# Patient Record
Sex: Female | Born: 1940 | Race: White | Hispanic: No | Smoking: Former smoker
Health system: Southern US, Community
[De-identification: ages and names within clinical notes are randomized; demographics above are authoritative.]

## PROBLEM LIST (undated history)

## (undated) DIAGNOSIS — E041 Nontoxic single thyroid nodule: Secondary | ICD-10-CM

## (undated) DIAGNOSIS — E785 Hyperlipidemia, unspecified: Secondary | ICD-10-CM

## (undated) DIAGNOSIS — K2210 Ulcer of esophagus without bleeding: Secondary | ICD-10-CM

## (undated) DIAGNOSIS — M1990 Unspecified osteoarthritis, unspecified site: Secondary | ICD-10-CM

## (undated) DIAGNOSIS — Z8679 Personal history of other diseases of the circulatory system: Secondary | ICD-10-CM

## (undated) DIAGNOSIS — I2510 Atherosclerotic heart disease of native coronary artery without angina pectoris: Secondary | ICD-10-CM

## (undated) DIAGNOSIS — M549 Dorsalgia, unspecified: Secondary | ICD-10-CM

## (undated) DIAGNOSIS — Z860100 Personal history of colon polyps, unspecified: Secondary | ICD-10-CM

## (undated) DIAGNOSIS — C4490 Unspecified malignant neoplasm of skin, unspecified: Secondary | ICD-10-CM

## (undated) DIAGNOSIS — I340 Nonrheumatic mitral (valve) insufficiency: Secondary | ICD-10-CM

## (undated) DIAGNOSIS — I509 Heart failure, unspecified: Secondary | ICD-10-CM

## (undated) DIAGNOSIS — K219 Gastro-esophageal reflux disease without esophagitis: Secondary | ICD-10-CM

## (undated) DIAGNOSIS — N393 Stress incontinence (female) (male): Secondary | ICD-10-CM

## (undated) DIAGNOSIS — N189 Chronic kidney disease, unspecified: Secondary | ICD-10-CM

## (undated) DIAGNOSIS — Z9889 Other specified postprocedural states: Secondary | ICD-10-CM

## (undated) DIAGNOSIS — I219 Acute myocardial infarction, unspecified: Secondary | ICD-10-CM

## (undated) DIAGNOSIS — R0600 Dyspnea, unspecified: Secondary | ICD-10-CM

## (undated) DIAGNOSIS — K52839 Microscopic colitis, unspecified: Secondary | ICD-10-CM

## (undated) DIAGNOSIS — C801 Malignant (primary) neoplasm, unspecified: Secondary | ICD-10-CM

## (undated) DIAGNOSIS — K760 Fatty (change of) liver, not elsewhere classified: Secondary | ICD-10-CM

## (undated) DIAGNOSIS — R002 Palpitations: Secondary | ICD-10-CM

## (undated) DIAGNOSIS — C189 Malignant neoplasm of colon, unspecified: Secondary | ICD-10-CM

## (undated) DIAGNOSIS — R112 Nausea with vomiting, unspecified: Secondary | ICD-10-CM

## (undated) DIAGNOSIS — G8929 Other chronic pain: Secondary | ICD-10-CM

## (undated) HISTORY — PX: APPENDECTOMY: SHX54

## (undated) HISTORY — PX: BIOPSY THYROID: PRO38

## (undated) HISTORY — PX: COLONOSCOPY: SHX174

## (undated) HISTORY — PX: CARDIAC CATHETERIZATION: SHX172

## (undated) HISTORY — PX: ESOPHAGOGASTRODUODENOSCOPY: SHX1529

## (undated) HISTORY — PX: CORONARY ANGIOPLASTY: SHX604

---

## 1987-04-20 DIAGNOSIS — I219 Acute myocardial infarction, unspecified: Secondary | ICD-10-CM

## 1987-04-20 HISTORY — PX: ROTATOR CUFF REPAIR: SHX139

## 1987-04-20 HISTORY — DX: Acute myocardial infarction, unspecified: I21.9

## 1988-04-19 HISTORY — PX: ANKLE SURGERY: SHX546

## 1999-04-20 HISTORY — PX: ROTATOR CUFF REPAIR: SHX139

## 2002-04-19 HISTORY — PX: CARPAL TUNNEL RELEASE: SHX101

## 2006-04-19 HISTORY — PX: OTHER SURGICAL HISTORY: SHX169

## 2009-04-19 HISTORY — PX: BACK SURGERY: SHX140

## 2010-02-26 ENCOUNTER — Encounter: Admission: RE | Admit: 2010-02-26 | Discharge: 2010-02-26 | Payer: Self-pay | Admitting: Neurosurgery

## 2010-03-04 ENCOUNTER — Inpatient Hospital Stay (HOSPITAL_COMMUNITY): Admission: RE | Admit: 2010-03-04 | Discharge: 2010-03-05 | Payer: Self-pay | Admitting: Neurosurgery

## 2010-04-19 HISTORY — PX: TOTAL ANKLE REPLACEMENT: SUR1218

## 2010-04-19 HISTORY — PX: SPINAL FUSION: SHX223

## 2010-04-23 ENCOUNTER — Encounter
Admission: RE | Admit: 2010-04-23 | Discharge: 2010-04-23 | Payer: Self-pay | Source: Home / Self Care | Attending: Neurosurgery | Admitting: Neurosurgery

## 2010-06-24 ENCOUNTER — Encounter (HOSPITAL_COMMUNITY)
Admission: RE | Admit: 2010-06-24 | Discharge: 2010-06-24 | Disposition: A | Discharge status: Home / Self Care | Payer: Medicare Other | Source: Ambulatory Visit | Attending: Neurosurgery | Admitting: Neurosurgery

## 2010-06-24 LAB — CBC
HCT: 43 % (ref 36.0–46.0)
MCHC: 33.3 g/dL (ref 30.0–36.0)
MCV: 90.7 fL (ref 78.0–100.0)
RDW: 13.9 % (ref 11.5–15.5)

## 2010-06-24 LAB — COMPREHENSIVE METABOLIC PANEL
Alkaline Phosphatase: 89 U/L (ref 39–117)
BUN: 21 mg/dL (ref 6–23)
Calcium: 9.6 mg/dL (ref 8.4–10.5)
Glucose, Bld: 100 mg/dL — ABNORMAL HIGH (ref 70–99)
Total Protein: 6.8 g/dL (ref 6.0–8.3)

## 2010-06-24 LAB — SURGICAL PCR SCREEN
MRSA, PCR: NEGATIVE
Staphylococcus aureus: NEGATIVE

## 2010-06-26 ENCOUNTER — Inpatient Hospital Stay (HOSPITAL_COMMUNITY): Payer: Medicare Other

## 2010-06-26 ENCOUNTER — Inpatient Hospital Stay (HOSPITAL_COMMUNITY)
Admission: RE | Admit: 2010-06-26 | Discharge: 2010-07-06 | DRG: 460 | Disposition: A | Discharge status: Home Health | Payer: Medicare Other | Source: Ambulatory Visit | Attending: Neurosurgery | Admitting: Neurosurgery

## 2010-06-26 DIAGNOSIS — T84498A Other mechanical complication of other internal orthopedic devices, implants and grafts, initial encounter: Principal | ICD-10-CM | POA: Diagnosis present

## 2010-06-26 DIAGNOSIS — Z01818 Encounter for other preprocedural examination: Secondary | ICD-10-CM

## 2010-06-26 DIAGNOSIS — Y831 Surgical operation with implant of artificial internal device as the cause of abnormal reaction of the patient, or of later complication, without mention of misadventure at the time of the procedure: Secondary | ICD-10-CM | POA: Diagnosis present

## 2010-06-26 DIAGNOSIS — M51379 Other intervertebral disc degeneration, lumbosacral region without mention of lumbar back pain or lower extremity pain: Secondary | ICD-10-CM | POA: Diagnosis present

## 2010-06-26 DIAGNOSIS — Z01812 Encounter for preprocedural laboratory examination: Secondary | ICD-10-CM

## 2010-06-26 DIAGNOSIS — M5137 Other intervertebral disc degeneration, lumbosacral region: Secondary | ICD-10-CM | POA: Diagnosis present

## 2010-06-28 ENCOUNTER — Inpatient Hospital Stay (HOSPITAL_COMMUNITY): Payer: Medicare Other

## 2010-06-30 LAB — CBC
HCT: 41.5 % (ref 36.0–46.0)
RDW: 13.9 % (ref 11.5–15.5)
WBC: 7 10*3/uL (ref 4.0–10.5)

## 2010-06-30 LAB — URINALYSIS, ROUTINE W REFLEX MICROSCOPIC
Ketones, ur: NEGATIVE mg/dL
Nitrite: NEGATIVE
Protein, ur: NEGATIVE mg/dL
Urobilinogen, UA: 0.2 mg/dL (ref 0.0–1.0)
pH: 6 (ref 5.0–8.0)

## 2010-06-30 LAB — BASIC METABOLIC PANEL
BUN: 12 mg/dL (ref 6–23)
Calcium: 9.2 mg/dL (ref 8.4–10.5)
GFR calc non Af Amer: 54 mL/min — ABNORMAL LOW (ref >60)
Potassium: 4.6 mEq/L (ref 3.5–5.1)
Sodium: 137 mEq/L (ref 135–145)

## 2010-06-30 LAB — SURGICAL PCR SCREEN
MRSA, PCR: NEGATIVE
Staphylococcus aureus: NEGATIVE

## 2010-07-09 NOTE — Op Note (Signed)
NAMEYURIKO, Cooke NO.:  [id number]  MEDICAL RECORD NO.:  [id number]           PATIENT TYPE:  I  LOCATION:  3020                         FACILITY:  [facility]  PHYSICIAN:  Coletta Memos, M.D.     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  PROCEDURE:  Right redo laminectomy and diskectomy L4-5, unintended durotomy, repaired primarily.  FINDINGS:  Very degenerated disk more than likely unstable disk space. Discussed with family member about intraoperative fusion which was my recommendation.  Her son felt that she will one to make that decision on her own which was also perfectly fine.  OPERATIVE NOTE:  Erin Cooke was brought to the operating room, intubated, and placed under general anesthetic.  She was rolled prone onto a Wilson frame and all pressure points were properly padded.  Her back was prepped.  She was draped in a sterile fashion.  I opened the old incision and took this down to the thoracolumbar fascia.  I then exposed the lamina of what I believed to be my incision.  I then placed a double-ended ganglion knife inferior to the lamina for localization purposes, and after the x-ray was performed I removed the double-ended ganglion knife and spinal fluid was emanating from that opening in the scar tissue.  As I had not yet obtained exposure, I then just went superiorly on the lamina, removed more material.  I was then able to identify the ligamentum flavum.  I then also did remove the spinous process and performed was very small amount of laminectomy to gain better exposure for the repair.  With Dr. Fredrich Birks assistance, removed that and then the ligamentum flavum, and saw the hole in the dura.  I was able to repair that with two  Prolene sutures without difficulty.  We then retracted the thecal sac, identified the disk space, and removed a great deal of disk material, but it was obvious at that point just from my manipulation of the disk space that she was quite unstable.  The disk was so degenerated as it was a first time.  We removed whenever disk was remaining.  Was at great risk for recurrent herniation.  At that point, Dr. Venetia Maxon remained in the room and I broke scrub.  I spoke to her son and expressed my feeling that she should be fused in order to prevent another recurrence.  She had an early recurrence at this time.  He felt that she really would want to make that decision for herself and that is why we spoke to him, so I then came back to the room, and after full decompression then closed the wound in layered fashion.  I placed DuraSeal.  I then reapproximated the thoracolumbar subcutaneous and subcuticular layers with Vicryls and the skin edges with a 3-0 nylon suture.  Sterile dressing was applied.         ______________________________ Coletta Memos, M.D.  KC/MEDQ  D:  06/26/2010  T:  06/27/2010  Job:  [id number]  Electronically Signed by Coletta Memos M.D. on 07/09/2010 02:36:57 PM

## 2010-07-15 NOTE — Op Note (Signed)
Erin Cooke, Erin Cooke NO.:  [id number]  MEDICAL RECORD NO.:  [id number]           PATIENT TYPE:  I  LOCATION:  3020                         FACILITY:  [facility]  PHYSICIAN:  Coletta Memos, M.D.     DATE OF BIRTH:  09-18-1940  DATE OF PROCEDURE:  06/28/2010 DATE OF DISCHARGE:  07/06/2010                              OPERATIVE REPORT   PREOPERATIVE DIAGNOSES:  Lumbar instability, lumbar spondylosis without myelopathy, lumbar degenerative disk disease, and recurrent disk herniation.  POSTOPERATIVE DIAGNOSES:  Lumbar instability, lumbar spondylosis without myelopathy, lumbar degenerative disk disease, and recurrent disk herniation.  PROCEDURE:  Stage II of a lumbar fusion.  She underwent: 1. Posterior lumbar interbody arthrodesis, L4-5. 2. Posterolateral arthrodesis L4-5 morselized allograft. 3. Nonsegmental pedicle screw instrumentation with Medtronic I believe     CD Horizon.  OPERATIVE NOTE:  [name] is having a planned stage II procedure. While in the operating room on June 26, 2010, I felt that at that time that a fusion would be in her best interest.  The disk was markedly degenerated, significant amount of laxity in the facets and I just thought that she needed to be fused.  We, however, had not spoken about that during that time.  I went to ask her son though obviously on the surgical consent I have authorization to do what I believe is necessary. The sons requested that I have [name] be awakened so that she could make a decision about a fusion on her own.  I honored their request anticipating that she would.  This is a stage II of this operation.  [name] was brought to the operating room.  She was intubated and placed under general anesthetic without difficulty.  She was rolled prone onto a Wilson frame and all pressure points were properly padded. Her back was prepped and I opened the old incision.  I took this down to the thoracolumbar  fascia, then exposed lamina of L4 and L5 bilaterally and along the transverse processes bilaterally with Dr. Fredrich Birks assistance.  I completed the diskectomy and placed a PEEK interbody cage 12 x 24 Synthes cage into the disk space.  After placement of the cage with Dr. Fredrich Birks assistance, we then placed pedicle screws, 2 screws in L4 and 2 screws in L5.  The right-sided L5 screw is slightly lateral but appeared to be within the vertebral body.  I did not believe it was worthwhile to take the screw out so I therefore connected it.  She had good purchase with all 4 screws.  X-ray showed screws in the interbody to be in good position.  I then irrigated.  I then placed more DuraSeal over the previous durotomy opening that had been primarily repaired.  I then closed the wound in layered fashion using Vicryl sutures to reapproximate the thoracolumbar subcutaneous layers.  I used a 3-0 nylon to reapproximate the skin edges with interrupted vertical mattress sutures.          ______________________________ Coletta Memos, M.D.     KC/MEDQ  D:  07/13/2010  T:  07/14/2010  Job:  [id number]  Electronically Signed by  Coletta Memos M.D. on 07/15/2010 03:45:48 PM

## 2010-08-05 NOTE — Discharge Summary (Signed)
  [name], [name] NO.:  [id number]  MEDICAL RECORD NO.:  [id number]           PATIENT TYPE:  I  LOCATION:  3020                         FACILITY:  [facility]  PHYSICIAN:  Coletta Memos, M.D.     DATE OF BIRTH:  1940/06/15  DATE OF ADMISSION:  06/26/2010 DATE OF DISCHARGE:  07/06/2010                              DISCHARGE SUMMARY   ADMITTING DIAGNOSIS:  Recurrent displaced disk L4-5.  PROCEDURE:  Posterior lumbar interbody arthrodesis L4-5 with morselized autograft, pedicle screw instrumentation.  This was a planned stage procedure after the first operation because I felt that spine was not stable.  I will not discuss this with the patient so we needed to wake her up and then she underwent the procedure.  DISCHARGE STATUS:  Alive and well.  DISCHARGE DESTINATION:  Home.  WOUND:  Still had slight amount of drainage.  A sterile dressing was applied in the form of Dermabond after a stitch was placed during her hospitalization.  She did very well after her procedure and I will see her in 3-4 weeks.  She was given instructions of no heavy lifting, bending or twisting.  There were no signs of infection in the wound though I did discharge her with antibiotic.  She was voiding and ambulating and tolerating a regular diet.  I will see her again in 3-4 weeks.          ______________________________ Coletta Memos, M.D.     KC/MEDQ  D:  07/23/2010  T:  07/24/2010  Job:  [id number]  Electronically Signed by Coletta Memos M.D. on 08/05/2010 04:13:25 PM

## 2011-04-20 HISTORY — PX: MASTECTOMY, PARTIAL: SHX709

## 2012-04-19 HISTORY — PX: DILATION AND CURETTAGE OF UTERUS: SHX78

## 2012-04-19 HISTORY — PX: OTHER SURGICAL HISTORY: SHX169

## 2013-09-18 ENCOUNTER — Other Ambulatory Visit: Payer: Self-pay | Admitting: Neurosurgery

## 2013-10-03 ENCOUNTER — Encounter (HOSPITAL_COMMUNITY)
Admission: RE | Admit: 2013-10-03 | Discharge: 2013-10-03 | Disposition: A | Discharge status: Home / Self Care | Payer: Medicare Other | Source: Ambulatory Visit | Attending: Neurosurgery | Admitting: Neurosurgery

## 2013-10-03 ENCOUNTER — Encounter (HOSPITAL_COMMUNITY)
Admission: RE | Admit: 2013-10-03 | Discharge: 2013-10-03 | Disposition: A | Discharge status: Home / Self Care | Payer: Medicare Other | Source: Ambulatory Visit | Attending: Anesthesiology | Admitting: Anesthesiology

## 2013-10-03 ENCOUNTER — Encounter (HOSPITAL_COMMUNITY): Payer: Self-pay

## 2013-10-03 DIAGNOSIS — Z01818 Encounter for other preprocedural examination: Secondary | ICD-10-CM | POA: Insufficient documentation

## 2013-10-03 DIAGNOSIS — Z01812 Encounter for preprocedural laboratory examination: Secondary | ICD-10-CM | POA: Insufficient documentation

## 2013-10-03 HISTORY — DX: Ulcer of esophagus without bleeding: K22.10

## 2013-10-03 HISTORY — DX: Nausea with vomiting, unspecified: R11.2

## 2013-10-03 HISTORY — DX: Other specified postprocedural states: Z98.890

## 2013-10-03 HISTORY — DX: Unspecified osteoarthritis, unspecified site: M19.90

## 2013-10-03 HISTORY — DX: Stress incontinence (female) (male): N39.3

## 2013-10-03 HISTORY — DX: Palpitations: R00.2

## 2013-10-03 HISTORY — DX: Atherosclerotic heart disease of native coronary artery without angina pectoris: I25.10

## 2013-10-03 HISTORY — DX: Personal history of colon polyps, unspecified: Z86.0100

## 2013-10-03 HISTORY — DX: Dorsalgia, unspecified: M54.9

## 2013-10-03 HISTORY — DX: Hyperlipidemia, unspecified: E78.5

## 2013-10-03 HISTORY — DX: Microscopic colitis, unspecified: K52.839

## 2013-10-03 HISTORY — DX: Gastro-esophageal reflux disease without esophagitis: K21.9

## 2013-10-03 HISTORY — DX: Personal history of colonic polyps: Z86.010

## 2013-10-03 HISTORY — DX: Acute myocardial infarction, unspecified: I21.9

## 2013-10-03 HISTORY — DX: Nontoxic single thyroid nodule: E04.1

## 2013-10-03 HISTORY — DX: Malignant (primary) neoplasm, unspecified: C80.1

## 2013-10-03 HISTORY — DX: Other chronic pain: G89.29

## 2013-10-03 LAB — BASIC METABOLIC PANEL
BUN: 19 mg/dL (ref 6–23)
CALCIUM: 9.4 mg/dL (ref 8.4–10.5)
CO2: 26 mEq/L (ref 19–32)
Chloride: 102 mEq/L (ref 96–112)
Creatinine, Ser: 0.95 mg/dL (ref 0.50–1.10)
GFR calc Af Amer: 66 mL/min — ABNORMAL LOW (ref >90)
GFR calc non Af Amer: 57 mL/min — ABNORMAL LOW (ref >90)
GLUCOSE: 89 mg/dL (ref 70–99)
Potassium: 4.4 mEq/L (ref 3.7–5.3)
Sodium: 141 mEq/L (ref 137–147)

## 2013-10-03 LAB — CBC
HCT: 40.2 % (ref 36.0–46.0)
HEMOGLOBIN: 12.8 g/dL (ref 12.0–15.0)
MCH: 28.1 pg (ref 26.0–34.0)
MCHC: 31.8 g/dL (ref 30.0–36.0)
MCV: 88.4 fL (ref 78.0–100.0)
Platelets: 217 10*3/uL (ref 150–400)
RBC: 4.55 MIL/uL (ref 3.87–5.11)
RDW: 14.8 % (ref 11.5–15.5)
WBC: 7.7 10*3/uL (ref 4.0–10.5)

## 2013-10-03 LAB — SURGICAL PCR SCREEN
MRSA, PCR: NEGATIVE
Staphylococcus aureus: POSITIVE — AB

## 2013-10-03 LAB — TYPE AND SCREEN
ABO/RH(D): O POS
Antibody Screen: NEGATIVE

## 2013-10-03 LAB — ABO/RH: ABO/RH(D): O POS

## 2013-10-03 NOTE — Progress Notes (Signed)
Cardiac protector takes Losartan but insists no HTN

## 2013-10-03 NOTE — Pre-Procedure Instructions (Addendum)
Erin Cooke  10/03/2013   Your procedure is scheduled on:  Wed, June 24 @ 9:00 AM  Report to [facility]  at 6:00 AM.  Call this number if you have problems the morning of surgery: [phone]   Remember:   Do not eat food or drink liquids after midnight.   Take these medicines the morning of surgery with A SIP OF WATER: Betapace(Sotalol),Eye Drops,and Omeprazole(Prilosec)   Stop taking your Aspirin.No Goody's,BC's,Aleve,Fish Oil,or any Herbal Medications   Do not wear jewelry, make-up or nail polish.  Do not wear lotions, powders, or perfumes. You may wear deodorant.  Do not shave 48 hours prior to surgery.   Do not bring valuables to the hospital.  [facility] is not responsible                  for any belongings or valuables.               Contacts, dentures or bridgework may not be worn into surgery.  Leave suitcase in the car. After surgery it may be brought to your room.  For patients admitted to the hospital, discharge time is determined by your                treatment team.                   Special Instructions:  [place] - Preparing for Surgery  Before surgery, you can play an important role.  Because skin is not sterile, your skin needs to be as free of germs as possible.  You can reduce the number of germs on you skin by washing with CHG (chlorahexidine gluconate) soap before surgery.  CHG is an antiseptic cleaner which kills germs and bonds with the skin to continue killing germs even after washing.  Please DO NOT use if you have an allergy to CHG or antibacterial soaps.  If your skin becomes reddened/irritated stop using the CHG and inform your nurse when you arrive at Short Stay.  Do not shave (including legs and underarms) for at least 48 hours prior to the first CHG shower.  You may shave your face.  Please follow these instructions carefully:   1.  Shower with CHG Soap the night before surgery and the                                morning  of Surgery.  2.  If you choose to wash your hair, wash your hair first as usual with your       normal shampoo.  3.  After you shampoo, rinse your hair and body thoroughly to remove the                      Shampoo.  4.  Use CHG as you would any other liquid soap.  You can apply chg directly       to the skin and wash gently with scrungie or a clean washcloth.  5.  Apply the CHG Soap to your body ONLY FROM THE NECK DOWN.        Do not use on open wounds or open sores.  Avoid contact with your eyes,       ears, mouth and genitals (private parts).  Wash genitals (private parts)       with your normal soap.  6.  Wash thoroughly, paying  special attention to the area where your surgery        will be performed.  7.  Thoroughly rinse your body with warm water from the neck down.  8.  DO NOT shower/wash with your normal soap after using and rinsing off       the CHG Soap.  9.  Pat yourself dry with a clean towel.            10.  Wear clean pajamas.            11.  Place clean sheets on your bed the night of your first shower and do not        sleep with pets.  Day of Surgery  Do not apply any lotions/deoderants the morning of surgery.  Please wear clean clothes to the hospital/surgery center.     Please read over the following fact sheets that you were given: Pain Booklet, Coughing and Deep Breathing, Blood Transfusion Information, MRSA Information and Surgical Site Infection Prevention

## 2013-10-03 NOTE — Progress Notes (Signed)
Dr.Zachary in [place] is cardiologist-to request visit from about 3wks ago  Heart cath done in 1999-[facility]  Stress test to be requesed from [place] from 2013 as well as echo  Pt states she sees Dr.Zachary for everything  Denies CXR in past yr

## 2013-10-09 MED ORDER — CEFAZOLIN SODIUM-DEXTROSE 2-3 GM-% IV SOLR
2.0000 g | INTRAVENOUS | Status: AC
  Administered 2013-10-10: 2 g via INTRAVENOUS

## 2013-10-10 ENCOUNTER — Inpatient Hospital Stay (HOSPITAL_COMMUNITY): Payer: Medicare Other

## 2013-10-10 ENCOUNTER — Encounter (HOSPITAL_COMMUNITY)
Admission: RE | Disposition: A | Discharge status: Home / Self Care | Payer: Self-pay | Source: Ambulatory Visit | Attending: Neurosurgery

## 2013-10-10 ENCOUNTER — Inpatient Hospital Stay (HOSPITAL_COMMUNITY)
Admission: RE | Admit: 2013-10-10 | Discharge: 2013-10-13 | DRG: 460 | Disposition: A | Discharge status: Home / Self Care | Payer: Medicare Other | Source: Ambulatory Visit | Attending: Neurosurgery | Admitting: Neurosurgery

## 2013-10-10 ENCOUNTER — Inpatient Hospital Stay (HOSPITAL_COMMUNITY): Payer: Medicare Other | Admitting: Certified Registered Nurse Anesthetist

## 2013-10-10 ENCOUNTER — Encounter (HOSPITAL_COMMUNITY): Payer: Medicare Other | Admitting: Certified Registered Nurse Anesthetist

## 2013-10-10 ENCOUNTER — Encounter (HOSPITAL_COMMUNITY): Payer: Self-pay | Admitting: Certified Registered Nurse Anesthetist

## 2013-10-10 DIAGNOSIS — M7138 Other bursal cyst, other site: Secondary | ICD-10-CM | POA: Diagnosis present

## 2013-10-10 DIAGNOSIS — Z6839 Body mass index (BMI) 39.0-39.9, adult: Secondary | ICD-10-CM

## 2013-10-10 DIAGNOSIS — M7130 Other bursal cyst, unspecified site: Principal | ICD-10-CM | POA: Diagnosis present

## 2013-10-10 DIAGNOSIS — I252 Old myocardial infarction: Secondary | ICD-10-CM

## 2013-10-10 DIAGNOSIS — M48061 Spinal stenosis, lumbar region without neurogenic claudication: Secondary | ICD-10-CM | POA: Diagnosis present

## 2013-10-10 DIAGNOSIS — I10 Essential (primary) hypertension: Secondary | ICD-10-CM | POA: Diagnosis present

## 2013-10-10 DIAGNOSIS — Z853 Personal history of malignant neoplasm of breast: Secondary | ICD-10-CM

## 2013-10-10 DIAGNOSIS — I2510 Atherosclerotic heart disease of native coronary artery without angina pectoris: Secondary | ICD-10-CM | POA: Diagnosis present

## 2013-10-10 SURGERY — POSTERIOR LUMBAR FUSION 1 LEVEL
Anesthesia: General | Site: Back

## 2013-10-10 MED ORDER — LIDOCAINE HCL (CARDIAC) 20 MG/ML IV SOLN
INTRAVENOUS | Status: AC
  Filled 2013-10-10: qty 5

## 2013-10-10 MED ORDER — LACTATED RINGERS IV SOLN
INTRAVENOUS | Status: DC
  Administered 2013-10-10: 07:00:00 via INTRAVENOUS

## 2013-10-10 MED ORDER — OXYCODONE HCL 5 MG PO TABS: 5.0000 mg | ORAL_TABLET | Freq: Once | ORAL | Status: DC | PRN

## 2013-10-10 MED ORDER — FENTANYL CITRATE 0.05 MG/ML IJ SOLN
INTRAMUSCULAR | Status: DC | PRN
  Administered 2013-10-10 (×2): 100 ug via INTRAVENOUS
  Administered 2013-10-10: 50 ug via INTRAVENOUS
  Administered 2013-10-10: 250 ug via INTRAVENOUS
  Administered 2013-10-10 (×2): 50 ug via INTRAVENOUS

## 2013-10-10 MED ORDER — ATORVASTATIN CALCIUM 80 MG PO TABS
80.0000 mg | ORAL_TABLET | Freq: Every day | ORAL | Status: DC
  Administered 2013-10-10 – 2013-10-12 (×3): 80 mg via ORAL
  Filled 2013-10-10 (×3): qty 1

## 2013-10-10 MED ORDER — SUCCINYLCHOLINE CHLORIDE 20 MG/ML IJ SOLN
INTRAMUSCULAR | Status: AC
  Filled 2013-10-10: qty 1

## 2013-10-10 MED ORDER — PROPOFOL INFUSION 10 MG/ML OPTIME
INTRAVENOUS | Status: DC | PRN
  Administered 2013-10-10: 100 ug/kg/min via INTRAVENOUS

## 2013-10-10 MED ORDER — FENTANYL CITRATE 0.05 MG/ML IJ SOLN
INTRAMUSCULAR | Status: AC
  Filled 2013-10-10: qty 5

## 2013-10-10 MED ORDER — ONDANSETRON HCL 4 MG/2ML IJ SOLN
4.0000 mg | INTRAMUSCULAR | Status: DC | PRN
  Administered 2013-10-13: 4 mg via INTRAVENOUS
  Filled 2013-10-10: qty 2

## 2013-10-10 MED ORDER — LIDOCAINE-EPINEPHRINE 0.5 %-1:200000 IJ SOLN
INTRAMUSCULAR | Status: DC | PRN
  Administered 2013-10-10: 8 mL

## 2013-10-10 MED ORDER — ACETAMINOPHEN 325 MG PO TABS: 650.0000 mg | ORAL_TABLET | ORAL | Status: DC | PRN

## 2013-10-10 MED ORDER — ARTIFICIAL TEARS OP OINT
TOPICAL_OINTMENT | OPHTHALMIC | Status: DC | PRN
  Administered 2013-10-10: 1 via OPHTHALMIC

## 2013-10-10 MED ORDER — EPHEDRINE SULFATE 50 MG/ML IJ SOLN
INTRAMUSCULAR | Status: DC | PRN
  Administered 2013-10-10 (×2): 10 mg via INTRAVENOUS
  Administered 2013-10-10: 5 mg via INTRAVENOUS

## 2013-10-10 MED ORDER — CALCIUM CARBONATE-VITAMIN D 500-200 MG-UNIT PO TABS
1.0000 | ORAL_TABLET | Freq: Two times a day (BID) | ORAL | Status: DC
  Administered 2013-10-11 – 2013-10-13 (×3): 1 via ORAL
  Filled 2013-10-10 (×4): qty 1

## 2013-10-10 MED ORDER — PROPOFOL 10 MG/ML IV BOLUS
INTRAVENOUS | Status: AC
  Filled 2013-10-10: qty 20

## 2013-10-10 MED ORDER — GLYCOPYRROLATE 0.2 MG/ML IJ SOLN
INTRAMUSCULAR | Status: DC | PRN
  Administered 2013-10-10: 0.6 mg via INTRAVENOUS

## 2013-10-10 MED ORDER — POTASSIUM CHLORIDE IN NACL 20-0.9 MEQ/L-% IV SOLN
INTRAVENOUS | Status: DC
  Administered 2013-10-10: 19:00:00 via INTRAVENOUS
  Filled 2013-10-10 (×7): qty 1000

## 2013-10-10 MED ORDER — LACTATED RINGERS IV SOLN
INTRAVENOUS | Status: DC | PRN
  Administered 2013-10-10 (×2): via INTRAVENOUS

## 2013-10-10 MED ORDER — MENTHOL 3 MG MT LOZG
1.0000 | LOZENGE | OROMUCOSAL | Status: DC | PRN
Start: 2013-10-10 — End: 2013-10-13
  Administered 2013-10-11: 3 mg via ORAL
  Filled 2013-10-10: qty 9

## 2013-10-10 MED ORDER — ROCURONIUM BROMIDE 50 MG/5ML IV SOLN
INTRAVENOUS | Status: AC
  Filled 2013-10-10: qty 1

## 2013-10-10 MED ORDER — ONDANSETRON HCL 4 MG/2ML IJ SOLN
INTRAMUSCULAR | Status: DC | PRN
  Administered 2013-10-10: 4 mg via INTRAVENOUS

## 2013-10-10 MED ORDER — SOTALOL HCL 80 MG PO TABS
80.0000 mg | ORAL_TABLET | Freq: Two times a day (BID) | ORAL | Status: DC
  Administered 2013-10-10 – 2013-10-13 (×6): 80 mg via ORAL
  Filled 2013-10-10 (×7): qty 1

## 2013-10-10 MED ORDER — NEOSTIGMINE METHYLSULFATE 10 MG/10ML IV SOLN
INTRAVENOUS | Status: DC | PRN
  Administered 2013-10-10: 4 mg via INTRAVENOUS

## 2013-10-10 MED ORDER — NEOSTIGMINE METHYLSULFATE 10 MG/10ML IV SOLN
INTRAVENOUS | Status: AC
  Filled 2013-10-10: qty 2

## 2013-10-10 MED ORDER — SODIUM CHLORIDE 0.9 % IJ SOLN
3.0000 mL | Freq: Two times a day (BID) | INTRAMUSCULAR | Status: DC
  Administered 2013-10-11 – 2013-10-12 (×3): 3 mL via INTRAVENOUS

## 2013-10-10 MED ORDER — CALCIUM CITRATE-VITAMIN D 500-400 MG-UNIT PO CHEW
0.5000 | CHEWABLE_TABLET | Freq: Two times a day (BID) | ORAL | Status: DC
  Filled 2013-10-10 (×2): qty 0.5

## 2013-10-10 MED ORDER — SENNA 8.6 MG PO TABS
1.0000 | ORAL_TABLET | Freq: Two times a day (BID) | ORAL | Status: DC
  Administered 2013-10-11 – 2013-10-13 (×5): 8.6 mg via ORAL
  Filled 2013-10-10 (×6): qty 1

## 2013-10-10 MED ORDER — MIDAZOLAM HCL 2 MG/2ML IJ SOLN
INTRAMUSCULAR | Status: AC
  Filled 2013-10-10: qty 2

## 2013-10-10 MED ORDER — ADULT MULTIVITAMIN W/MINERALS CH
1.0000 | ORAL_TABLET | Freq: Every day | ORAL | Status: DC
  Administered 2013-10-11 – 2013-10-13 (×3): 1 via ORAL
  Filled 2013-10-10 (×3): qty 1

## 2013-10-10 MED ORDER — DEXAMETHASONE SODIUM PHOSPHATE 4 MG/ML IJ SOLN
INTRAMUSCULAR | Status: DC | PRN
  Administered 2013-10-10: 8 mg via INTRAVENOUS

## 2013-10-10 MED ORDER — MEPERIDINE HCL 25 MG/ML IJ SOLN: 6.2500 mg | INTRAMUSCULAR | Status: DC | PRN

## 2013-10-10 MED ORDER — ASPIRIN EC 81 MG PO TBEC
81.0000 mg | DELAYED_RELEASE_TABLET | Freq: Every day | ORAL | Status: DC
  Administered 2013-10-10 – 2013-10-12 (×3): 81 mg via ORAL
  Filled 2013-10-10 (×4): qty 1

## 2013-10-10 MED ORDER — ONDANSETRON HCL 4 MG/2ML IJ SOLN
INTRAMUSCULAR | Status: AC
  Filled 2013-10-10: qty 2

## 2013-10-10 MED ORDER — PROPOFOL 10 MG/ML IV BOLUS
INTRAVENOUS | Status: DC | PRN
  Administered 2013-10-10: 130 mg via INTRAVENOUS

## 2013-10-10 MED ORDER — DEXAMETHASONE SODIUM PHOSPHATE 4 MG/ML IJ SOLN
INTRAMUSCULAR | Status: AC
  Filled 2013-10-10: qty 2

## 2013-10-10 MED ORDER — POLYETHYLENE GLYCOL 3350 17 G PO PACK
17.0000 g | PACK | Freq: Every day | ORAL | Status: DC | PRN
  Administered 2013-10-12: 17 g via ORAL
  Filled 2013-10-10: qty 1

## 2013-10-10 MED ORDER — PREDNISOLONE ACETATE 1 % OP SUSP
1.0000 [drp] | Freq: Every day | OPHTHALMIC | Status: DC
  Filled 2013-10-10: qty 1

## 2013-10-10 MED ORDER — ONDANSETRON HCL 4 MG/2ML IJ SOLN: 4.0000 mg | Freq: Once | INTRAMUSCULAR | Status: DC | PRN

## 2013-10-10 MED ORDER — CALCIUM CITRATE-VITAMIN D 315-200 MG-UNIT PO TABS: 1.0000 | ORAL_TABLET | Freq: Two times a day (BID) | ORAL | Status: DC

## 2013-10-10 MED ORDER — MIDAZOLAM HCL 5 MG/5ML IJ SOLN
INTRAMUSCULAR | Status: DC | PRN
  Administered 2013-10-10 (×2): 2 mg via INTRAVENOUS

## 2013-10-10 MED ORDER — LOSARTAN POTASSIUM 50 MG PO TABS
50.0000 mg | ORAL_TABLET | Freq: Every day | ORAL | Status: DC
  Administered 2013-10-11 – 2013-10-13 (×3): 50 mg via ORAL
  Filled 2013-10-10 (×4): qty 1

## 2013-10-10 MED ORDER — EPHEDRINE SULFATE 50 MG/ML IJ SOLN
INTRAMUSCULAR | Status: AC
  Filled 2013-10-10: qty 1

## 2013-10-10 MED ORDER — ARTIFICIAL TEARS OP OINT
TOPICAL_OINTMENT | OPHTHALMIC | Status: AC
  Filled 2013-10-10: qty 3.5

## 2013-10-10 MED ORDER — PHENOL 1.4 % MT LIQD: 1.0000 | OROMUCOSAL | Status: DC | PRN

## 2013-10-10 MED ORDER — CEFAZOLIN SODIUM 1-5 GM-% IV SOLN
1.0000 g | Freq: Three times a day (TID) | INTRAVENOUS | Status: AC
  Administered 2013-10-10 – 2013-10-11 (×2): 1 g via INTRAVENOUS
  Filled 2013-10-10 (×3): qty 50

## 2013-10-10 MED ORDER — ACETAMINOPHEN 650 MG RE SUPP: 650.0000 mg | RECTAL | Status: DC | PRN

## 2013-10-10 MED ORDER — OMEPRAZOLE MAGNESIUM 20 MG PO TBEC: 20.0000 mg | DELAYED_RELEASE_TABLET | Freq: Every morning | ORAL | Status: DC

## 2013-10-10 MED ORDER — PANTOPRAZOLE SODIUM 40 MG PO TBEC
40.0000 mg | DELAYED_RELEASE_TABLET | Freq: Every day | ORAL | Status: DC
  Administered 2013-10-11 – 2013-10-13 (×3): 40 mg via ORAL
  Filled 2013-10-10 (×3): qty 1

## 2013-10-10 MED ORDER — HYDROMORPHONE HCL PF 1 MG/ML IJ SOLN
INTRAMUSCULAR | Status: AC
  Filled 2013-10-10: qty 1

## 2013-10-10 MED ORDER — STERILE WATER FOR INJECTION IJ SOLN
INTRAMUSCULAR | Status: AC
  Filled 2013-10-10: qty 10

## 2013-10-10 MED ORDER — GLYCOPYRROLATE 0.2 MG/ML IJ SOLN
INTRAMUSCULAR | Status: AC
Start: 2013-10-10 — End: 2013-10-10
  Filled 2013-10-10: qty 3

## 2013-10-10 MED ORDER — HYDROMORPHONE HCL PF 1 MG/ML IJ SOLN
0.2500 mg | INTRAMUSCULAR | Status: DC | PRN
  Administered 2013-10-10: 0.5 mg via INTRAVENOUS

## 2013-10-10 MED ORDER — 0.9 % SODIUM CHLORIDE (POUR BTL) OPTIME
TOPICAL | Status: DC | PRN
  Administered 2013-10-10: 1000 mL

## 2013-10-10 MED ORDER — SODIUM CHLORIDE 0.9 % IJ SOLN: 3.0000 mL | INTRAMUSCULAR | Status: DC | PRN

## 2013-10-10 MED ORDER — SODIUM CHLORIDE 0.9 % IV SOLN: 250.0000 mL | INTRAVENOUS | Status: DC

## 2013-10-10 MED ORDER — ROCURONIUM BROMIDE 100 MG/10ML IV SOLN
INTRAVENOUS | Status: DC | PRN
Start: 2013-10-10 — End: 2013-10-10
  Administered 2013-10-10: 50 mg via INTRAVENOUS

## 2013-10-10 MED ORDER — LACTATED RINGERS IV SOLN
INTRAVENOUS | Status: DC | PRN
  Administered 2013-10-10 (×2): via INTRAVENOUS

## 2013-10-10 MED ORDER — BUPIVACAINE HCL (PF) 0.5 % IJ SOLN
INTRAMUSCULAR | Status: DC | PRN
  Administered 2013-10-10: 20 mL

## 2013-10-10 MED ORDER — HYDROMORPHONE HCL PF 1 MG/ML IJ SOLN
0.5000 mg | INTRAMUSCULAR | Status: DC | PRN
  Administered 2013-10-10 – 2013-10-11 (×2): 0.5 mg via INTRAVENOUS
  Filled 2013-10-10 (×2): qty 1

## 2013-10-10 MED ORDER — OXYCODONE HCL 5 MG/5ML PO SOLN: 5.0000 mg | Freq: Once | ORAL | Status: DC | PRN

## 2013-10-10 MED ORDER — LIDOCAINE HCL (CARDIAC) 20 MG/ML IV SOLN
INTRAVENOUS | Status: DC | PRN
  Administered 2013-10-10: 100 mg via INTRAVENOUS

## 2013-10-10 MED ORDER — NITROGLYCERIN 0.4 MG SL SUBL: 0.4000 mg | SUBLINGUAL_TABLET | SUBLINGUAL | Status: DC | PRN

## 2013-10-10 MED ORDER — HYDROCODONE-ACETAMINOPHEN 5-325 MG PO TABS
1.0000 | ORAL_TABLET | ORAL | Status: DC | PRN
  Administered 2013-10-11 – 2013-10-13 (×8): 2 via ORAL
  Filled 2013-10-10 (×8): qty 2

## 2013-10-10 MED ORDER — THROMBIN 20000 UNITS EX SOLR
CUTANEOUS | Status: DC | PRN
  Administered 2013-10-10: 10:00:00 via TOPICAL

## 2013-10-10 SURGICAL SUPPLY — 72 items
BENZOIN TINCTURE PRP APPL 2/3 (GAUZE/BANDAGES/DRESSINGS) IMPLANT
BLADE 10 SAFETY STRL DISP (BLADE) IMPLANT
BLADE SURG ROTATE 9660 (MISCELLANEOUS) IMPLANT
BUR MATCHSTICK NEURO 3.0 LAGG (BURR) ×3 IMPLANT
CANISTER SUCT 3000ML (MISCELLANEOUS) ×3 IMPLANT
CLOSURE WOUND 1/2 X4 (GAUZE/BANDAGES/DRESSINGS)
CONT SPEC 4OZ CLIKSEAL STRL BL (MISCELLANEOUS) ×3 IMPLANT
COVER BACK TABLE 24X17X13 BIG (DRAPES) IMPLANT
DECANTER SPIKE VIAL GLASS SM (MISCELLANEOUS) ×3 IMPLANT
DERMABOND ADHESIVE PROPEN (GAUZE/BANDAGES/DRESSINGS) ×2
DERMABOND ADVANCED (GAUZE/BANDAGES/DRESSINGS) ×2
DERMABOND ADVANCED .7 DNX12 (GAUZE/BANDAGES/DRESSINGS) ×1 IMPLANT
DERMABOND ADVANCED .7 DNX6 (GAUZE/BANDAGES/DRESSINGS) ×1 IMPLANT
DRAPE C-ARM 42X72 X-RAY (DRAPES) ×6 IMPLANT
DRAPE LAPAROTOMY 100X72X124 (DRAPES) ×3 IMPLANT
DRAPE POUCH INSTRU U-SHP 10X18 (DRAPES) ×3 IMPLANT
DRAPE SURG 17X23 STRL (DRAPES) ×3 IMPLANT
DRSG TELFA 3X8 NADH (GAUZE/BANDAGES/DRESSINGS) IMPLANT
DURAPREP 26ML APPLICATOR (WOUND CARE) ×3 IMPLANT
ELECT REM PT RETURN 9FT ADLT (ELECTROSURGICAL) ×3
ELECTRODE REM PT RTRN 9FT ADLT (ELECTROSURGICAL) ×1 IMPLANT
GAUZE SPONGE 4X4 16PLY XRAY LF (GAUZE/BANDAGES/DRESSINGS) IMPLANT
GLOVE BIO SURGEON STRL SZ8 (GLOVE) ×3 IMPLANT
GLOVE BIOGEL PI IND STRL 7.0 (GLOVE) ×1 IMPLANT
GLOVE BIOGEL PI IND STRL 7.5 (GLOVE) ×2 IMPLANT
GLOVE BIOGEL PI IND STRL 8 (GLOVE) ×3 IMPLANT
GLOVE BIOGEL PI INDICATOR 7.0 (GLOVE) ×2
GLOVE BIOGEL PI INDICATOR 7.5 (GLOVE) ×4
GLOVE BIOGEL PI INDICATOR 8 (GLOVE) ×6
GLOVE ECLIPSE 6.5 STRL STRAW (GLOVE) ×6 IMPLANT
GLOVE ECLIPSE 7.5 STRL STRAW (GLOVE) ×12 IMPLANT
GLOVE EXAM NITRILE LRG STRL (GLOVE) IMPLANT
GLOVE EXAM NITRILE MD LF STRL (GLOVE) IMPLANT
GLOVE EXAM NITRILE XL STR (GLOVE) IMPLANT
GLOVE EXAM NITRILE XS STR PU (GLOVE) IMPLANT
GLOVE SS BIOGEL STRL SZ 6.5 (GLOVE) ×2 IMPLANT
GLOVE SS N UNI LF 7.5 STRL (GLOVE) ×12 IMPLANT
GLOVE SUPERSENSE BIOGEL SZ 6.5 (GLOVE) ×4
GOWN STRL REUS W/ TWL LRG LVL3 (GOWN DISPOSABLE) ×3 IMPLANT
GOWN STRL REUS W/ TWL XL LVL3 (GOWN DISPOSABLE) ×3 IMPLANT
GOWN STRL REUS W/TWL 2XL LVL3 (GOWN DISPOSABLE) ×6 IMPLANT
GOWN STRL REUS W/TWL LRG LVL3 (GOWN DISPOSABLE) ×6
GOWN STRL REUS W/TWL XL LVL3 (GOWN DISPOSABLE) ×6
GRAFT BN 5X1XSPNE CVD POST DBM (Bone Implant) ×1 IMPLANT
GRAFT BONE MAGNIFUSE 1X5CM (Bone Implant) ×2 IMPLANT
KIT BASIN OR (CUSTOM PROCEDURE TRAY) ×3 IMPLANT
KIT POSITION SURG JACKSON T1 (MISCELLANEOUS) ×3 IMPLANT
KIT ROOM TURNOVER OR (KITS) ×3 IMPLANT
MILL MEDIUM DISP (BLADE) ×3 IMPLANT
NEEDLE HYPO 25X1 1.5 SAFETY (NEEDLE) ×3 IMPLANT
NEEDLE SPNL 18GX3.5 QUINCKE PK (NEEDLE) IMPLANT
NS IRRIG 1000ML POUR BTL (IV SOLUTION) ×3 IMPLANT
PACK LAMINECTOMY NEURO (CUSTOM PROCEDURE TRAY) ×3 IMPLANT
PAD ARMBOARD 7.5X6 YLW CONV (MISCELLANEOUS) ×6 IMPLANT
ROD DANEK 40MM (Rod) ×6 IMPLANT
SCREW PEDICLE VA L635 5.5X40M (Screw) ×3 IMPLANT
SCREW PEDICLE VA L635 6.5X40M (Screw) ×3 IMPLANT
SCREW SET BREAK OFF (Screw) ×12 IMPLANT
SPONGE GAUZE 4X4 12PLY (GAUZE/BANDAGES/DRESSINGS) IMPLANT
SPONGE LAP 4X18 X RAY DECT (DISPOSABLE) IMPLANT
SPONGE SURGIFOAM ABS GEL 100 (HEMOSTASIS) ×3 IMPLANT
STRIP CLOSURE SKIN 1/2X4 (GAUZE/BANDAGES/DRESSINGS) IMPLANT
SUT PROLENE 6 0 BV (SUTURE) IMPLANT
SUT VIC AB 0 CT1 18XCR BRD8 (SUTURE) ×4 IMPLANT
SUT VIC AB 0 CT1 8-18 (SUTURE) ×8
SUT VIC AB 2-0 CT1 18 (SUTURE) ×3 IMPLANT
SUT VIC AB 3-0 SH 8-18 (SUTURE) ×3 IMPLANT
SYR 20ML ECCENTRIC (SYRINGE) ×3 IMPLANT
TOWEL OR 17X24 6PK STRL BLUE (TOWEL DISPOSABLE) ×3 IMPLANT
TOWEL OR 17X26 10 PK STRL BLUE (TOWEL DISPOSABLE) ×3 IMPLANT
TRAY FOLEY CATH 14FRSI W/METER (CATHETERS) ×3 IMPLANT
WATER STERILE IRR 1000ML POUR (IV SOLUTION) ×3 IMPLANT

## 2013-10-10 NOTE — Progress Notes (Signed)
Utilization review completed.  

## 2013-10-10 NOTE — Anesthesia Procedure Notes (Signed)
Procedure Name: Intubation Date/Time: 10/10/2013 9:24 AM Performed by: Blair Heys E Pre-anesthesia Checklist: Patient identified, Emergency Drugs available, Suction available and Patient being monitored Patient Re-evaluated:Patient Re-evaluated prior to inductionOxygen Delivery Method: Circle system utilized Preoxygenation: Pre-oxygenation with 100% oxygen Intubation Type: IV induction Ventilation: Mask ventilation without difficulty and Oral airway inserted - appropriate to patient size Laryngoscope Size: Sabra Heck and 2 Grade View: Grade III Tube type: Oral Tube size: 7.5 mm Number of attempts: 1 Airway Equipment and Method: Stylet and Oral airway Placement Confirmation: ETT inserted through vocal cords under direct vision,  positive ETCO2 and breath sounds checked- equal and bilateral Secured at: 21 cm Tube secured with: Tape Dental Injury: Teeth and Oropharynx as per pre-operative assessment  Comments: Patient has very small mouth opening. Only base of arytenoids observed. Limited range of motion of neck.

## 2013-10-10 NOTE — Anesthesia Preprocedure Evaluation (Addendum)
Anesthesia Evaluation  Patient identified by MRN, date of birth, ID band Patient awake    Reviewed: Allergy & Precautions, H&P , NPO status , Patient's Chart, lab work & pertinent test results, reviewed documented beta blocker date and time   History of Anesthesia Complications (+) PONV and history of anesthetic complications  Airway Mallampati: III TM Distance: >3 FB   Mouth opening: Limited Mouth Opening  Dental  (+) Dental Advisory Given, Teeth Intact   Pulmonary          Cardiovascular hypertension, Pt. on medications and Pt. on home beta blockers + CAD and + Past MI     Neuro/Psych    GI/Hepatic PUD, GERD-  Medicated,  Endo/Other  Morbid obesity  Renal/GU      Musculoskeletal  (+) Arthritis -,   Abdominal   Peds  Hematology   Anesthesia Other Findings   Reproductive/Obstetrics                          Anesthesia Physical Anesthesia Plan  ASA: III  Anesthesia Plan: General   Post-op Pain Management:    Induction: Intravenous  Airway Management Planned: Oral ETT  Additional Equipment:   Intra-op Plan:   Post-operative Plan: Extubation in OR  Informed Consent: I have reviewed the patients History and Physical, chart, labs and discussed the procedure including the risks, benefits and alternatives for the proposed anesthesia with the patient or authorized representative who has indicated his/her understanding and acceptance.   Dental advisory given  Plan Discussed with: CRNA, Anesthesiologist and Surgeon  Anesthesia Plan Comments:         Anesthesia Quick Evaluation

## 2013-10-10 NOTE — Transfer of Care (Signed)
Immediate Anesthesia Transfer of Care Note  Patient: KRISTYNE WOODRING  Procedure(s) Performed: Procedure(s) with comments: POSTERIOR LUMBAR FUSION 1 LEVEL (N/A) - L5S1 laminectomy for synovial cyst with posterior lumbar fusion posterior lateral arthrodesis and posterior segmental instrumentation  Patient Location: PACU  Anesthesia Type:General  Level of Consciousness: awake, alert  and oriented  Airway & Oxygen Therapy: Patient Spontanous Breathing and Patient connected to nasal cannula oxygen  Post-op Assessment: Report given to PACU RN, Post -op Vital signs reviewed and stable and Patient moving all extremities X 4  Post vital signs: Reviewed and stable  Complications: No apparent anesthesia complications

## 2013-10-10 NOTE — Anesthesia Postprocedure Evaluation (Signed)
Anesthesia Post Note  Patient: Erin Cooke  Procedure(s) Performed: Procedure(s) (LRB): POSTERIOR LUMBAR FUSION 1 LEVEL (N/A)  Anesthesia type: general  Patient location: PACU  Post pain: Pain level controlled  Post assessment: Patient's Cardiovascular Status Stable  Last Vitals:  Filed Vitals:   10/10/13 1600  BP: 138/54  Pulse: 68  Temp: 36.7 C  Resp: 15    Post vital signs: Reviewed and stable  Level of consciousness: sedated  Complications: No apparent anesthesia complications

## 2013-10-10 NOTE — H&P (Signed)
  BP 158/59  Pulse 75  Temp(Src) 97.9 F (36.6 C) (Oral)  Resp 20  Wt 90.719 kg (200 lb)  SpO2 97%   HOPI:                                                   Erin Cooke is a long term patient of mine.  For the last year, she has been undergoing spine injection for pain in the lower back.  She unfortunately took a fall the day after Christmas, I believe, and had some bruising on her right buttock.  She's had pain in and out since that time without relief.  She has got an injection.  She has had physical therapy.  She is taking medications for this all without relief.  She has had some tumor markers elevated, and with history of breast cancer, she is concerned, but various radiographic studies have not revealed anything.  So, she has just been busy and finally finding a way to me.     PHYSICAL EXAMINATION:                    Vital signs today, height is 61 inches, weight 206 pounds, BMI 38.92, blood pressure 145/80, pulse 70, respiratory rate is 16.  On exam, Erin Cooke has 5/5 strength in her lower and upper extremities.  2+ reflexes in biceps, triceps, brachioradialis, knees, and ankles.  Intact proprioception.  Pupils are equal, round, and reactive to light.  Hearing intact to voice.  Symmetric facial movements and sensation.     MEDICATIONS AND ALLERGIES:          Aleve, aspirin, Betapace, Cozaar, Lipitor, Prilosec, and Voltaren gel.  No known drug allergies.              Erin Cooke comes in today for followup of the MRI.  What the MRI shows is that she has a humongous synovial cyst on the right side compressing the thecal sac at L5-S1.  This is only going to get better if she indeed undergoes operative decompression.  Since she has a fusion above, I think she does need to have this level fused also.  She has a wedding and a 73th birthday party all coming up and she is not going to be able to do this until after 10/02/2013.

## 2013-10-10 NOTE — Op Note (Signed)
10/10/2013  1:46 PM  PATIENT:  Erin Cooke  73 y.o. 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  SURGEON:  Surgeon(s): Winfield Cunas, MD Eustace Moore, MD  ASSISTANTS:Jones, Shanon Brow  ANESTHESIA:   general  EBL:  Total I/O In: 2000 [I.V.:2000] Out: 800 [Urine:500; Blood:300]  BLOOD ADMINISTERED:none  CELL SAVER GIVEN:none  COUNT:Per nursing  DRAINS: none   SPECIMEN:  No Specimen  DICTATION: Erin Cooke is a 73 y.o. female whom was taken to the operating room intubated, and placed under a general anesthetic without difficulty. A foley catheter was placed under sterile conditions. She was positioned prone on a Jackson stable with all pressure points properly padded.  Her lumbar region was prepped and draped in a sterile manner. I infiltrated 10cc's 1/2%lidocaine/1:2000,000 strength epinephrine into the planned incision, using the inferior half of the original incision, and extending it caudally. I opened the skin with a 10 blade and took the incision down to the thoracolumbar fascia.I removed the L4 pedicle screws and the rod by exposing the hardware at L4, and L5 bilaterally. I then removed the locking caps, then the rods, and finally the L4 screws without difficulty.   I exposed the lamina of L5 in a subperiosteal fashion bilaterally. I confirmed my location with an intraoperative xray.  I placed self retaining retractors and started the decompression. I did bilateral semihemilaminectomies to free the L5 roots. I removed the synovial cyst on the right side. It  was not adherent to the thecal sac. I was able to fully decompress the L5, and S1 roots bilaterally, and the spinal canal. I used the drill, and Kerrison punches to decompress the spinal canal at L5/S1 I decorticated the lateral bone at L5, and S1. I then placed morselized allograft on the decorticated surfaces to complete the posterolateral arthrodesis.  We  placed pedicle screws at S1, using fluoroscopic guidance. I drilled a pilot hole, then cannulated the pedicle with a probe at each site. I then tapped each pedicle, assessing each site for pedicle violations. No cutouts were appreciated. Screws (medtronic legacy) were then placed at each site without difficulty. Final films were performed and all screws appeared to be in good position.  We closed the wound in a layered fashion. We approximated the thoracolumbar fascia, subcutaneous, and subcuticular planes with vicryl sutures. I used Dermabond for a sterile dressing.     PLAN OF CARE: Admit to inpatient   PATIENT DISPOSITION:  PACU - hemodynamically stable.   Delay start of Pharmacological VTE agent (>24hrs) due to surgical blood loss or risk of bleeding:  yes

## 2013-10-11 NOTE — Progress Notes (Signed)
Lumbar ciOrthopedic Tech Progress Note Patient Details:  [name] 1940/09/22 [id number] Lumbar corsett delivered Patient ID: Erin Cooke, female   DOB: 1941-03-02, 73 y.o.   MRN: [id number]   Fenton Foy 10/11/2013, 9:22 AM

## 2013-10-11 NOTE — Progress Notes (Signed)
Physical Therapy Evaluation Patient Details Name: Erin Cooke MRN: [id number] DOB: 1940/08/27 Today's Date: 10/11/2013   History of Present Illness  73 y.o. admitted 10/10/13 with LBP s/p L5-S1 fusion. PMHx of MI, CAD, hyperlipidemia, bilateral rotator cuff surgery, R ankle replacement, and back surgery x2.   Clinical Impression  Patient mobilizing well with use of walker. Patient has good family support at home and well enough to go home with support of son and daughter supervision at discharge.  PT to follow acutely for deficits listed below.       Follow Up Recommendations No PT follow up          Precautions / Restrictions Precautions Precautions: Back Precaution Booklet Issued: Yes (comment) Precaution Comments: Educated on back precautions. Required Braces or Orthoses: Spinal Brace Spinal Brace: Lumbar corset;Applied in sitting position Restrictions Weight Bearing Restrictions: No      Mobility  Bed Mobility Overal bed mobility: Needs Assistance Bed Mobility: Rolling;Sidelying to Sit Rolling: Min guard Sidelying to sit: Min assist       General bed mobility comments: Patient needed assistance to sitting due to hump in bed  Transfers Overall transfer level: Modified independent Equipment used: Rolling walker (2 wheeled)             General transfer comment: Patient pushed up from the bed with one arm while placing other hand on walker for support  Ambulation/Gait Ambulation/Gait assistance: Min guard Ambulation Distance (Feet): 150 Feet Assistive device: Rolling walker (2 wheeled) Gait Pattern/deviations: Step-through pattern;Step-to pattern (Used step-to for first 10 ft progressed to step-through)             Balance Overall balance assessment: Modified Independent (Needed walker for support)                                           Pertinent Vitals/Pain See vitals flow sheet.     Home Living Family/patient expects  to be discharged to:: Private residence Living Arrangements: Alone (By self now but son and daughter and non-relative caregiver.) Available Help at Discharge: Available 24 hours/day (Son/daughter for wk then non-relative will stay. Sister also) Type of Home: House Home Access: Stairs to enter Entrance Stairs-Rails: Can reach both Entrance Stairs-Number of Steps: 10 at front 1 at back (Uses front entrance when raining)   Home Equipment: Walker - 2 wheels;Walker - standard;Grab bars - tub/shower;Shower seat      Prior Function Level of Independence: Independent               Hand Dominance   Dominant Hand: Right       Communication   Communication: No difficulties  Cognition Arousal/Alertness: Awake/alert Behavior During Therapy: WFL for tasks assessed/performed Overall Cognitive Status: Within Functional Limits for tasks assessed                       PT Goals (Current goals can be found in the Care Plan section) Acute Rehab PT Goals PT Goal Formulation: With patient Time For Goal Achievement: 10/25/13 Potential to Achieve Goals: Good     End of Session Equipment Utilized During Treatment: Gait belt;Back brace Activity Tolerance: Patient tolerated treatment well Patient left: in chair;with call bell/phone within reach;with chair alarm set           Time: 0630-1601 PT Time Calculation (min): 28 min   Charges:   PT  Evaluation $Initial PT Evaluation Tier I: 1 Procedure PT Treatments $Gait Training: 8-22 mins   PT G CodesEber Jones, [place] [phone]

## 2013-10-11 NOTE — Progress Notes (Signed)
Read, reviewed, edited and agree with student's findings and recommendations.  Rebecca B. Medendorp, PT, DPT #[phone]  

## 2013-10-11 NOTE — Progress Notes (Signed)
OT Cancellation Note  Patient Details Name: BAYLIN CABAL MRN: [id number] DOB: Nov 18, 1940   Cancelled Treatment:    Reason Eval/Treat Not Completed: Other (comment). OT made visit this date for evaluation. Pt reports that she had "spinal surgery" three years ago and uses her reacher quite frequently since that surgery. Pt inquired as to where to purchase new hip kit and OT explained price and purchase of AE kit from gift shop. OT offered pt opportunity for functional mobility and pt declined and states she has no ADL concerns and will have 24/7 assistance at home. Per PT, pt ambulating at min guard level with Mod independence with transfers. Acute OT to sign off at this time.   Juluis Rainier [phone] 10/11/2013, 2:17 PM

## 2013-10-11 NOTE — Progress Notes (Signed)
Patient ID: Erin Cooke, female   DOB: Oct 03, 1940, 73 y.o.   MRN: [id number] BP 108/55  Pulse 86  Temp(Src) 98.5 F (36.9 C) (Oral)  Resp 18  Ht 5\' 1"  (1.549 m)  Wt 95.89 kg (211 lb 6.4 oz)  BMI 39.96 kg/m2  SpO2 95% Alert and oriented x 4, speech is clear and fluent Moving all extremities well Wound clean, no signs of infection Doing well

## 2013-10-12 NOTE — Progress Notes (Signed)
Read, reviewed, edited and agree with student's findings and recommendations.  Rebecca B. Medendorp, PT, DPT #[phone]  

## 2013-10-12 NOTE — Progress Notes (Signed)
Patient ID: Erin Cooke, female   DOB: [birth year], 73 y.o.   MRN: [id number] BP 128/53  Pulse 81  Temp(Src) 98.4 F (36.9 C) (Oral)  Resp 18  Ht 5\' 1"  (1.549 m)  Wt 95.89 kg (211 lb 6.4 oz)  BMI 39.96 kg/m2  SpO2 97% Moving well Wound is clean dry, and without signs of infection Alert, oriented, speech is clear and fluent. Probable discharge tomorrow.

## 2013-10-12 NOTE — Progress Notes (Signed)
Physical Therapy Treatment Patient Details Name: Erin Cooke MRN: [id number] DOB: 1940-06-10 Today's Date: 10/12/2013    History of Present Illness 73 y.o. admitted 10/10/13 with LBP s/p L5-S1 fusion. PMHx of MI, CAD, hyperlipidemia, bilateral rotator cuff surgery, R ankle replacement, and back surgery x2.     PT Comments    Pt mobilizing well with use of walker for safety and is able to amb short distances without it. She has good family support at home and is well enough to go home with son and daughter-in-law supervision at discharge.   Follow Up Recommendations  No PT follow up           Precautions / Restrictions Precautions Precautions: Back Precaution Comments: Educated on back precautions. Required Braces or Orthoses: Spinal Brace Spinal Brace: Lumbar corset;Applied in sitting position Restrictions Weight Bearing Restrictions: No    Mobility  Bed Mobility Overal bed mobility: Needs Assistance Bed Mobility: Rolling;Sidelying to Sit Rolling: Min guard Sidelying to sit: Min guard       General bed mobility comments: Patient was able to sit up and get to EOB with min guard for safety purposes  Transfers Overall transfer level: Modified independent Equipment used: Rolling walker (2 wheeled)             General transfer comment: Patient cued to push up from the bed with one arm while placing other hand on walker for support  Ambulation/Gait Ambulation/Gait assistance: Min guard Ambulation Distance (Feet): 175 Feet Assistive device: Rolling walker (2 wheeled) Gait Pattern/deviations: Step-through pattern     General Gait Details: Once back in the room patient was able to amb without the use of walker   Stairs Stairs: Yes Stairs assistance: Min guard Stair Management: Two rails;Step to pattern Number of Stairs: 10 General stair comments: Patient went up with left foot and down with right due to past R ankle replacement      Balance Overall  balance assessment: Modified Independent (Due to use of walker and railings for safety purposes)                                  Cognition Arousal/Alertness: Awake/alert Behavior During Therapy: WFL for tasks assessed/performed Overall Cognitive Status: Within Functional Limits for tasks assessed                             Pertinent Vitals/Pain See vitals flow sheet.            PT Goals (current goals can now be found in the care plan section) Acute Rehab PT Goals PT Goal Formulation: With patient Time For Goal Achievement: 10/26/13 Potential to Achieve Goals: Good Progress towards PT goals: Progressing toward goals    Frequency  Min 5X/week     End of Session Equipment Utilized During Treatment: Gait belt;Back brace Activity Tolerance: Patient tolerated treatment well Patient left: in chair;with call bell/phone within reach;with chair alarm set     Time: 0940-1001 PT Time Calculation (min): 21 min  Charges:  $Gait Training: 8-22 mins                    G CodesEber Jones, [place] [phone]

## 2013-10-12 NOTE — Care Management Note (Signed)
    Page 1 of 1   10/12/2013     2:59:21 PM CARE MANAGEMENT NOTE 10/12/2013  Patient:  Erin Cooke, Erin Cooke   Account Number:  [id number]  Date Initiated:  10/12/2013  Documentation initiated by:  Lorne Skeens  Subjective/Objective Assessment:   Patient admitted with lumbar sunovial cyst, stenosis, radiculopathy, low back pain.  Surgery on 10/10/13.  Lives at home alone.     Action/Plan:   Will follow for discharge needs pending PT/OT evals and physician orders.   Anticipated DC Date:  10/13/2013   Anticipated DC Plan:  HOME/SELF CARE         Choice offered to / List presented to:             Status of service:   Medicare Important Message given?  YES (If response is "NO", the following Medicare IM given date fields will be blank) Date Medicare IM given:  10/12/2013 Date Additional Medicare IM given:    Discharge Disposition:    Per UR Regulation:  Reviewed for med. necessity/level of care/duration of stay  If discussed at [place] of Stay Meetings, dates discussed:    Comments:  10/12/13 [place], MSN, CM- Met with patient to provide Medicare IM letter in anticipation of discharge home tomorrow.

## 2013-10-13 MED ORDER — CYCLOBENZAPRINE HCL 10 MG PO TABS: 10.0000 mg | ORAL_TABLET | Freq: Three times a day (TID) | ORAL | Status: DC | PRN

## 2013-10-13 MED ORDER — OXYCODONE-ACETAMINOPHEN 5-325 MG PO TABS: 1.0000 | ORAL_TABLET | Freq: Four times a day (QID) | ORAL | Status: DC | PRN

## 2013-10-13 NOTE — Progress Notes (Signed)
Pt d/c to home by car with family. Assessment stable. Prescriptions given. 

## 2013-10-13 NOTE — Progress Notes (Signed)
MD removed honeycomb dsg at approximately 1930 and pt ambulated to bathroom. Pt requested for RN, upon assessment pt had blood dripping down her back. Small amount of sanguineous blood. Cleansed with sterile saline and placed guaze dsg on incision. Paged on call, Dr Arnoldo Morale said to keep reinforcing/changing as needed. 0400 2 4x4 gauze saturated in sanguineous blood. Cleansed with sterile saline and placed pressure dsg. Pt not in pain, no symptoms, will continue to monitor and pass along.

## 2013-10-13 NOTE — Discharge Summary (Signed)
  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  Status:Alive and well Discharge dest: Home Physical Exam  Constitutional: She is oriented to person, place, and time. She appears well-developed and well-nourished.  HENT:  Head: Normocephalic and atraumatic.  Musculoskeletal: Normal range of motion.  Neurological: She is alert and oriented to person, place, and time. She displays normal reflexes. No cranial nerve deficit. She exhibits normal muscle tone. Coordination normal.  She is voiding, tolerating a regular diet, and ambulating at discharge. Wound is clean, dry, and without signs of infection.  Medications: percocet, flexeril

## 2013-10-13 NOTE — Discharge Instructions (Signed)

## 2015-04-20 HISTORY — PX: COLON SURGERY: SHX602

## 2015-04-30 ENCOUNTER — Emergency Department (HOSPITAL_COMMUNITY): Payer: Medicare Other

## 2015-04-30 ENCOUNTER — Inpatient Hospital Stay (HOSPITAL_COMMUNITY)
Admission: EM | Admit: 2015-04-30 | Discharge: 2015-05-02 | DRG: 378 | Disposition: A | Discharge status: Home / Self Care | Payer: Medicare Other | Attending: Internal Medicine | Admitting: Internal Medicine

## 2015-04-30 ENCOUNTER — Encounter (HOSPITAL_COMMUNITY): Payer: Self-pay

## 2015-04-30 ENCOUNTER — Encounter: Payer: Self-pay | Admitting: Gastroenterology

## 2015-04-30 DIAGNOSIS — I252 Old myocardial infarction: Secondary | ICD-10-CM | POA: Diagnosis not present

## 2015-04-30 DIAGNOSIS — D125 Benign neoplasm of sigmoid colon: Secondary | ICD-10-CM | POA: Diagnosis present

## 2015-04-30 DIAGNOSIS — Z955 Presence of coronary angioplasty implant and graft: Secondary | ICD-10-CM

## 2015-04-30 DIAGNOSIS — E785 Hyperlipidemia, unspecified: Secondary | ICD-10-CM | POA: Diagnosis present

## 2015-04-30 DIAGNOSIS — D62 Acute posthemorrhagic anemia: Secondary | ICD-10-CM | POA: Diagnosis not present

## 2015-04-30 DIAGNOSIS — I2510 Atherosclerotic heart disease of native coronary artery without angina pectoris: Secondary | ICD-10-CM | POA: Diagnosis not present

## 2015-04-30 DIAGNOSIS — Z9011 Acquired absence of right breast and nipple: Secondary | ICD-10-CM

## 2015-04-30 DIAGNOSIS — Z8711 Personal history of peptic ulcer disease: Secondary | ICD-10-CM | POA: Diagnosis not present

## 2015-04-30 DIAGNOSIS — K921 Melena: Principal | ICD-10-CM | POA: Diagnosis present

## 2015-04-30 DIAGNOSIS — D123 Benign neoplasm of transverse colon: Secondary | ICD-10-CM | POA: Diagnosis not present

## 2015-04-30 DIAGNOSIS — Z800 Family history of malignant neoplasm of digestive organs: Secondary | ICD-10-CM

## 2015-04-30 DIAGNOSIS — M1990 Unspecified osteoarthritis, unspecified site: Secondary | ICD-10-CM | POA: Diagnosis present

## 2015-04-30 DIAGNOSIS — Z923 Personal history of irradiation: Secondary | ICD-10-CM

## 2015-04-30 DIAGNOSIS — K5730 Diverticulosis of large intestine without perforation or abscess without bleeding: Secondary | ICD-10-CM | POA: Diagnosis not present

## 2015-04-30 DIAGNOSIS — Z803 Family history of malignant neoplasm of breast: Secondary | ICD-10-CM | POA: Diagnosis not present

## 2015-04-30 DIAGNOSIS — K760 Fatty (change of) liver, not elsewhere classified: Secondary | ICD-10-CM | POA: Diagnosis present

## 2015-04-30 DIAGNOSIS — Z981 Arthrodesis status: Secondary | ICD-10-CM | POA: Diagnosis not present

## 2015-04-30 DIAGNOSIS — Z8049 Family history of malignant neoplasm of other genital organs: Secondary | ICD-10-CM

## 2015-04-30 DIAGNOSIS — Z86010 Personal history of colonic polyps: Secondary | ICD-10-CM | POA: Diagnosis not present

## 2015-04-30 DIAGNOSIS — D124 Benign neoplasm of descending colon: Secondary | ICD-10-CM | POA: Diagnosis present

## 2015-04-30 DIAGNOSIS — K625 Hemorrhage of anus and rectum: Secondary | ICD-10-CM | POA: Diagnosis present

## 2015-04-30 DIAGNOSIS — I10 Essential (primary) hypertension: Secondary | ICD-10-CM | POA: Diagnosis present

## 2015-04-30 DIAGNOSIS — K644 Residual hemorrhoidal skin tags: Secondary | ICD-10-CM | POA: Diagnosis present

## 2015-04-30 DIAGNOSIS — K219 Gastro-esophageal reflux disease without esophagitis: Secondary | ICD-10-CM | POA: Diagnosis present

## 2015-04-30 DIAGNOSIS — K648 Other hemorrhoids: Secondary | ICD-10-CM | POA: Diagnosis not present

## 2015-04-30 DIAGNOSIS — Z853 Personal history of malignant neoplasm of breast: Secondary | ICD-10-CM | POA: Diagnosis not present

## 2015-04-30 DIAGNOSIS — C182 Malignant neoplasm of ascending colon: Secondary | ICD-10-CM | POA: Diagnosis not present

## 2015-04-30 LAB — CBC WITH DIFFERENTIAL/PLATELET
BASOS ABS: 0 10*3/uL (ref 0.0–0.1)
Basophils Relative: 0 %
EOS PCT: 1 %
Eosinophils Absolute: 0.1 10*3/uL (ref 0.0–0.7)
HCT: 34.3 % — ABNORMAL LOW (ref 36.0–46.0)
Hemoglobin: 10.9 g/dL — ABNORMAL LOW (ref 12.0–15.0)
LYMPHS ABS: 2.3 10*3/uL (ref 0.7–4.0)
Lymphocytes Relative: 27 %
MCH: 26.4 pg (ref 26.0–34.0)
MCHC: 31.8 g/dL (ref 30.0–36.0)
MCV: 83.1 fL (ref 78.0–100.0)
MONOS PCT: 5 %
Monocytes Absolute: 0.4 10*3/uL (ref 0.1–1.0)
NEUTROS PCT: 67 %
Neutro Abs: 5.7 10*3/uL (ref 1.7–7.7)
PLATELETS: 253 10*3/uL (ref 150–400)
RBC: 4.13 MIL/uL (ref 3.87–5.11)
RDW: 15.6 % — AB (ref 11.5–15.5)
WBC: 8.5 10*3/uL (ref 4.0–10.5)

## 2015-04-30 LAB — COMPREHENSIVE METABOLIC PANEL
ALT: 15 U/L (ref 14–54)
ANION GAP: 7 (ref 5–15)
AST: 19 U/L (ref 15–41)
Albumin: 3.6 g/dL (ref 3.5–5.0)
Alkaline Phosphatase: 91 U/L (ref 38–126)
BILIRUBIN TOTAL: 0.4 mg/dL (ref 0.3–1.2)
BUN: 22 mg/dL — AB (ref 6–20)
CHLORIDE: 107 mmol/L (ref 101–111)
CO2: 27 mmol/L (ref 22–32)
Calcium: 8.9 mg/dL (ref 8.9–10.3)
Creatinine, Ser: 1 mg/dL (ref 0.44–1.00)
GFR, EST NON AFRICAN AMERICAN: 53 mL/min — AB (ref >60)
Glucose, Bld: 109 mg/dL — ABNORMAL HIGH (ref 65–99)
POTASSIUM: 4.7 mmol/L (ref 3.5–5.1)
Sodium: 141 mmol/L (ref 135–145)
TOTAL PROTEIN: 7.1 g/dL (ref 6.5–8.1)

## 2015-04-30 LAB — ABO/RH: ABO/RH(D): O POS

## 2015-04-30 LAB — CBC
HEMATOCRIT: 33.7 % — AB (ref 36.0–46.0)
Hemoglobin: 10.5 g/dL — ABNORMAL LOW (ref 12.0–15.0)
MCH: 25.8 pg — ABNORMAL LOW (ref 26.0–34.0)
MCHC: 31.2 g/dL (ref 30.0–36.0)
MCV: 82.8 fL (ref 78.0–100.0)
Platelets: 243 10*3/uL (ref 150–400)
RBC: 4.07 MIL/uL (ref 3.87–5.11)
RDW: 15.7 % — AB (ref 11.5–15.5)
WBC: 9 10*3/uL (ref 4.0–10.5)

## 2015-04-30 LAB — MRSA PCR SCREENING: MRSA by PCR: NEGATIVE

## 2015-04-30 LAB — PREPARE RBC (CROSSMATCH)

## 2015-04-30 MED ORDER — SODIUM CHLORIDE 0.9 % IV SOLN
INTRAVENOUS | Status: DC
  Administered 2015-04-30: 18:00:00 via INTRAVENOUS
  Administered 2015-05-01: 1 mL via INTRAVENOUS
  Administered 2015-05-01: 08:00:00 via INTRAVENOUS

## 2015-04-30 MED ORDER — ACETAMINOPHEN 325 MG PO TABS
650.0000 mg | ORAL_TABLET | Freq: Four times a day (QID) | ORAL | Status: DC | PRN
  Filled 2015-04-30: qty 2

## 2015-04-30 MED ORDER — ONDANSETRON HCL 4 MG PO TABS
4.0000 mg | ORAL_TABLET | Freq: Four times a day (QID) | ORAL | Status: DC | PRN
  Filled 2015-04-30: qty 1

## 2015-04-30 MED ORDER — IOHEXOL 300 MG/ML  SOLN
100.0000 mL | Freq: Once | INTRAMUSCULAR | Status: AC | PRN
  Administered 2015-04-30: 100 mL via INTRAVENOUS

## 2015-04-30 MED ORDER — SODIUM CHLORIDE 0.9 % IV BOLUS (SEPSIS)
500.0000 mL | Freq: Once | INTRAVENOUS | Status: AC
  Administered 2015-04-30: 500 mL via INTRAVENOUS

## 2015-04-30 MED ORDER — SODIUM CHLORIDE 0.9 % IV SOLN: Freq: Once | INTRAVENOUS | Status: DC

## 2015-04-30 MED ORDER — MORPHINE SULFATE (PF) 2 MG/ML IV SOLN
1.0000 mg | INTRAVENOUS | Status: DC | PRN
  Filled 2015-04-30: qty 0.5

## 2015-04-30 MED ORDER — PREDNISOLONE ACETATE 1 % OP SUSP
1.0000 [drp] | Freq: Every day | OPHTHALMIC | Status: DC
  Administered 2015-04-30 – 2015-05-01 (×2): 1 [drp] via OPHTHALMIC
  Filled 2015-04-30: qty 1

## 2015-04-30 MED ORDER — PEG 3350-KCL-NABCB-NACL-NASULF 236 G PO SOLR
4000.0000 mL | Freq: Once | ORAL | Status: AC
  Administered 2015-05-01: 4000 mL via ORAL
  Filled 2015-04-30: qty 4000

## 2015-04-30 MED ORDER — POLYVINYL ALCOHOL 1.4 % OP SOLN
1.0000 [drp] | Freq: Every day | OPHTHALMIC | Status: DC
  Administered 2015-05-02: 1 [drp] via OPHTHALMIC
  Filled 2015-04-30: qty 15

## 2015-04-30 MED ORDER — ONDANSETRON HCL 4 MG/2ML IJ SOLN
4.0000 mg | Freq: Once | INTRAMUSCULAR | Status: AC
  Administered 2015-04-30: 4 mg via INTRAVENOUS

## 2015-04-30 MED ORDER — ACETAMINOPHEN 650 MG RE SUPP
650.0000 mg | Freq: Four times a day (QID) | RECTAL | Status: DC | PRN
  Filled 2015-04-30: qty 1

## 2015-04-30 MED ORDER — ONDANSETRON HCL 4 MG/2ML IJ SOLN
4.0000 mg | Freq: Four times a day (QID) | INTRAMUSCULAR | Status: DC | PRN
  Administered 2015-05-01: 4 mg via INTRAVENOUS
  Filled 2015-04-30: qty 2

## 2015-04-30 MED ORDER — DIATRIZOATE MEGLUMINE & SODIUM 66-10 % PO SOLN
ORAL | Status: AC
  Filled 2015-04-30: qty 30

## 2015-04-30 MED ORDER — PANTOPRAZOLE SODIUM 40 MG IV SOLR
40.0000 mg | Freq: Once | INTRAVENOUS | Status: AC
  Administered 2015-04-30: 40 mg via INTRAVENOUS
  Filled 2015-04-30: qty 40

## 2015-04-30 MED ORDER — ONDANSETRON HCL 4 MG/2ML IJ SOLN
INTRAMUSCULAR | Status: AC
  Filled 2015-04-30: qty 2

## 2015-04-30 NOTE — ED Notes (Signed)
Pt reports yesterday she woke up feeling like had to have a bm and when she did, it was a large amount of bright red blood.  Reports saw a NP at gastroenterologists office yesterday and was prescribed cipro and flagyl and pt started the antibiotics yesterday.  PT says this morning she still was passing bright red blood and felt lightheaded and nauseated.

## 2015-04-30 NOTE — H&P (Signed)
[facility]          History and Physical    PCP:   PROVIDER NOT IN SYSTEM   EDP: Milton Ferguson M.D.  Chief Complaint:  Rectal bleeding  HPI: Patient is a pleasant 75 year old woman with history of hypertension and remote coronary artery disease who presents to the hospital today with rectal bleeding. She has had 3 episodes beginning yesterday of a large amount of bright red blood per rectum without abdominal pain. She comes into the hospital today for evaluation where she was found to have a hemoglobin of 10.9 down from her normal usual of 12. She has already been seen by GI, Dr. Laural Golden, with plans for colonoscopy in a.m. We have been asked to admit her for further evaluation and management.  Allergies:   Allergies  Allergen Reactions  . Forane [Isoflurane] Other (See Comments)    Elevated liver enzymes      Past Medical History  Diagnosis Date  . PONV (postoperative nausea and vomiting)     states Forane elevated her liver enzymes  . Hyperlipidemia     takes Atorvastatin daily  . Coronary artery disease   . Myocardial infarction ([place]) 1989  . Palpitations     takes betapace daily  . Arthritis   . Cancer (HCC)     breast  . Chronic back pain     synovial cyst,radiculopathy,and stenosis  . GERD (gastroesophageal reflux disease)     takes OTC Prilosec daily  . Erosive esophagitis   . History of colon polyps   . Microscopic colitis   . Stress incontinence   . Thyroid nodule     benign    Past Surgical History  Procedure Laterality Date  . Rotator cuff repair Left 1989  . Ankle surgery Right 1990    pins and plate  . Cardiac catheterization  1989/1999  . Coronary angioplasty      2 stents  . Rotator cuff repair Right 2001  . Carpal tunnel release Right 2004  . Back surgery  2011  . Spinal fusion  2012  . Total ankle replacement Right 2012  . Mastectomy, partial Right 2013  . Dilation and curettage of uterus  2014  . Cataract  surgery with corneal transplant  Right 2008  . Cataract surgery with corneal transplant  Left 2014  . Eye lid surgery Bilateral 2014  . Esophagogastroduodenoscopy    . Colonoscopy    . Biopsy thyroid      Prior to Admission medications   Medication Sig Start Date End Date Taking? Authorizing Provider  aspirin EC 81 MG tablet Take 81 mg by mouth daily.   Yes Historical Provider, MD  atorvastatin (LIPITOR) 80 MG tablet Take 80 mg by mouth at bedtime.   Yes Historical Provider, MD  BIOTIN 5000 PO Take 2 tablets by mouth daily.   Yes Historical Provider, MD  Calcium Carbonate-Vitamin D (CALCIUM + D PO) Take 1 tablet by mouth 2 (two) times daily.   Yes Historical Provider, MD  ciprofloxacin (CIPRO) 250 MG tablet Take 250 mg by mouth 2 (two) times daily.   Yes Historical Provider, MD  Loperamide HCl (IMODIUM PO) Take 1 tablet by mouth daily as needed (for diarrhea).   Yes Historical Provider, MD  losartan (COZAAR) 50 MG tablet Take 50 mg by mouth daily.   Yes Historical Provider, MD  Melatonin 5 MG CAPS Take 1 capsule by mouth  at bedtime.   Yes Historical Provider, MD  metroNIDAZOLE (FLAGYL) 250 MG tablet Take 250 mg by mouth 2 (two) times daily.   Yes Historical Provider, MD  Multiple Vitamin (MULTIVITAMIN WITH MINERALS) TABS tablet Take 1 tablet by mouth daily.   Yes Historical Provider, MD  naproxen (NAPROSYN) 500 MG tablet Take 500 mg by mouth daily.   Yes Historical Provider, MD  nitroGLYCERIN (NITROSTAT) 0.4 MG SL tablet Place 0.4 mg under the tongue every 5 (five) minutes as needed for chest pain.   Yes Historical Provider, MD  omeprazole (PRILOSEC OTC) 20 MG tablet Take 20 mg by mouth every morning.   Yes Historical Provider, MD  prednisoLONE acetate (PRED FORTE) 1 % ophthalmic suspension Place 1 drop into both eyes daily.   Yes Historical Provider, MD  Probiotic Product ([place]) Take 1 capsule by mouth daily.   Yes Historical Provider, MD  Propylene Glycol (SYSTANE  BALANCE OP) Apply 1 drop to eye daily.   Yes Historical Provider, MD  sotalol (BETAPACE) 80 MG tablet Take 80 mg by mouth 2 (two) times daily.   Yes Historical Provider, MD  oxyCODONE-acetaminophen (ROXICET) 5-325 MG per tablet Take 1 tablet by mouth every 6 (six) hours as needed for severe pain. Patient not taking: Reported on 04/30/2015 10/13/13   Ashok Pall, MD    Social History:  reports that she has never smoked. She does not have any smokeless tobacco history on file. She reports that she does not drink alcohol or use illicit drugs.  Family history: No history of hypertension, diabetes, GI bleeding and family members.  Review of Systems:  Constitutional: Denies fever, chills, diaphoresis, appetite change and fatigue.  HEENT: Denies photophobia, eye pain, redness, hearing loss, ear pain, congestion, sore throat, rhinorrhea, sneezing, mouth sores, trouble swallowing, neck pain, neck stiffness and tinnitus.   Respiratory: Denies SOB, DOE, cough, chest tightness,  and wheezing.   Cardiovascular: Denies chest pain, palpitations and leg swelling.  Gastrointestinal: Denies nausea, vomiting, abdominal pain, diarrhea, constipation, and abdominal distention.  Genitourinary: Denies dysuria, urgency, frequency, hematuria, flank pain and difficulty urinating.  Endocrine: Denies: hot or cold intolerance, sweats, changes in hair or nails, polyuria, polydipsia. Musculoskeletal: Denies myalgias, back pain, joint swelling, arthralgias and gait problem.  Skin: Denies pallor, rash and wound.  Neurological: Denies dizziness, seizures, syncope, weakness, light-headedness, numbness and headaches.  Hematological: Denies adenopathy. Easy bruising, personal or family bleeding history  Psychiatric/Behavioral: Denies suicidal ideation, mood changes, confusion, nervousness, sleep disturbance and agitation   Physical Exam: Blood pressure 125/58, pulse 63, temperature 97.6 F (36.4 C), temperature source Oral,  resp. rate 16, height _0  (1.549 m), weight 89.6 kg (197 lb 8.5 oz), SpO2 99 %. General: Alert, awake, oriented 3, no current distress HEENT: Normocephalic, atraumatic, pupils equal round and reactive to light, extraocular movements intact Neck: Supple, no JVD, no lymphadenopathy, no bruits, no goiter Cardiovascular: Regular rate and rhythm, no murmurs, rubs or gallops Lungs: Clear to auscultation bilaterally Abdomen: Soft, nontender, nondistended, positive bowel sounds Extremities: Trace bilateral pitting edema Neurologic: Grossly intact and nonfocal  Labs on Admission:  Results for orders placed or performed during the hospital encounter of 04/30/15 (from the past 48 hour(s))  CBC with Differential/Platelet     Status: Abnormal   Collection Time: 04/30/15 11:24 AM  Result Value Ref Range   WBC 8.5 4.0 - 10.5 K/uL   RBC 4.13 3.87 - 5.11 MIL/uL   Hemoglobin 10.9 (L) 12.0 - 15.0 g/dL  HCT 34.3 (L) 36.0 - 46.0 %   MCV 83.1 78.0 - 100.0 fL   MCH 26.4 26.0 - 34.0 pg   MCHC 31.8 30.0 - 36.0 g/dL   RDW 15.6 (H) 11.5 - 15.5 %   Platelets 253 150 - 400 K/uL   Neutrophils Relative % 67 %   Neutro Abs 5.7 1.7 - 7.7 K/uL   Lymphocytes Relative 27 %   Lymphs Abs 2.3 0.7 - 4.0 K/uL   Monocytes Relative 5 %   Monocytes Absolute 0.4 0.1 - 1.0 K/uL   Eosinophils Relative 1 %   Eosinophils Absolute 0.1 0.0 - 0.7 K/uL   Basophils Relative 0 %   Basophils Absolute 0.0 0.0 - 0.1 K/uL  Comprehensive metabolic panel     Status: Abnormal   Collection Time: 04/30/15 11:24 AM  Result Value Ref Range   Sodium 141 135 - 145 mmol/L   Potassium 4.7 3.5 - 5.1 mmol/L   Chloride 107 101 - 111 mmol/L   CO2 27 22 - 32 mmol/L   Glucose, Bld 109 (H) 65 - 99 mg/dL   BUN 22 (H) 6 - 20 mg/dL   Creatinine, Ser 1.00 0.44 - 1.00 mg/dL   Calcium 8.9 8.9 - 10.3 mg/dL   Total Protein 7.1 6.5 - 8.1 g/dL   Albumin 3.6 3.5 - 5.0 g/dL   AST 19 15 - 41 U/L   ALT 15 14 - 54 U/L   Alkaline Phosphatase 91 38 - 126  U/L   Total Bilirubin 0.4 0.3 - 1.2 mg/dL   GFR calc non Af Amer 53 (L) >60 mL/min   GFR calc Af Amer >60 >60 mL/min    Comment: (NOTE) The eGFR has been calculated using the CKD EPI equation. This calculation has not been validated in all clinical situations. eGFR's persistently <60 mL/min signify possible Chronic Kidney Disease.    Anion gap 7 5 - 15    Radiological Exams on Admission: Ct Abdomen Pelvis W Contrast  04/30/2015  CLINICAL DATA:  Hematuria 1 day last week with continued left flank pain. Bright red blood per rectum yesterday. EXAM: CT ABDOMEN AND PELVIS WITH CONTRAST TECHNIQUE: Multidetector CT imaging of the abdomen and pelvis was performed using the standard protocol following bolus administration of intravenous contrast. CONTRAST:  100 mL OMNIPAQUE IOHEXOL 300 MG/ML  SOLN COMPARISON:  None. FINDINGS: The lung bases are clear. No pleural or pericardial effusion. Calcific aortic and coronary atherosclerosis is identified. There is cardiomegaly. No pleural or pericardial effusion. The liver is low attenuating consistent with fatty infiltration. No focal liver lesion is seen. The gallbladder, spleen, adrenal glands, pancreas and kidneys are unremarkable. Dense aortoiliac atherosclerosis without aneurysm is identified. Uterus, adnexa and urinary bladder appear normal. A few scattered diverticula are seen along the sigmoid and descending colon but there is no evidence of diverticulitis. The colon is otherwise normal appearance. The stomach and small bowel appear normal. There is no lymphadenopathy or fluid collection. No lytic or sclerotic bony lesion is identified. The patient is status post L4-S1 fusion. There is vacuum disc phenomenon and loss of disc space height at L3-4. IMPRESSION: No acute abnormality or finding to explain the patient's symptoms. Mild diverticulosis without diverticulitis. Fatty infiltration of the liver. Calcific aortic and coronary atherosclerosis. Electronically  Signed   By: Inge Rise M.D.   On: 04/30/2015 13:07    Assessment/Plan Principal Problem:   Rectal bleeding Active Problems:   CAD (coronary artery disease)   Acute blood loss anemia  Rectal bleed    Rectal bleeding -Likely represents a diverticular bleed. -Admit to stepdown unit, place 2 units of PRBCs on hold, cycle CBCs every 8 hours and transfuse for hemoglobin less than 9 given history of coronary artery disease. -Discussed with Dr. Melony Overly who plans on colonoscopy in a.m.  Acute blood loss anemia -Secondary to rectal bleeding, see above for details.  History of coronary artery disease -Remote status post stent placement to the RCA in 1989 per patient report. -Currently stable, no chest pain.  DVT prophylaxis -SCDs given active GI bleed  CODE STATUS -Full code    Time Spent on Admission: 80 minutes  HERNANDEZ ACOSTA,ESTELA [facility] Pager: [phone] 04/30/2015, 5:18 PM

## 2015-04-30 NOTE — ED Provider Notes (Signed)
History  By signing my name below, I, Marlowe Kays, attest that this documentation has been prepared under the direction and in the presence of Milton Ferguson, MD. Electronically Signed: Marlowe Kays, ED Scribe. 04/30/2015. 11:11 AM.  Chief Complaint  Patient presents with  . Rectal Bleeding   Patient is a 75 y.o. female presenting with hematochezia. The history is provided by the patient and medical records. No language interpreter was used.  Rectal Bleeding Associated symptoms: abdominal pain and light-headedness   Associated symptoms: no fever     HPI Comments:  Erin Cooke is a 75 y.o. female who presents to the Emergency Department complaining of improving hematochezia that began yesterday. She states the blood was dark and bright red and malodorous. She reports associated mild abdominal pain, nausea and light-headedness today. Pt states she was seen by an NP at the internal medicine office yesterday and tried to get a referral to a gastroenterologist. She was prescribed Cipro and Flagyl in which she reports having two doses. She denies modifying factors. She denies fever. Pt states she normally takes a daily ASA but did not take it last night secondary to the bleeding. Pt states her last colonoscopy was 4 years ago and no polyps were found, only microscopic colitis.  Past Medical History  Diagnosis Date  . PONV (postoperative nausea and vomiting)     states Forane elevated her liver enzymes  . Hyperlipidemia     takes Atorvastatin daily  . Coronary artery disease   . Myocardial infarction ([place]) 1989  . Palpitations     takes betapace daily  . Arthritis   . Cancer (HCC)     breast  . Chronic back pain     synovial cyst,radiculopathy,and stenosis  . GERD (gastroesophageal reflux disease)     takes OTC Prilosec daily  . Erosive esophagitis   . History of colon polyps   . Microscopic colitis   . Stress incontinence   . Thyroid nodule     benign   Past Surgical  History  Procedure Laterality Date  . Rotator cuff repair Left 1989  . Ankle surgery Right 1990    pins and plate  . Cardiac catheterization  1989/1999  . Coronary angioplasty      2 stents  . Rotator cuff repair Right 2001  . Carpal tunnel release Right 2004  . Back surgery  2011  . Spinal fusion  2012  . Total ankle replacement Right 2012  . Mastectomy, partial Right 2013  . Dilation and curettage of uterus  2014  . Cataract surgery with corneal transplant  Right 2008  . Cataract surgery with corneal transplant  Left 2014  . Eye lid surgery Bilateral 2014  . Esophagogastroduodenoscopy    . Colonoscopy    . Biopsy thyroid     No family history on file. Social History  Substance Use Topics  . Smoking status: Never Smoker   . Smokeless tobacco: None  . Alcohol Use: No   OB History    No data available     Review of Systems  Constitutional: Negative for fever, appetite change and fatigue.  HENT: Negative for congestion, ear discharge and sinus pressure.   Eyes: Negative for discharge.  Respiratory: Negative for cough.   Cardiovascular: Negative for chest pain.  Gastrointestinal: Positive for nausea, abdominal pain, blood in stool and hematochezia. Negative for diarrhea.  Genitourinary: Negative for frequency and hematuria.  Musculoskeletal: Negative for back pain.  Skin: Negative for rash.  Neurological: Positive  for light-headedness. Negative for seizures and headaches.  Psychiatric/Behavioral: Negative for hallucinations.    Allergies  Forane  Home Medications   Prior to Admission medications   Medication Sig Start Date End Date Taking? Authorizing Provider  aspirin EC 81 MG tablet Take 81 mg by mouth daily.   Yes Historical Provider, MD  atorvastatin (LIPITOR) 80 MG tablet Take 80 mg by mouth at bedtime.   Yes Historical Provider, MD  BIOTIN 5000 PO Take 2 tablets by mouth daily.   Yes Historical Provider, MD  Calcium Carbonate-Vitamin D (CALCIUM + D PO) Take  1 tablet by mouth 2 (two) times daily.   Yes Historical Provider, MD  ciprofloxacin (CIPRO) 250 MG tablet Take 250 mg by mouth 2 (two) times daily.   Yes Historical Provider, MD  Loperamide HCl (IMODIUM PO) Take 1 tablet by mouth daily as needed (for diarrhea).   Yes Historical Provider, MD  losartan (COZAAR) 50 MG tablet Take 50 mg by mouth daily.   Yes Historical Provider, MD  Melatonin 5 MG CAPS Take 1 capsule by mouth at bedtime.   Yes Historical Provider, MD  metroNIDAZOLE (FLAGYL) 250 MG tablet Take 250 mg by mouth 2 (two) times daily.   Yes Historical Provider, MD  Multiple Vitamin (MULTIVITAMIN WITH MINERALS) TABS tablet Take 1 tablet by mouth daily.   Yes Historical Provider, MD  naproxen (NAPROSYN) 500 MG tablet Take 500 mg by mouth daily.   Yes Historical Provider, MD  nitroGLYCERIN (NITROSTAT) 0.4 MG SL tablet Place 0.4 mg under the tongue every 5 (five) minutes as needed for chest pain.   Yes Historical Provider, MD  omeprazole (PRILOSEC OTC) 20 MG tablet Take 20 mg by mouth every morning.   Yes Historical Provider, MD  prednisoLONE acetate (PRED FORTE) 1 % ophthalmic suspension Place 1 drop into both eyes daily.   Yes Historical Provider, MD  Probiotic Product ([place]) Take 1 capsule by mouth daily.   Yes Historical Provider, MD  Propylene Glycol (SYSTANE BALANCE OP) Apply 1 drop to eye daily.   Yes Historical Provider, MD  sotalol (BETAPACE) 80 MG tablet Take 80 mg by mouth 2 (two) times daily.   Yes Historical Provider, MD  oxyCODONE-acetaminophen (ROXICET) 5-325 MG per tablet Take 1 tablet by mouth every 6 (six) hours as needed for severe pain. Patient not taking: Reported on 04/30/2015 10/13/13   Ashok Pall, MD   Triage Vitals: BP 146/82 mmHg  Pulse 84  Temp(Src) 97.6 F (36.4 C) (Oral)  Resp 20  Ht 5\' 1"  (1.549 m)  Wt 200 lb (90.719 kg)  BMI 37.81 kg/m2  SpO2 100% Physical Exam  Constitutional: She is oriented to person, place, and time. She appears  well-developed.  HENT:  Head: Normocephalic.  Eyes: Conjunctivae and EOM are normal. No scleral icterus.  Neck: Neck supple. No thyromegaly present.  Cardiovascular: Normal rate and regular rhythm.  Exam reveals no gallop and no friction rub.   No murmur heard. Pulmonary/Chest: Effort normal. No stridor. She has no wheezes. She has no rales. She exhibits no tenderness.  Abdominal: Soft. She exhibits no distension. There is no tenderness. There is no rebound.  Musculoskeletal: Normal range of motion. She exhibits no edema.  Lymphadenopathy:    She has no cervical adenopathy.  Neurological: She is alert and oriented to person, place, and time. She exhibits normal muscle tone. Coordination normal.  Skin: No rash noted. No erythema.  Psychiatric: She has a normal mood and affect. Her behavior  is normal.  Nursing note and vitals reviewed.   ED Course  Procedures (including critical care time) DIAGNOSTIC STUDIES: Oxygen Saturation is 100% on RA, normal by my interpretation.   COORDINATION OF CARE: 11:17 AM- Will order labs. Pt verbalizes understanding and agrees to plan.  Medications - No data to display  Labs Review Labs Reviewed - No data to display  Imaging Review No results found. I have personally reviewed and evaluated these images and lab results as part of my medical decision-making.   EKG Interpretation None      MDM   Final diagnoses:  None    Patient will be admitted for GI bleed. She will be put on clear liquids tonight and Dr. Wonda Amis will do a colonoscopy tomorrow  The chart was scribed for me under my direct supervision.  I personally performed the history, physical, and medical decision making and all procedures in the evaluation of this patient.Milton Ferguson, MD 04/30/15 [phone]

## 2015-04-30 NOTE — Consult Note (Signed)
Referring Provider: Thersa Salt, MD  Primary Care Physician:  PROVIDER NOT IN SYSTEM Primary Gastroenterologist:  Dr. Laural Golden  Reason for Consultation:   GI bleed.  HPI:   Patient is 25 old Caucasian female who was in usual state of health until yesterday morning when she had an urge to have a bowel movement and stent pass large amount of dark and fresh blood. She did not experience abdominal pain fever or chills. She was seen by her PCP. She was hemodynamically stable. She was given prescription for Cipro and metronidazole and advised to call our first for appointment. This morning she had another bloody bowel movement and she also noted fresh blood. She felt weak and dizzy. She did not experience nausea vomiting chest pain or shortness of breath. Patient called our office and given her symptoms she was advised to go to emergency room. Patient has been evaluated by Dr. Roderic Palau and is in the process of moving admitted to Dr. Cresenciano Lick service. She has not passed any blood since this morning. She denies abdominal pain nausea or vomiting. Her bowels usually move regularly. Every now and then she has a couple of loose stools for which she uses Imodium on an as-needed basis. She has history of microscopic colitis determined on her last colonoscopy of September 2012. She has had polyps on prior colonoscopies. There is no history of peptic ulcer disease. She has history of erosive reflux esophagitis which she believes was secondary to NSAID therapy years ago that she took while she was participating in trial. He is on low-dose aspirin but does not take other NSAIDs. I specifically asked her if she was taking Naprosyn and she is not. She has very good appetite and denies recent weight loss. Heartburn is well controlled with omeprazole. 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 She had radiation therapy for breast  carcinoma following surgery. She had abdominopelvic CT with contrast revealing left-sided diverticula but no evidence of diverticulitis. She also had fatty liver, calcific aortic and coronary atherosclerosis.    Past Medical History  Diagnosis Date  . PONV (postoperative nausea and vomiting)     states Forane elevated her liver enzymes  . Hyperlipidemia     takes Atorvastatin daily  . Coronary artery disease   . Myocardial infarction ([place]) 1989  . Palpitations     takes betapace daily  . Arthritis   . Cancer ([place])     Breast; treated with surgery and RT  . Chronic back pain     synovial cyst,radiculopathy,and stenosis  . GERD (gastroesophageal reflux disease)     takes OTC Prilosec daily  . Erosive esophagitis   . History of colon polyps   . Microscopic colitis   . Stress incontinence   . Thyroid nodule     benign    Past Surgical History  Procedure Laterality Date  . Rotator cuff repair Left 1989  . Ankle surgery Right 1990    pins and plate  . Cardiac catheterization  1989/1999  . Coronary angioplasty      2 stents  . Rotator cuff repair Right 2001  . Carpal tunnel release Right 2004  . Back surgery  2011  . Spinal fusion  2012  . Total ankle replacement Right 2012  . Mastectomy, partial Right 2013  . Dilation and curettage of uterus  2014  . 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 Eye lid surgery Bilateral 2014  . Esophagogastroduodenoscopy    . Colonoscopy    . Biopsy thyroid      Prior to Admission medications   Medication Sig Start Date End Date Taking? Authorizing Provider  aspirin EC 81 MG tablet Take 81 mg by mouth daily.   Yes Historical Provider, MD  atorvastatin (LIPITOR) 80 MG tablet Take 80 mg by mouth at bedtime.   Yes Historical Provider, MD  BIOTIN 5000 PO Take 2 tablets by mouth daily.   Yes Historical Provider, MD  Calcium  Carbonate-Vitamin D (CALCIUM + D PO) Take 1 tablet by mouth 2 (two) times daily.   Yes Historical Provider, MD  ciprofloxacin (CIPRO) 250 MG tablet Take 250 mg by mouth 2 (two) times daily.   Yes Historical Provider, MD  Loperamide HCl (IMODIUM PO) Take 1 tablet by mouth daily as needed (for diarrhea).   Yes Historical Provider, MD  losartan (COZAAR) 50 MG tablet Take 50 mg by mouth daily.   Yes Historical Provider, MD  Melatonin 5 MG CAPS Take 1 capsule by mouth at bedtime.   Yes Historical Provider, MD  metroNIDAZOLE (FLAGYL) 250 MG tablet Take 250 mg by mouth 2 (two) times daily.   Yes Historical Provider, MD  Multiple Vitamin (MULTIVITAMIN WITH MINERALS) TABS tablet Take 1 tablet by mouth daily.   Yes Historical Provider, MD         nitroGLYCERIN (NITROSTAT) 0.4 MG SL tablet Place 0.4 mg under the tongue every 5 (five) minutes as needed for chest pain.   Yes Historical Provider, MD  omeprazole (PRILOSEC OTC) 20 MG tablet Take 20 mg by mouth every morning.   Yes Historical Provider, MD  prednisoLONE acetate (PRED FORTE) 1 % ophthalmic suspension Place 1 drop into both eyes daily.   Yes Historical Provider, MD  Probiotic Product ([place]) Take 1 capsule by mouth daily.   Yes Historical Provider, MD  Propylene Glycol (SYSTANE BALANCE OP) Apply 1 drop to eye daily.   Yes Historical Provider, MD  sotalol (BETAPACE) 80 MG tablet Take 80 mg by mouth 2 (two) times daily.   Yes Historical Provider, MD  oxyCODONE-acetaminophen (ROXICET) 5-325 MG per tablet Take 1 tablet by mouth every 6 (six) hours as needed for severe pain. Patient not taking: Reported on 04/30/2015 10/13/13   Ashok Pall, MD    Current Facility-Administered Medications  Medication Dose Route Frequency Provider Last Rate Last Dose  . diatrizoate meglumine-sodium (GASTROGRAFIN) 66-10 % solution            Current Outpatient Prescriptions  Medication Sig Dispense Refill  . aspirin EC 81 MG tablet Take 81 mg by mouth  daily.    Marland Kitchen atorvastatin (LIPITOR) 80 MG tablet Take 80 mg by mouth at bedtime.    Marland Kitchen BIOTIN 5000 PO Take 2 tablets by mouth daily.    . Calcium Carbonate-Vitamin D (CALCIUM + D PO) Take 1 tablet by mouth 2 (two) times daily.    . ciprofloxacin (CIPRO) 250 MG tablet Take 250 mg by mouth 2 (two) times daily.    . Loperamide HCl (IMODIUM PO) Take 1 tablet by mouth daily as needed (for diarrhea).    . losartan (COZAAR) 50 MG tablet Take 50 mg by mouth daily.    . Melatonin 5 MG CAPS Take 1 capsule by mouth at bedtime.    . metroNIDAZOLE (FLAGYL) 250 MG tablet Take 250 mg by  mouth 2 (two) times daily.    . Multiple Vitamin (MULTIVITAMIN WITH MINERALS) TABS tablet Take 1 tablet by mouth daily.    .      . nitroGLYCERIN (NITROSTAT) 0.4 MG SL tablet Place 0.4 mg under the tongue every 5 (five) minutes as needed for chest pain.    Marland Kitchen omeprazole (PRILOSEC OTC) 20 MG tablet Take 20 mg by mouth every morning.    . prednisoLONE acetate (PRED FORTE) 1 % ophthalmic suspension Place 1 drop into both eyes daily.    . Probiotic Product ([place]IPS COLON HEALTH PO) Take 1 capsule by mouth daily.    Marland Kitchen Propylene Glycol (SYSTANE BALANCE OP) Apply 1 drop to eye daily.    . sotalol (BETAPACE) 80 MG tablet Take 80 mg by mouth 2 (two) times daily.    Marland Kitchen oxyCODONE-acetaminophen (ROXICET) 5-325 MG per tablet Take 1 tablet by mouth every 6 (six) hours as needed for severe pain. (Patient not taking: Reported on 04/30/2015) 80 tablet 0    Allergies as of 04/30/2015 - Review Complete 04/30/2015  Allergen Reaction Noted  . Forane [isoflurane] Other (See Comments) 10/03/2013   Family history:  Family history significant for colon carcinoma in 3 of her maternal uncles and they were in their 48s at the time of diagnosis. Family history is also significant for breast carcinoma in 3 of her sisters and there were in her 76s. 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 He is widowed. Her husband died of pancreatic carcinoma within a year of diagnosis. He was in his early 55s. She has one son. She does not smoke cigarettes or drink alcohol.   Review of Systems: See HPI, otherwise normal ROS  Physical Exam: Temp:  [97.6 F (36.4 C)] 97.6 F (36.4 C) (01/11 1049) Pulse Rate:  [63-88] 63 (01/11 1352) Resp:  [16-20] 16 (01/11 1352) BP: (125-146)/(50-82) 125/58 mmHg (01/11 1352) SpO2:  [99 %-100 %] 99 % (01/11 1352) Weight:  [200 lb (90.719 kg)] 200 lb (90.719 kg) (01/11 1049)   Patient is alert and in no acute distress. Conjunctiva is pink. Sclerae nonicteric. Oropharyngeal mucosa is normal indentation in satisfactory condition. No neck masses or thyromegaly noted. No carotid bruits noted. Cardiac exam with regular rhythm normal S1 and S2. No murmur or gallop noted. Lungs are clear to auscultation. Abdomen is full with home in bowel sounds. On palpation abdomen is soft and nontender without organomegaly or masses. No prior for edema or clubbing noted.   Lab Results:  Recent Labs  04/30/15 1124  WBC 8.5  HGB 10.9*  HCT 34.3*  PLT 253   BMET  Recent Labs  04/30/15 1124  NA 141  K 4.7  CL 107  CO2 27  GLUCOSE 109*  BUN 22*  CREATININE 1.00  CALCIUM 8.9   LFT  Recent Labs  04/30/15 1124  PROT 7.1  ALBUMIN 3.6  AST 19  ALT 15  ALKPHOS 91  BILITOT 0.4    Studies/Results: Ct Abdomen Pelvis W Contrast  04/30/2015  CLINICAL DATA:  Hematuria 1 day last week with continued left flank pain. Bright red blood per rectum yesterday. EXAM: CT ABDOMEN AND PELVIS WITH CONTRAST TECHNIQUE: Multidetector CT imaging of the abdomen and pelvis was performed using the standard protocol following bolus administration of intravenous contrast. CONTRAST:  100 mL OMNIPAQUE IOHEXOL 300 MG/ML  SOLN COMPARISON:  None. FINDINGS: The lung bases are  clear. No pleural or pericardial effusion. Calcific aortic and coronary atherosclerosis is identified. There is cardiomegaly. No pleural or pericardial effusion. The liver is low attenuating consistent with fatty infiltration. No focal liver lesion is seen. The gallbladder, spleen, adrenal glands, pancreas and kidneys are unremarkable. Dense aortoiliac atherosclerosis without aneurysm is identified. Uterus, adnexa and urinary bladder appear normal. A few scattered diverticula are seen along the sigmoid and descending colon but there is no evidence of diverticulitis. The colon is otherwise normal appearance. The stomach and small bowel appear normal. There is no lymphadenopathy or fluid collection. No lytic or sclerotic bony lesion is identified. The patient is status post L4-S1 fusion. There is vacuum disc phenomenon and loss of disc space height at L3-4. IMPRESSION: No acute abnormality or finding to explain the patient's symptoms. Mild diverticulosis without diverticulitis. Fatty infiltration of the liver. Calcific aortic and coronary atherosclerosis. Electronically Signed   By: Inge Rise M.D.   On: 04/30/2015 13:07    Assessment;  Acute GI bleed felt to be colonic diverticular bleed until proven otherwise. She is hemodynamically stable at the present time but did experience postural symptoms prior to arriving at this facility. She has mild anemia and hemoglobin is expected to drop further. She has been typed and screened.  Recommendations;  Serial H&H's over the next 24 hours. INR with next blood draw. Diagnostic colonoscopy on 05/01/2015. She will be prepped early tomorrow morning.   LOS: 0 days   Maddix Heinz U  04/30/2015, 4:04 PM

## 2015-05-01 ENCOUNTER — Encounter (HOSPITAL_COMMUNITY)
Admission: EM | Disposition: A | Discharge status: Home / Self Care | Payer: Self-pay | Source: Home / Self Care | Attending: Internal Medicine

## 2015-05-01 ENCOUNTER — Encounter (HOSPITAL_COMMUNITY): Payer: Self-pay | Admitting: *Deleted

## 2015-05-01 DIAGNOSIS — K5730 Diverticulosis of large intestine without perforation or abscess without bleeding: Secondary | ICD-10-CM

## 2015-05-01 DIAGNOSIS — D125 Benign neoplasm of sigmoid colon: Secondary | ICD-10-CM

## 2015-05-01 DIAGNOSIS — D123 Benign neoplasm of transverse colon: Secondary | ICD-10-CM

## 2015-05-01 DIAGNOSIS — D124 Benign neoplasm of descending colon: Secondary | ICD-10-CM

## 2015-05-01 DIAGNOSIS — K648 Other hemorrhoids: Secondary | ICD-10-CM

## 2015-05-01 DIAGNOSIS — C182 Malignant neoplasm of ascending colon: Secondary | ICD-10-CM

## 2015-05-01 HISTORY — PX: COLONOSCOPY: SHX5424

## 2015-05-01 LAB — BASIC METABOLIC PANEL
Anion gap: 8 (ref 5–15)
BUN: 18 mg/dL (ref 6–20)
CHLORIDE: 107 mmol/L (ref 101–111)
CO2: 25 mmol/L (ref 22–32)
Calcium: 8.7 mg/dL — ABNORMAL LOW (ref 8.9–10.3)
Creatinine, Ser: 1.02 mg/dL — ABNORMAL HIGH (ref 0.44–1.00)
GFR calc Af Amer: >60 mL/min (ref >60)
GFR calc non Af Amer: 52 mL/min — ABNORMAL LOW (ref >60)
GLUCOSE: 93 mg/dL (ref 65–99)
POTASSIUM: 4.3 mmol/L (ref 3.5–5.1)
SODIUM: 140 mmol/L (ref 135–145)

## 2015-05-01 LAB — CBC
HCT: 32.6 % — ABNORMAL LOW (ref 36.0–46.0)
HEMATOCRIT: 35.7 % — AB (ref 36.0–46.0)
HEMOGLOBIN: 10.2 g/dL — AB (ref 12.0–15.0)
Hemoglobin: 11.2 g/dL — ABNORMAL LOW (ref 12.0–15.0)
MCH: 25.8 pg — ABNORMAL LOW (ref 26.0–34.0)
MCH: 26.2 pg (ref 26.0–34.0)
MCHC: 31.3 g/dL (ref 30.0–36.0)
MCHC: 31.4 g/dL (ref 30.0–36.0)
MCV: 82.3 fL (ref 78.0–100.0)
MCV: 83.4 fL (ref 78.0–100.0)
Platelets: 221 10*3/uL (ref 150–400)
Platelets: 212 10*3/uL (ref 150–400)
RBC: 3.96 MIL/uL (ref 3.87–5.11)
RBC: 4.28 MIL/uL (ref 3.87–5.11)
RDW: 15.7 % — ABNORMAL HIGH (ref 11.5–15.5)
RDW: 15.7 % — AB (ref 11.5–15.5)
WBC: 8 10*3/uL (ref 4.0–10.5)
WBC: 9 10*3/uL (ref 4.0–10.5)

## 2015-05-01 SURGERY — COLONOSCOPY
Anesthesia: Moderate Sedation

## 2015-05-01 MED ORDER — LOSARTAN POTASSIUM 50 MG PO TABS
50.0000 mg | ORAL_TABLET | Freq: Every day | ORAL | Status: DC
  Administered 2015-05-01 – 2015-05-02 (×2): 50 mg via ORAL
  Filled 2015-05-01 (×2): qty 1

## 2015-05-01 MED ORDER — SOTALOL HCL 80 MG PO TABS
80.0000 mg | ORAL_TABLET | Freq: Two times a day (BID) | ORAL | Status: DC
  Administered 2015-05-02: 80 mg via ORAL
  Filled 2015-05-01 (×8): qty 1

## 2015-05-01 MED ORDER — MIDAZOLAM HCL 5 MG/5ML IJ SOLN
INTRAMUSCULAR | Status: AC
  Filled 2015-05-01: qty 10

## 2015-05-01 MED ORDER — MEPERIDINE HCL 50 MG/ML IJ SOLN
INTRAMUSCULAR | Status: AC
  Filled 2015-05-01: qty 1

## 2015-05-01 MED ORDER — MEPERIDINE HCL 50 MG/ML IJ SOLN
INTRAMUSCULAR | Status: DC | PRN
  Administered 2015-05-01 (×2): 25 mg via INTRAVENOUS

## 2015-05-01 MED ORDER — MIDAZOLAM HCL 5 MG/5ML IJ SOLN
INTRAMUSCULAR | Status: DC | PRN
  Administered 2015-05-01: 1 mg via INTRAVENOUS
  Administered 2015-05-01: 2 mg via INTRAVENOUS
  Administered 2015-05-01 (×2): 1 mg via INTRAVENOUS

## 2015-05-01 MED ORDER — SOTALOL HCL 80 MG PO TABS
80.0000 mg | ORAL_TABLET | ORAL | Status: AC
  Administered 2015-05-01: 80 mg via ORAL
  Filled 2015-05-01: qty 1

## 2015-05-01 MED ORDER — SIMETHICONE 40 MG/0.6ML PO SUSP
ORAL | Status: DC | PRN
  Administered 2015-05-01: 2.5 mL

## 2015-05-01 MED ORDER — ATORVASTATIN CALCIUM 40 MG PO TABS
80.0000 mg | ORAL_TABLET | Freq: Every day | ORAL | Status: DC
  Administered 2015-05-01: 80 mg via ORAL
  Filled 2015-05-01: qty 2

## 2015-05-01 MED ORDER — SOTALOL HCL 80 MG PO TABS
ORAL_TABLET | ORAL | Status: AC
  Filled 2015-05-01: qty 1

## 2015-05-01 MED ORDER — OMEPRAZOLE MAGNESIUM 20 MG PO TBEC: 20.0000 mg | DELAYED_RELEASE_TABLET | Freq: Every morning | ORAL | Status: DC

## 2015-05-01 MED ORDER — SODIUM CHLORIDE 0.9 % IV SOLN
INTRAVENOUS | Status: DC
  Administered 2015-05-01: 08:00:00 via INTRAVENOUS

## 2015-05-01 MED ORDER — PANTOPRAZOLE SODIUM 40 MG PO TBEC
40.0000 mg | DELAYED_RELEASE_TABLET | Freq: Every day | ORAL | Status: DC
  Administered 2015-05-01 – 2015-05-02 (×2): 40 mg via ORAL
  Filled 2015-05-01 (×2): qty 1

## 2015-05-01 NOTE — Progress Notes (Signed)
Up unassisted to bathroom, voided, no complaints.

## 2015-05-01 NOTE — Op Note (Addendum)
COLONOSCOPY PROCEDURE REPORT  PATIENT:  Erin Cooke  MR#:  [id number] Birthdate:  1940-12-13, 75 y.o., female Endoscopist:  Dr. Rogene Houston, MD Referred By:  Dr. Thersa Salt, M.D.  Procedure Date: 05/01/2015  Procedure:   Colonoscopy  Indications:  Patient is 75 year old Caucasian female who presents with large volume hematochezia and anemia. She is undergoing diagnostic colonoscopy. Last colonoscopy was about 4 years ago in [place].  Informed Consent:  The procedure and risks were reviewed with the patient and informed consent was obtained.  Medications:  Demerol 50 mg IV Versed 5 mg IV  First dose administered at 2:51 PM Last dose administered at  3:19 PM  Scope out 3:35 PM  Description of procedure:  After a digital rectal exam was performed, that colonoscope was advanced from the anus through the rectum and colon to the area of the cecum, ileocecal valve and appendiceal orifice. The cecum was deeply intubated. These structures were well-seen and photographed for the record. From the level of the cecum and ileocecal valve, the scope was slowly and cautiously withdrawn. The mucosal surfaces were carefully surveyed utilizing scope tip to flexion to facilitate fold flattening as needed. The scope was pulled down into the rectum where a thorough exam including retroflexion was performed.  Findings:  Prep excellent. Large ulcerated mass noted just above ileocecal valve involving one third of the circumference. Multiple biopsies taken. 10 mm polyp proximal to this mass was left alone. Small polyp ablated by cold biopsy from hepatic flexure on the way in. 7 mm polyp hot snared from splenic flexure. 4 mm polyp cold snared from descending colon. 10 x 15 mm sessile polyp snared piecemeal from proximal sigmoid colon and to 360 clips applied to polypectomy site. Few diverticula at sigmoid colon. Normal rectal mucosa. Small hemorrhoids below the dentate  line.    Therapeutic/Diagnostic Maneuvers Performed:  See above  Complications:  None  EBL: Minimal  Cecal Withdrawal Time:  20 minutes  Impression:  Examination performed to cecum. Large ulcerated mass at ascending colon just above ileocecal valve. Endoscopic appearance consistent with adenocarcinoma. Multiple biopsies taken. 10 mm polyp at ascending colon was not removed. Small polyp at hepatic flexure ablated via cold biopsy. 7 mm polyp was hot snared from splenic flexure and submitted along with 4 mm polyp that was cold snared from descending colon. 10 x 15 mm sessile polyp snared piecemeal from proximal sigmoid colon into 360 clips applied and polypectomy site. Mild sigmoid colon diverticulosis. Small external hemorrhoids   Recommendations:  CEA. Advance diet. Further recommendations to be made after biopsy results available.  Elton Heid U  05/01/2015 3:50 PM  CC: Dr. PROVIDER NOT IN SYSTEM & Dr. No ref. provider found

## 2015-05-01 NOTE — Progress Notes (Signed)
Pt a/o.vss. IV patent. Pt up sitting in the recliner. No complaints of any distress. Report given to L.Bullins,RN. Pt to be transferred to dept 300.

## 2015-05-01 NOTE — Progress Notes (Signed)
Arrived to room via wheelchair accompanied by endo staff, assisted to bed, new bag of normal saline hanging, patient alert and oriented, requesting coffee.  Orders released.

## 2015-05-01 NOTE — Progress Notes (Signed)
Patient ID: Erin Cooke, female   DOB: October 18, 1940, 75 y.o.   MRN: [id number] Feel better. Taking Golytely at this time. She has drank 1/2 of the Golytely. She just had a colonoscopy four years ago by Dr. West Carbo.  Very pleasant this am. Blood pressure 138/65, pulse 84, temperature 97.8 F (36.6 C), temperature source Oral, resp. rate 21, height 5\' 1"  (1.549 m), weight 200 lb 9.9 oz (91 kg), SpO2 97 %. She is scheduled for a colonoscopy this am.  Has been NPO this am.

## 2015-05-01 NOTE — Care Management Note (Signed)
Case Management Note  Patient Details  Name: Erin Cooke MRN: [id number] Date of Birth: 16-Apr-1941  Subjective/Objective:                  Pt is from home, lives alone and is ind with ADL's. Pt plans to return home with self care at DC.   Action/Plan: No CM needs anticipated. Will cont to follow.   Expected Discharge Date:      05/02/2015            Expected Discharge Plan:  Home/Self Care  In-House Referral:  NA  Discharge planning Services  NA  Post Acute Care Choice:  NA Choice offered to:  NA  DME Arranged:    DME Agency:     HH Arranged:    HH Agency:     Status of Service:  Completed, signed off  Medicare Important Message Given:    Date Medicare IM Given:    Medicare IM give by:    Date Additional Medicare IM Given:    Additional Medicare Important Message give by:     If discussed at [place] of Stay Meetings, dates discussed:    Additional Comments:  Sherald Barge, RN 05/01/2015, 11:29 AM

## 2015-05-01 NOTE — Progress Notes (Signed)
[facility] PROGRESS NOTE  LEAL MARX [id number] DOB: July 20, 1940 DOA: 04/30/2015 PCP: PROVIDER NOT IN SYSTEM  Assessment/Plan: Rectal Bleeding -Likely a diverticular bleed. -For colonoscopy today. -Appreciate Dr. Olevia Perches input and recommendations. -Bleeding has cleared as per her last 2 BMs.  ABLA -2/2 rectal bleed. -Hb stable at 10.2. -Consider transfusion for Hb <9 given CAD.  H/o CAD -Stable, no CP.  Code Status: Full code Family Communication: Sister at bedside updated on plan of care  Disposition Plan: Home when ready, likely in 24 hours. Transfer to floor.   Consultants:  GI   Antibiotics:  None   Subjective: Feels well. Had some bloody BMs with bowel prep, but seems to have cleared.  Objective: Filed Vitals:   05/01/15 0300 05/01/15 0400 05/01/15 0500 05/01/15 0850  BP: 132/71 141/71 138/65 140/77  Pulse: 71 73 84 88  Temp:  97.8 F (36.6 C)    TempSrc:  Oral    Resp: 17 15 21 22   Height:      Weight:   91 kg (200 lb 9.9 oz)   SpO2: 98% 97% 97% 97%    Intake/Output Summary (Last 24 hours) at 05/01/15 1015 Last data filed at 05/01/15 0348  Gross per 24 hour  Intake      0 ml  Output    350 ml  Net   -350 ml   Filed Weights   04/30/15 1049 04/30/15 1640 05/01/15 0500  Weight: 90.719 kg (200 lb) 89.6 kg (197 lb 8.5 oz) 91 kg (200 lb 9.9 oz)    Exam:   General:  AA Ox3  Cardiovascular: RRR  Respiratory: CTA B  Abdomen: S/NT/ND/+BS  Extremities: no C/C/E   Neurologic:  nonfocal  Data Reviewed: Basic Metabolic Panel:  Recent Labs Lab 04/30/15 1124 05/01/15 0114  NA 141 140  K 4.7 4.3  CL 107 107  CO2 27 25  GLUCOSE 109* 93  BUN 22* 18  CREATININE 1.00 1.02*  CALCIUM 8.9 8.7*   Liver Function Tests:  Recent Labs Lab 04/30/15 1124  AST 19  ALT 15  ALKPHOS 91  BILITOT 0.4  PROT 7.1  ALBUMIN 3.6   No results for input(s): LIPASE, AMYLASE in the last 168 hours. No results for input(s):  AMMONIA in the last 168 hours. CBC:  Recent Labs Lab 04/30/15 1124 04/30/15 1800 05/01/15 0114 05/01/15 0915  WBC 8.5 9.0 8.0 9.0  NEUTROABS 5.7  --   --   --   HGB 10.9* 10.5* 10.2* 11.2*  HCT 34.3* 33.7* 32.6* 35.7*  MCV 83.1 82.8 82.3 83.4  PLT 253 243 221 212   Cardiac Enzymes: No results for input(s): CKTOTAL, CKMB, CKMBINDEX, TROPONINI in the last 168 hours. BNP (last 3 results) No results for input(s): BNP in the last 8760 hours.  ProBNP (last 3 results) No results for input(s): PROBNP in the last 8760 hours.  CBG: No results for input(s): GLUCAP in the last 168 hours.  Recent Results (from the past 240 hour(s))  MRSA PCR Screening     Status: None   Collection Time: 04/30/15  6:40 PM  Result Value Ref Range Status   MRSA by PCR NEGATIVE NEGATIVE Final    Comment:        The GeneXpert MRSA Assay (FDA approved for NASAL specimens only), is one component of a comprehensive MRSA colonization surveillance program. It is not intended to diagnose MRSA infection nor to guide or monitor treatment for MRSA infections.  Studies: Ct Abdomen Pelvis W Contrast  04/30/2015  CLINICAL DATA:  Hematuria 1 day last week with continued left flank pain. Bright red blood per rectum yesterday. EXAM: CT ABDOMEN AND PELVIS WITH CONTRAST TECHNIQUE: Multidetector CT imaging of the abdomen and pelvis was performed using the standard protocol following bolus administration of intravenous contrast. CONTRAST:  100 mL OMNIPAQUE IOHEXOL 300 MG/ML  SOLN COMPARISON:  None. FINDINGS: The lung bases are clear. No pleural or pericardial effusion. Calcific aortic and coronary atherosclerosis is identified. There is cardiomegaly. No pleural or pericardial effusion. The liver is low attenuating consistent with fatty infiltration. No focal liver lesion is seen. The gallbladder, spleen, adrenal glands, pancreas and kidneys are unremarkable. Dense aortoiliac atherosclerosis without aneurysm is  identified. Uterus, adnexa and urinary bladder appear normal. A few scattered diverticula are seen along the sigmoid and descending colon but there is no evidence of diverticulitis. The colon is otherwise normal appearance. The stomach and small bowel appear normal. There is no lymphadenopathy or fluid collection. No lytic or sclerotic bony lesion is identified. The patient is status post L4-S1 fusion. There is vacuum disc phenomenon and loss of disc space height at L3-4. IMPRESSION: No acute abnormality or finding to explain the patient's symptoms. Mild diverticulosis without diverticulitis. Fatty infiltration of the liver. Calcific aortic and coronary atherosclerosis. Electronically Signed   By: Inge Rise M.D.   On: 04/30/2015 13:07    Scheduled Meds: . sodium chloride   Intravenous Once  . polyvinyl alcohol  1 drop Both Eyes Daily  . prednisoLONE acetate  1 drop Both Eyes Daily   Continuous Infusions: . sodium chloride 75 mL/hr at 05/01/15 0746  . sodium chloride 20 mL/hr at 05/01/15 [id number]    Principal Problem:   Rectal bleeding Active Problems:   CAD (coronary artery disease)   Acute blood loss anemia   Rectal bleed    Time spent: 25 minutes. Greater than 50% of this time was spent in direct contact with the patient coordinating care.    Lelon Frohlich  [facility] Pager [phone]  If 7PM-7AM, please contact night-coverage at [email or web address], password [facility] 05/01/2015, 10:15 AM  LOS: 1 day

## 2015-05-01 NOTE — Progress Notes (Signed)
Report called from endo, 2 clips placed, no MRI for 1 month and will need x-ray prior to MRI.  Waiting patient's arrival to floor.  Family in room.

## 2015-05-02 LAB — HEMOGLOBIN AND HEMATOCRIT, BLOOD
HCT: 31.1 % — ABNORMAL LOW (ref 36.0–46.0)
Hemoglobin: 9.8 g/dL — ABNORMAL LOW (ref 12.0–15.0)

## 2015-05-02 NOTE — Progress Notes (Signed)
IV access removed.  Discharge instructions reviewed with patient, questions answered, understanding verbalized.

## 2015-05-02 NOTE — Progress Notes (Signed)
  Subjective: Patient has no complaints. She denies abdominal pain nausea or vomiting. She is not having headache anymore. Headache resolved after she drank coffee yesterday. She had a bowel movement when she passed small amount of liquid stool and old blood.   Objective: Blood pressure 127/62, pulse 83, temperature 98.7 F (37.1 C), temperature source Oral, resp. rate 16, height 5\' 1"  (1.549 m), weight 200 lb 9.9 oz (91 kg), SpO2 99 %. Patient is alert and in no acute distress. Abdomen is full but soft and nontender without organomegaly or masses. No LE edema or clubbing noted.  Labs/studies Results:   Recent Labs  04/30/15 1800 05/01/15 0114 05/01/15 0915 05/02/15 0608  WBC 9.0 8.0 9.0  --   HGB 10.5* 10.2* 11.2* 9.8*  HCT 33.7* 32.6* 35.7* 31.1*  PLT 243 221 212  --     CEA pending.   Assessment:  #1. 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 Biopsy results pending and would not be out until next week. #2. Anemia secondary to GI bleed which is inactive.  Recommendations:  Patient stable for discharge. I will contact patient with biopsy results on 05/05/2015.

## 2015-05-02 NOTE — Care Management Important Message (Signed)
Important Message  Patient Details  Name: Erin Cooke MRN: [id number] Date of Birth: [birth year]   Medicare Important Message Given:  Yes    Alvie Heidelberg, RN 05/02/2015, 10:00 AM

## 2015-05-02 NOTE — Discharge Summary (Signed)
Physician Discharge Summary  HENESSY MCCRANEY [id number] DOB: 02/27/1941 DOA: 04/30/2015  PCP: PROVIDER NOT IN SYSTEM  Admit date: 04/30/2015 Discharge date: 05/02/2015  Time spent: 45 minutes  Recommendations for Outpatient Follow-up:  -We'll be discharged home today. -Dr. Laural Golden will contact patient with results of colon biopsy to determine further plan of care.   Discharge Diagnoses:  Principal Problem:   Rectal bleeding Active Problems:   CAD (coronary artery disease)   Acute blood loss anemia   Rectal bleed   Discharge Condition: Stable and improved  Filed Weights   04/30/15 1049 04/30/15 1640 05/01/15 0500  Weight: 90.719 kg (200 lb) 89.6 kg (197 lb 8.5 oz) 91 kg (200 lb 9.9 oz)    History of present illness:  Patient is a pleasant 75 year old woman with history of hypertension and remote coronary artery disease who presents to the hospital today with rectal bleeding. She has had 3 episodes beginning yesterday of a large amount of bright red blood per rectum without abdominal pain. She comes into the hospital today for evaluation where she was found to have a hemoglobin of 10.9 down from her normal usual of 12. She has already been seen by GI, Dr. Laural Golden, with plans for colonoscopy in a.m. We have been asked to admit her for further evaluation and management.   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 -Patient's care has been discussed with Dr. Laural Golden; he will notify patient once pathology is available to determine further plan of care. Will likely need surgery followed by oncology care. -No further rectal bleeding overnight.  ABLA -2/2 rectal bleed. -Hb stable at 9.8-10.2. -No need for transfusion this hospitalization  H/o CAD -Stable, no CP.  Procedures: Colonoscopy:  Findings:  Prep excellent. Large ulcerated mass noted just above ileocecal valve  involving one third of the circumference. Multiple biopsies taken. 10 mm polyp proximal to this mass was left alone. Small polyp ablated by cold biopsy from hepatic flexure on the way in. 7 mm polyp hot snared from splenic flexure. 4 mm polyp cold snared from descending colon. 10 x 15 mm sessile polyp snared piecemeal from proximal sigmoid colon and to 360 clips applied to polypectomy site. Few diverticula at sigmoid colon. Normal rectal mucosa.  Small hemorrhoids below the dentate line.  Consultations:  GI  Discharge Instructions  Discharge Instructions    Diet - low sodium heart healthy    Complete by:  As directed      Increase activity slowly    Complete by:  As directed             Medication List    STOP taking these medications        aspirin EC 81 MG tablet     ciprofloxacin 250 MG tablet  Commonly known as:  CIPRO     metroNIDAZOLE 250 MG tablet  Commonly known as:  FLAGYL     naproxen 500 MG tablet  Commonly known as:  NAPROSYN      TAKE these medications        atorvastatin 80 MG tablet  Commonly known as:  LIPITOR  Take 80 mg by mouth at bedtime.     BIOTIN 5000 PO  Take 2 tablets by mouth daily.     CALCIUM + D PO  Take 1 tablet by mouth 2 (two) times daily.     IMODIUM PO  Take 1 tablet by mouth daily as needed (for diarrhea).     losartan 50 MG tablet  Commonly known as:  COZAAR  Take 50 mg by mouth daily.     Melatonin 5 MG Caps  Take 1 capsule by mouth at bedtime.     multivitamin with minerals Tabs tablet  Take 1 tablet by mouth daily.     nitroGLYCERIN 0.4 MG SL tablet  Commonly known as:  NITROSTAT  Place 0.4 mg under the tongue every 5 (five) minutes as needed for chest pain.     omeprazole 20 MG tablet  Commonly known as:  PRILOSEC OTC  Take 20 mg by mouth every morning.     oxyCODONE-acetaminophen 5-325 MG tablet  Commonly known as:  ROXICET  Take 1 tablet by mouth every 6 (six) hours as needed for severe pain.      PHILLIPS COLON HEALTH PO  Take 1 capsule by mouth daily.     prednisoLONE acetate 1 % ophthalmic suspension  Commonly known as:  PRED FORTE  Place 1 drop into both eyes daily.     sotalol 80 MG tablet  Commonly known as:  BETAPACE  Take 80 mg by mouth 2 (two) times daily.     SYSTANE BALANCE OP  Apply 1 drop to eye daily.       Allergies  Allergen Reactions  . Forane [Isoflurane] Other (See Comments)    Elevated liver enzymes      The results of significant diagnostics from this hospitalization (including imaging, microbiology, ancillary and laboratory) are listed below for reference.    Significant Diagnostic Studies: Ct Abdomen Pelvis W Contrast  04/30/2015  CLINICAL DATA:  Hematuria 1 day last week with continued left flank pain. Bright red blood per rectum yesterday. EXAM: CT ABDOMEN AND PELVIS WITH CONTRAST TECHNIQUE: Multidetector CT imaging of the abdomen and pelvis was performed using the standard protocol following bolus administration of intravenous contrast. CONTRAST:  100 mL OMNIPAQUE IOHEXOL 300 MG/ML  SOLN COMPARISON:  None. FINDINGS: The lung bases are clear. No pleural or pericardial effusion. Calcific aortic and coronary atherosclerosis is identified. There is cardiomegaly. No pleural or pericardial effusion. The liver is low attenuating consistent with fatty infiltration. No focal liver lesion is seen. The gallbladder, spleen, adrenal glands, pancreas and kidneys are unremarkable. Dense aortoiliac atherosclerosis without aneurysm is identified. Uterus, adnexa and urinary bladder appear normal. A few scattered diverticula are seen along the sigmoid and descending colon but there is no evidence of diverticulitis. The colon is otherwise normal appearance. The stomach and small bowel appear normal. There is no lymphadenopathy or fluid collection. No lytic or sclerotic bony lesion is identified. The patient is status post L4-S1 fusion. There is vacuum disc phenomenon and loss  of disc space height at L3-4. IMPRESSION: No acute abnormality or finding to explain the patient's symptoms. Mild diverticulosis without diverticulitis. Fatty infiltration of the liver. Calcific aortic and coronary atherosclerosis. Electronically Signed   By: Inge Rise M.D.   On: 04/30/2015 13:07    Microbiology: Recent Results (from the past 240 hour(s))  MRSA PCR Screening     Status: None   Collection Time: 04/30/15  6:40 PM  Result Value Ref Range Status   MRSA by PCR NEGATIVE NEGATIVE Final    Comment:        The GeneXpert MRSA Assay (FDA approved for NASAL specimens only), is one component of a comprehensive MRSA colonization surveillance program. It is not intended to diagnose MRSA infection  nor to guide or monitor treatment for MRSA infections.      Labs: Basic Metabolic Panel:  Recent Labs Lab 04/30/15 1124 05/01/15 0114  NA 141 140  K 4.7 4.3  CL 107 107  CO2 27 25  GLUCOSE 109* 93  BUN 22* 18  CREATININE 1.00 1.02*  CALCIUM 8.9 8.7*   Liver Function Tests:  Recent Labs Lab 04/30/15 1124  AST 19  ALT 15  ALKPHOS 91  BILITOT 0.4  PROT 7.1  ALBUMIN 3.6   No results for input(s): LIPASE, AMYLASE in the last 168 hours. No results for input(s): AMMONIA in the last 168 hours. CBC:  Recent Labs Lab 04/30/15 1124 04/30/15 1800 05/01/15 0114 05/01/15 0915 05/02/15 0608  WBC 8.5 9.0 8.0 9.0  --   NEUTROABS 5.7  --   --   --   --   HGB 10.9* 10.5* 10.2* 11.2* 9.8*  HCT 34.3* 33.7* 32.6* 35.7* 31.1*  MCV 83.1 82.8 82.3 83.4  --   PLT 253 243 221 212  --    Cardiac Enzymes: No results for input(s): CKTOTAL, CKMB, CKMBINDEX, TROPONINI in the last 168 hours. BNP: BNP (last 3 results) No results for input(s): BNP in the last 8760 hours.  ProBNP (last 3 results) No results for input(s): PROBNP in the last 8760 hours.  CBG: No results for input(s): GLUCAP in the last 168 hours.     SignedLelon Frohlich  [facility] Pager: [phone] 05/02/2015, 1:11 PM

## 2015-05-02 NOTE — Progress Notes (Signed)
Discharge via wheelchair accompanied by staff for discharge home in care of family.  Stable at discharge.

## 2015-05-03 LAB — CEA: CEA: 3.8 ng/mL (ref 0.0–4.7)

## 2015-05-04 LAB — TYPE AND SCREEN
ABO/RH(D): O POS
ANTIBODY SCREEN: NEGATIVE
Unit division: 0
Unit division: 0

## 2015-05-05 ENCOUNTER — Encounter (HOSPITAL_COMMUNITY): Payer: Self-pay | Admitting: Internal Medicine

## 2015-05-06 ENCOUNTER — Telehealth (INDEPENDENT_AMBULATORY_CARE_PROVIDER_SITE_OTHER): Payer: Self-pay | Admitting: Internal Medicine

## 2015-05-06 NOTE — Telephone Encounter (Signed)
Ms. Clinesmith called saying Dr. Laural Golden told her he'd send her pathology reports to the surgeon she's going to see at [facility]. His name is Dr. Trina Ao. She'd like her pathology reports faxed to the surgeon. Please call the pt if needed.  Surgeon's Fax # [phone] Thank you.

## 2015-05-07 ENCOUNTER — Telehealth: Payer: Self-pay | Admitting: General Practice

## 2015-05-07 NOTE — Telephone Encounter (Signed)
Patient called to let us know she is seeing Dr. Laural Golden now and she doesn't need to see [place].

## 2015-05-07 NOTE — Telephone Encounter (Signed)
Reports faxed

## 2015-05-07 NOTE — Telephone Encounter (Signed)
Forwarded to Lelon Frohlich, so that a fax can be sent to patient's surgeron.

## 2015-05-12 ENCOUNTER — Ambulatory Visit: Payer: Medicare Other | Admitting: Nurse Practitioner

## 2015-06-11 ENCOUNTER — Telehealth (INDEPENDENT_AMBULATORY_CARE_PROVIDER_SITE_OTHER): Payer: Self-pay | Admitting: Internal Medicine

## 2015-06-11 NOTE — Telephone Encounter (Signed)
Erin Cooke called saying due to her insurance carrier, Dr. Laural Golden needs to fill out a form saying she was in the ICU for a couple of days after having a colonoscopy due to colon cancer being found. She'll mail the paperwork to the office. She also wants Dr. Laural Golden to know she had her surgery in [place], [place]. According to Erin Cooke, "no lymphnodes were positive and there was no penetration of walls from them either." During the surgery she had a heart attack and was hospitalized for a couple of weeks. She said she returned home last Thursday. Please call the patient if needed.  Pt's ph# [phone] Thank you.

## 2015-06-11 NOTE — Telephone Encounter (Signed)
We need to see actual paperwork before

## 2015-06-11 NOTE — Telephone Encounter (Signed)
I will share with Dr.Rehman. Ann - do you need to see this about the paper work and Dr.Rehman?

## 2016-02-03 ENCOUNTER — Other Ambulatory Visit (INDEPENDENT_AMBULATORY_CARE_PROVIDER_SITE_OTHER): Payer: Self-pay | Admitting: *Deleted

## 2016-02-03 DIAGNOSIS — Z85038 Personal history of other malignant neoplasm of large intestine: Secondary | ICD-10-CM | POA: Insufficient documentation

## 2016-03-29 ENCOUNTER — Encounter (INDEPENDENT_AMBULATORY_CARE_PROVIDER_SITE_OTHER): Payer: Self-pay | Admitting: *Deleted

## 2016-03-29 ENCOUNTER — Telehealth (INDEPENDENT_AMBULATORY_CARE_PROVIDER_SITE_OTHER): Payer: Self-pay | Admitting: *Deleted

## 2016-03-29 NOTE — Telephone Encounter (Signed)
Patient needs trilyte 

## 2016-04-01 MED ORDER — PEG 3350-KCL-NA BICARB-NACL 420 G PO SOLR: 4000.0000 mL | Freq: Once | ORAL | 0 refills | Status: AC

## 2016-04-16 ENCOUNTER — Telehealth (INDEPENDENT_AMBULATORY_CARE_PROVIDER_SITE_OTHER): Payer: Self-pay | Admitting: *Deleted

## 2016-04-16 NOTE — Telephone Encounter (Signed)
Referring MD/PCP: no PCP per patient   Procedure: tcs  Reason/Indication:  Hx colon ca  Has patient had this procedure before?  Yes, 04/2015  If so, when, by whom and where?    Is there a family history of colon cancer?  Yes, uncles  Who?  What age when diagnosed?    Is patient diabetic?   no      Does patient have prosthetic heart valve or mechanical valve?  no  Do you have a pacemaker?  no  Has patient ever had endocarditis? no  Has patient had joint replacement within last 12 months?  no  Does patient tend to be constipated or take laxatives? no  Does patient have a history of alcohol/drug use?  no  Is patient on Coumadin, Plavix and/or Aspirin? yes  Medications: see epic  Allergies: nkda  Medication Adjustment: asa 2 days  Procedure date & time: 05/19/16 at 830

## 2016-04-20 DIAGNOSIS — M48062 Spinal stenosis, lumbar region with neurogenic claudication: Secondary | ICD-10-CM | POA: Diagnosis not present

## 2016-04-20 DIAGNOSIS — I10 Essential (primary) hypertension: Secondary | ICD-10-CM | POA: Diagnosis not present

## 2016-04-20 DIAGNOSIS — W19XXXA Unspecified fall, initial encounter: Secondary | ICD-10-CM | POA: Diagnosis not present

## 2016-04-20 DIAGNOSIS — Z6839 Body mass index (BMI) 39.0-39.9, adult: Secondary | ICD-10-CM | POA: Diagnosis not present

## 2016-04-20 NOTE — Telephone Encounter (Signed)
agree

## 2016-04-23 DIAGNOSIS — M48062 Spinal stenosis, lumbar region with neurogenic claudication: Secondary | ICD-10-CM | POA: Diagnosis not present

## 2016-04-27 DIAGNOSIS — M48062 Spinal stenosis, lumbar region with neurogenic claudication: Secondary | ICD-10-CM | POA: Diagnosis not present

## 2016-04-27 DIAGNOSIS — M545 Low back pain: Secondary | ICD-10-CM | POA: Diagnosis not present

## 2016-04-27 DIAGNOSIS — M7138 Other bursal cyst, other site: Secondary | ICD-10-CM | POA: Diagnosis not present

## 2016-05-10 DIAGNOSIS — M79604 Pain in right leg: Secondary | ICD-10-CM | POA: Diagnosis not present

## 2016-05-10 DIAGNOSIS — R29898 Other symptoms and signs involving the musculoskeletal system: Secondary | ICD-10-CM | POA: Diagnosis not present

## 2016-05-10 DIAGNOSIS — M48062 Spinal stenosis, lumbar region with neurogenic claudication: Secondary | ICD-10-CM | POA: Diagnosis not present

## 2016-05-17 DIAGNOSIS — G8929 Other chronic pain: Secondary | ICD-10-CM | POA: Diagnosis not present

## 2016-05-17 DIAGNOSIS — M7138 Other bursal cyst, other site: Secondary | ICD-10-CM | POA: Diagnosis not present

## 2016-05-17 DIAGNOSIS — I10 Essential (primary) hypertension: Secondary | ICD-10-CM | POA: Diagnosis not present

## 2016-05-17 DIAGNOSIS — M48062 Spinal stenosis, lumbar region with neurogenic claudication: Secondary | ICD-10-CM | POA: Diagnosis not present

## 2016-05-17 DIAGNOSIS — Z6841 Body Mass Index (BMI) 40.0 and over, adult: Secondary | ICD-10-CM | POA: Diagnosis not present

## 2016-05-17 DIAGNOSIS — M25551 Pain in right hip: Secondary | ICD-10-CM | POA: Diagnosis not present

## 2016-05-20 DIAGNOSIS — M25551 Pain in right hip: Secondary | ICD-10-CM | POA: Diagnosis not present

## 2016-05-20 DIAGNOSIS — M1610 Unilateral primary osteoarthritis, unspecified hip: Secondary | ICD-10-CM | POA: Diagnosis not present

## 2016-05-26 DIAGNOSIS — M1610 Unilateral primary osteoarthritis, unspecified hip: Secondary | ICD-10-CM | POA: Diagnosis not present

## 2016-05-26 DIAGNOSIS — M25551 Pain in right hip: Secondary | ICD-10-CM | POA: Diagnosis not present

## 2016-06-01 DIAGNOSIS — I429 Cardiomyopathy, unspecified: Secondary | ICD-10-CM | POA: Diagnosis not present

## 2016-06-01 DIAGNOSIS — M1610 Unilateral primary osteoarthritis, unspecified hip: Secondary | ICD-10-CM | POA: Diagnosis not present

## 2016-06-01 DIAGNOSIS — M25551 Pain in right hip: Secondary | ICD-10-CM | POA: Diagnosis not present

## 2016-06-01 DIAGNOSIS — C182 Malignant neoplasm of ascending colon: Secondary | ICD-10-CM | POA: Diagnosis not present

## 2016-06-01 DIAGNOSIS — I340 Nonrheumatic mitral (valve) insufficiency: Secondary | ICD-10-CM | POA: Diagnosis not present

## 2016-06-01 DIAGNOSIS — E785 Hyperlipidemia, unspecified: Secondary | ICD-10-CM | POA: Diagnosis not present

## 2016-06-01 DIAGNOSIS — I2510 Atherosclerotic heart disease of native coronary artery without angina pectoris: Secondary | ICD-10-CM | POA: Diagnosis not present

## 2016-06-03 DIAGNOSIS — M1610 Unilateral primary osteoarthritis, unspecified hip: Secondary | ICD-10-CM | POA: Diagnosis not present

## 2016-06-03 DIAGNOSIS — M25551 Pain in right hip: Secondary | ICD-10-CM | POA: Diagnosis not present

## 2016-06-04 DIAGNOSIS — M1610 Unilateral primary osteoarthritis, unspecified hip: Secondary | ICD-10-CM | POA: Diagnosis not present

## 2016-06-04 DIAGNOSIS — M25551 Pain in right hip: Secondary | ICD-10-CM | POA: Diagnosis not present

## 2016-06-08 DIAGNOSIS — M25551 Pain in right hip: Secondary | ICD-10-CM | POA: Diagnosis not present

## 2016-06-08 DIAGNOSIS — M1610 Unilateral primary osteoarthritis, unspecified hip: Secondary | ICD-10-CM | POA: Diagnosis not present

## 2016-06-10 DIAGNOSIS — M1610 Unilateral primary osteoarthritis, unspecified hip: Secondary | ICD-10-CM | POA: Diagnosis not present

## 2016-06-10 DIAGNOSIS — M1611 Unilateral primary osteoarthritis, right hip: Secondary | ICD-10-CM | POA: Diagnosis not present

## 2016-06-10 DIAGNOSIS — M25551 Pain in right hip: Secondary | ICD-10-CM | POA: Diagnosis not present

## 2016-06-11 DIAGNOSIS — M25551 Pain in right hip: Secondary | ICD-10-CM | POA: Diagnosis not present

## 2016-06-11 DIAGNOSIS — M1610 Unilateral primary osteoarthritis, unspecified hip: Secondary | ICD-10-CM | POA: Diagnosis not present

## 2016-06-16 DIAGNOSIS — Z885 Allergy status to narcotic agent status: Secondary | ICD-10-CM | POA: Diagnosis not present

## 2016-06-16 DIAGNOSIS — C50919 Malignant neoplasm of unspecified site of unspecified female breast: Secondary | ICD-10-CM | POA: Diagnosis not present

## 2016-06-16 DIAGNOSIS — I340 Nonrheumatic mitral (valve) insufficiency: Secondary | ICD-10-CM | POA: Diagnosis not present

## 2016-06-16 DIAGNOSIS — Z79899 Other long term (current) drug therapy: Secondary | ICD-10-CM | POA: Diagnosis not present

## 2016-06-16 DIAGNOSIS — Z8049 Family history of malignant neoplasm of other genital organs: Secondary | ICD-10-CM | POA: Diagnosis not present

## 2016-06-16 DIAGNOSIS — R635 Abnormal weight gain: Secondary | ICD-10-CM | POA: Diagnosis not present

## 2016-06-16 DIAGNOSIS — Z853 Personal history of malignant neoplasm of breast: Secondary | ICD-10-CM | POA: Diagnosis not present

## 2016-06-16 DIAGNOSIS — Z808 Family history of malignant neoplasm of other organs or systems: Secondary | ICD-10-CM | POA: Diagnosis not present

## 2016-06-16 DIAGNOSIS — Z7982 Long term (current) use of aspirin: Secondary | ICD-10-CM | POA: Diagnosis not present

## 2016-06-16 DIAGNOSIS — E785 Hyperlipidemia, unspecified: Secondary | ICD-10-CM | POA: Diagnosis not present

## 2016-06-16 DIAGNOSIS — Z884 Allergy status to anesthetic agent status: Secondary | ICD-10-CM | POA: Diagnosis not present

## 2016-06-16 DIAGNOSIS — Z803 Family history of malignant neoplasm of breast: Secondary | ICD-10-CM | POA: Diagnosis not present

## 2016-06-16 DIAGNOSIS — C182 Malignant neoplasm of ascending colon: Secondary | ICD-10-CM | POA: Diagnosis not present

## 2016-06-16 DIAGNOSIS — I509 Heart failure, unspecified: Secondary | ICD-10-CM | POA: Diagnosis not present

## 2016-07-01 DIAGNOSIS — C44722 Squamous cell carcinoma of skin of right lower limb, including hip: Secondary | ICD-10-CM | POA: Diagnosis not present

## 2016-07-01 DIAGNOSIS — L308 Other specified dermatitis: Secondary | ICD-10-CM | POA: Diagnosis not present

## 2016-07-05 DIAGNOSIS — M48061 Spinal stenosis, lumbar region without neurogenic claudication: Secondary | ICD-10-CM | POA: Diagnosis not present

## 2016-07-05 DIAGNOSIS — I10 Essential (primary) hypertension: Secondary | ICD-10-CM | POA: Diagnosis not present

## 2016-07-05 DIAGNOSIS — Z6841 Body Mass Index (BMI) 40.0 and over, adult: Secondary | ICD-10-CM | POA: Diagnosis not present

## 2016-07-08 DIAGNOSIS — Z885 Allergy status to narcotic agent status: Secondary | ICD-10-CM | POA: Diagnosis not present

## 2016-07-08 DIAGNOSIS — Z808 Family history of malignant neoplasm of other organs or systems: Secondary | ICD-10-CM | POA: Diagnosis not present

## 2016-07-08 DIAGNOSIS — Z8049 Family history of malignant neoplasm of other genital organs: Secondary | ICD-10-CM | POA: Diagnosis not present

## 2016-07-08 DIAGNOSIS — C182 Malignant neoplasm of ascending colon: Secondary | ICD-10-CM | POA: Diagnosis not present

## 2016-07-08 DIAGNOSIS — Z853 Personal history of malignant neoplasm of breast: Secondary | ICD-10-CM | POA: Diagnosis not present

## 2016-07-08 DIAGNOSIS — Z888 Allergy status to other drugs, medicaments and biological substances status: Secondary | ICD-10-CM | POA: Diagnosis not present

## 2016-07-08 DIAGNOSIS — Z7982 Long term (current) use of aspirin: Secondary | ICD-10-CM | POA: Diagnosis not present

## 2016-07-08 DIAGNOSIS — Z79899 Other long term (current) drug therapy: Secondary | ICD-10-CM | POA: Diagnosis not present

## 2016-07-08 DIAGNOSIS — Z803 Family history of malignant neoplasm of breast: Secondary | ICD-10-CM | POA: Diagnosis not present

## 2016-07-08 DIAGNOSIS — Z8041 Family history of malignant neoplasm of ovary: Secondary | ICD-10-CM | POA: Diagnosis not present

## 2016-07-08 DIAGNOSIS — R635 Abnormal weight gain: Secondary | ICD-10-CM | POA: Diagnosis not present

## 2016-07-09 DIAGNOSIS — R922 Inconclusive mammogram: Secondary | ICD-10-CM | POA: Diagnosis not present

## 2016-07-15 ENCOUNTER — Encounter (INDEPENDENT_AMBULATORY_CARE_PROVIDER_SITE_OTHER): Payer: Self-pay

## 2016-07-28 ENCOUNTER — Ambulatory Visit (HOSPITAL_COMMUNITY)
Admission: RE | Admit: 2016-07-28 | Discharge: 2016-07-28 | Disposition: A | Discharge status: Home / Self Care | Payer: Medicare Other | Source: Ambulatory Visit | Attending: Internal Medicine | Admitting: Internal Medicine

## 2016-07-28 ENCOUNTER — Encounter (HOSPITAL_COMMUNITY): Payer: Self-pay | Admitting: *Deleted

## 2016-07-28 ENCOUNTER — Encounter (HOSPITAL_COMMUNITY)
Admission: RE | Disposition: A | Discharge status: Home / Self Care | Payer: Self-pay | Source: Ambulatory Visit | Attending: Internal Medicine

## 2016-07-28 ENCOUNTER — Telehealth (INDEPENDENT_AMBULATORY_CARE_PROVIDER_SITE_OTHER): Payer: Self-pay | Admitting: *Deleted

## 2016-07-28 DIAGNOSIS — K5730 Diverticulosis of large intestine without perforation or abscess without bleeding: Secondary | ICD-10-CM

## 2016-07-28 DIAGNOSIS — D123 Benign neoplasm of transverse colon: Secondary | ICD-10-CM | POA: Insufficient documentation

## 2016-07-28 DIAGNOSIS — Z86010 Personal history of colonic polyps: Secondary | ICD-10-CM | POA: Insufficient documentation

## 2016-07-28 DIAGNOSIS — I2510 Atherosclerotic heart disease of native coronary artery without angina pectoris: Secondary | ICD-10-CM | POA: Insufficient documentation

## 2016-07-28 DIAGNOSIS — Z79899 Other long term (current) drug therapy: Secondary | ICD-10-CM | POA: Insufficient documentation

## 2016-07-28 DIAGNOSIS — E785 Hyperlipidemia, unspecified: Secondary | ICD-10-CM | POA: Insufficient documentation

## 2016-07-28 DIAGNOSIS — K633 Ulcer of intestine: Secondary | ICD-10-CM | POA: Diagnosis not present

## 2016-07-28 DIAGNOSIS — Z9049 Acquired absence of other specified parts of digestive tract: Secondary | ICD-10-CM | POA: Insufficient documentation

## 2016-07-28 DIAGNOSIS — Z87891 Personal history of nicotine dependence: Secondary | ICD-10-CM | POA: Insufficient documentation

## 2016-07-28 DIAGNOSIS — D509 Iron deficiency anemia, unspecified: Secondary | ICD-10-CM | POA: Insufficient documentation

## 2016-07-28 DIAGNOSIS — Z791 Long term (current) use of non-steroidal anti-inflammatories (NSAID): Secondary | ICD-10-CM | POA: Diagnosis not present

## 2016-07-28 DIAGNOSIS — I252 Old myocardial infarction: Secondary | ICD-10-CM | POA: Diagnosis not present

## 2016-07-28 DIAGNOSIS — K644 Residual hemorrhoidal skin tags: Secondary | ICD-10-CM | POA: Insufficient documentation

## 2016-07-28 DIAGNOSIS — Z1211 Encounter for screening for malignant neoplasm of colon: Secondary | ICD-10-CM | POA: Diagnosis not present

## 2016-07-28 DIAGNOSIS — Z980 Intestinal bypass and anastomosis status: Secondary | ICD-10-CM | POA: Insufficient documentation

## 2016-07-28 DIAGNOSIS — Z96661 Presence of right artificial ankle joint: Secondary | ICD-10-CM | POA: Diagnosis not present

## 2016-07-28 DIAGNOSIS — D125 Benign neoplasm of sigmoid colon: Secondary | ICD-10-CM | POA: Diagnosis not present

## 2016-07-28 DIAGNOSIS — K219 Gastro-esophageal reflux disease without esophagitis: Secondary | ICD-10-CM | POA: Diagnosis not present

## 2016-07-28 DIAGNOSIS — Z7982 Long term (current) use of aspirin: Secondary | ICD-10-CM | POA: Diagnosis not present

## 2016-07-28 DIAGNOSIS — K317 Polyp of stomach and duodenum: Secondary | ICD-10-CM | POA: Diagnosis not present

## 2016-07-28 DIAGNOSIS — Z85038 Personal history of other malignant neoplasm of large intestine: Secondary | ICD-10-CM | POA: Diagnosis not present

## 2016-07-28 DIAGNOSIS — Z955 Presence of coronary angioplasty implant and graft: Secondary | ICD-10-CM | POA: Insufficient documentation

## 2016-07-28 DIAGNOSIS — K228 Other specified diseases of esophagus: Secondary | ICD-10-CM | POA: Diagnosis not present

## 2016-07-28 DIAGNOSIS — I509 Heart failure, unspecified: Secondary | ICD-10-CM | POA: Diagnosis not present

## 2016-07-28 HISTORY — PX: COLONOSCOPY: SHX5424

## 2016-07-28 HISTORY — DX: Heart failure, unspecified: I50.9

## 2016-07-28 HISTORY — DX: Malignant neoplasm of colon, unspecified: C18.9

## 2016-07-28 HISTORY — PX: ESOPHAGOGASTRODUODENOSCOPY: SHX5428

## 2016-07-28 HISTORY — DX: Dyspnea, unspecified: R06.00

## 2016-07-28 SURGERY — COLONOSCOPY
Anesthesia: Moderate Sedation

## 2016-07-28 MED ORDER — MIDAZOLAM HCL 5 MG/5ML IJ SOLN
INTRAMUSCULAR | Status: DC | PRN
Start: 2016-07-28 — End: 2016-07-28
  Administered 2016-07-28: 1 mg via INTRAVENOUS
  Administered 2016-07-28: 2 mg via INTRAVENOUS
  Administered 2016-07-28: 1 mg via INTRAVENOUS
  Administered 2016-07-28: 2 mg via INTRAVENOUS

## 2016-07-28 MED ORDER — MEPERIDINE HCL 50 MG/ML IJ SOLN
INTRAMUSCULAR | Status: DC
Start: 2016-07-28 — End: 2016-07-28
  Filled 2016-07-28: qty 1

## 2016-07-28 MED ORDER — SODIUM CHLORIDE 0.9 % IV SOLN
INTRAVENOUS | Status: DC | PRN
  Administered 2016-07-28: 4 mL via INTRAMUSCULAR

## 2016-07-28 MED ORDER — HYDROCODONE-ACETAMINOPHEN 5-325 MG PO TABS: 1.0000 | ORAL_TABLET | Freq: Three times a day (TID) | ORAL | 0 refills | Status: DC | PRN

## 2016-07-28 MED ORDER — MIDAZOLAM HCL 5 MG/5ML IJ SOLN
INTRAMUSCULAR | Status: AC
  Filled 2016-07-28: qty 10

## 2016-07-28 MED ORDER — MEPERIDINE HCL 50 MG/ML IJ SOLN
INTRAMUSCULAR | Status: DC | PRN
  Administered 2016-07-28 (×2): 25 mg via INTRAVENOUS

## 2016-07-28 MED ORDER — STERILE WATER FOR IRRIGATION IR SOLN
Status: DC | PRN
  Administered 2016-07-28: 2.5 mL

## 2016-07-28 MED ORDER — SODIUM CHLORIDE 0.9 % IV SOLN
INTRAVENOUS | Status: DC
  Administered 2016-07-28: 11:00:00 via INTRAVENOUS

## 2016-07-28 MED ORDER — LIDOCAINE VISCOUS 2 % MT SOLN
OROMUCOSAL | Status: AC
  Filled 2016-07-28: qty 15

## 2016-07-28 NOTE — H&P (Signed)
Erin Cooke is an 76 y.o. female.   Chief Complaint: Patient is here for EGD and colonoscopy. 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 She has undergone surgery and did not require chemotherapy. Sent multiple polyps. She was recent found to have iron deficiency anemia. She is on low-dose aspirin and she also has been taking 2 tablets of Aleve every day for pain. However she has not of sparing and smell melena or rectal bleeding. She denies abdominal pain. She does have history of erosive esophagitis. History significant for colon carcinoma in 3 maternal uncles when her 76s as well as first cousin on her mother's side with surgery when she was in her 45s. Her son had colonoscopy done 5 years ago with removal of multiple polyps.  Past Medical History:  Diagnosis Date  . Arthritis   . Cancer (HCC)    breast  . CHF (congestive heart failure) ([place])   . Chronic back pain    synovial cyst,radiculopathy,and stenosis  . Colon cancer ([place])   . Coronary artery disease   . Dyspnea   . Erosive esophagitis   . GERD (gastroesophageal reflux disease)    takes OTC Prilosec daily  . History of colon polyps   . Hyperlipidemia    takes Atorvastatin daily  . Microscopic colitis   . Myocardial infarction 1989, 2017  . Palpitations    takes betapace daily  . PONV (postoperative nausea and vomiting)    states Forane elevated her liver enzymes  . Stress incontinence   . Thyroid nodule    benign    Past Surgical History:  Procedure Laterality Date  . ANKLE SURGERY Right 1990   pins and plate  . APPENDECTOMY    . BACK SURGERY  2011  . BIOPSY THYROID    . CARDIAC CATHETERIZATION  1989/1999  . CARPAL TUNNEL RELEASE Right 2004  . cataract surgery with corneal transplant  Right 2008  . cataract surgery with corneal transplant  Left 2014  . COLON SURGERY  2017  . COLONOSCOPY    . COLONOSCOPY  N/A 05/01/2015   Procedure: COLONOSCOPY;  Surgeon: Rogene Houston, MD;  Location: [facility];  Service: Endoscopy;  Laterality: N/A;  . CORONARY ANGIOPLASTY     2 stents  . DILATION AND CURETTAGE OF UTERUS  2014  . ESOPHAGOGASTRODUODENOSCOPY    . eye lid surgery Bilateral 2014  . MASTECTOMY, PARTIAL Right 2013  . ROTATOR CUFF REPAIR Left 1989  . ROTATOR CUFF REPAIR Right 2001  . SPINAL FUSION  2012  . TOTAL ANKLE REPLACEMENT Right 2012    Family History  Problem Relation Age of Onset  . Colon cancer Other    Social History:  reports that she has quit smoking. She has a 2.50 pack-year smoking history. She has never used smokeless tobacco. She reports that she does not drink alcohol or use drugs.  Allergies:  Allergies  Allergen Reactions  . Forane [Isoflurane] Other (See Comments)    Elevated liver enzymes    Medications Prior to Admission  Medication Sig Dispense Refill  . aspirin EC 81 MG tablet Take 81 mg by mouth daily.    Marland Kitchen atorvastatin (LIPITOR) 80 MG tablet Take 80 mg by mouth every evening.     . Biotin 10000 MCG TABS Take 10,000 mcg by mouth daily.    . Calcium-Magnesium-Vitamin D (CALCIUM 1200+D3 PO) Take 1 tablet by mouth daily.    Marland Kitchen  diphenhydrAMINE (BENADRYL) 25 MG tablet Take 25 mg by mouth at bedtime as needed for sleep.    . furosemide (LASIX) 20 MG tablet Take 20 mg by mouth every other day.    . losartan (COZAAR) 25 MG tablet Take 25 mg by mouth daily.    . metoprolol (LOPRESSOR) 100 MG tablet Take 100 mg by mouth 2 (two) times daily.    . Multiple Vitamin (MULTIVITAMIN WITH MINERALS) TABS tablet Take 1 tablet by mouth daily.    . naproxen sodium (ANAPROX) 220 MG tablet Take 440 mg by mouth daily.    . nitroGLYCERIN (NITROSTAT) 0.4 MG SL tablet Place 0.4 mg under the tongue every 5 (five) minutes as needed for chest pain.    Marland Kitchen omeprazole (PRILOSEC OTC) 20 MG tablet Take 20 mg by mouth every morning.    Vladimir Faster Glycol-Propyl Glycol (SYSTANE OP) Apply 1  drop to eye 3 (three) times daily.    . prednisoLONE acetate (PRED FORTE) 1 % ophthalmic suspension Place 1 drop into both eyes daily.    . Probiotic Product (PHILLIPS COLON HEALTH PO) Take 1 capsule by mouth daily.    . Loperamide HCl (IMODIUM PO) Take 1 tablet by mouth daily as needed (for diarrhea).    . TURMERIC PO Take 1 tablet by mouth daily.      No results found for this or any previous visit (from the past 48 hour(s)). No results found.  ROS  Blood pressure (!) 171/68, pulse 84, temperature 98.4 F (36.9 C), temperature source Oral, resp. rate 16, height 5\' 1"  (1.549 m), weight 200 lb (90.7 kg), SpO2 100 %. Physical Exam  Constitutional: She appears well-developed and well-nourished.  HENT:  Mouth/Throat: Oropharynx is clear and moist.  Eyes: Conjunctivae are normal. No scleral icterus.  Neck: No thyromegaly present.  Cardiovascular: Normal rate, regular rhythm and normal heart sounds.   No murmur heard. Respiratory: Effort normal and breath sounds normal.  GI:  Abdomen is full. Small midline scar noted. Abdomen is soft and nontender without organomegaly or masses.  Musculoskeletal: She exhibits no edema.  Lymphadenopathy:    She has no cervical adenopathy.  Neurological: She is alert.  Skin: Skin is warm and dry.     Assessment/Plan Iron deficiency anemia. NSAID use and history of colon carcinoma and polyps. Diagnostic EGD and colonoscopy.  Hildred Laser, MD 07/28/2016, 12:20 PM

## 2016-07-28 NOTE — Discharge Instructions (Signed)
No aspirin on 1 week. Discontinue Naprosyn or Aleve. Hydrocodone/acetaminophen 5/325 one tablet by mouth 3 times a day as needed for joint pain. Resume other medications. High fiber diet. No driving for 24 hours. Physician will call with biopsy results.      Colonoscopy, Adult, Care After This sheet gives you information about how to care for yourself after your procedure. Your doctor may also give you more specific instructions. If you have problems or questions, call your doctor. Follow these instructions at home: General instructions    For the first 24 hours after the procedure:  Do not drive or use machinery.  Do not sign important documents.  Do not drink alcohol.  Do your daily activities more slowly than normal.  Eat foods that are soft and easy to digest.  Rest often.  Take over-the-counter or prescription medicines only as told by your doctor.  It is up to you to get the results of your procedure. Ask your doctor, or the department performing the procedure, when your results will be ready. To help cramping and bloating:   Try walking around.  Put heat on your belly (abdomen) as told by your doctor. Use a heat source that your doctor recommends, such as a moist heat pack or a heating pad.  Put a towel between your skin and the heat source.  Leave the heat on for 20-30 minutes.  Remove the heat if your skin turns bright red. This is especially important if you cannot feel pain, heat, or cold. You can get burned. Eating and drinking   Drink enough fluid to keep your pee (urine) clear or pale yellow.  Return to your normal diet as told by your doctor. Avoid heavy or fried foods that are hard to digest.  Avoid drinking alcohol for as long as told by your doctor. Contact a doctor if:  You have blood in your poop (stool) 2-3 days after the procedure. Get help right away if:  You have more than a small amount of blood in your poop.  You see large clumps  of tissue (blood clots) in your poop.  Your belly is swollen.  You feel sick to your stomach (nauseous).  You throw up (vomit).  You have a fever.  You have belly pain that gets worse, and medicine does not help your pain.      High-Fiber Diet Fiber, also called dietary fiber, is a type of carbohydrate found in fruits, vegetables, whole grains, and beans. A high-fiber diet can have many health benefits. Your health care provider may recommend a high-fiber diet to help: Prevent constipation. Fiber can make your bowel movements more regular. Lower your cholesterol. Relieve hemorrhoids, uncomplicated diverticulosis, or irritable bowel syndrome. Prevent overeating as part of a weight-loss plan. Prevent heart disease, type 2 diabetes, and certain cancers. What is my plan? The recommended daily intake of fiber includes: 38 grams for men under age 20. 59 grams for men over age 53. 61 grams for women under age 85. 29 grams for women over age 83. You can get the recommended daily intake of dietary fiber by eating a variety of fruits, vegetables, grains, and beans. Your health care provider may also recommend a fiber supplement if it is not possible to get enough fiber through your diet. What do I need to know about a high-fiber diet? Fiber supplements have not been widely studied for their effectiveness, so it is better to get fiber through food sources. Always check the fiber content on  thenutrition facts label of any prepackaged food. Look for foods that contain at least 5 grams of fiber per serving. Ask your dietitian if you have questions about specific foods that are related to your condition, especially if those foods are not listed in the following section. Increase your daily fiber consumption gradually. Increasing your intake of dietary fiber too quickly may cause bloating, cramping, or gas. Drink plenty of water. Water helps you to digest fiber. What foods can I eat? Grains    Whole-grain breads. Multigrain cereal. Oats and oatmeal. Brown rice. Barley. Bulgur wheat. Kahlotus. Bran muffins. Popcorn. Rye wafer crackers. Vegetables  Sweet potatoes. Spinach. Kale. Artichokes. Cabbage. Broccoli. Green peas. Carrots. Squash. Fruits  Berries. Pears. Apples. Oranges. Avocados. Prunes and raisins. Dried figs. Meats and Other Protein Sources  Navy, kidney, pinto, and soy beans. Split peas. Lentils. Nuts and seeds. Dairy  Fiber-fortified yogurt. Beverages  Fiber-fortified soy milk. Fiber-fortified orange juice. Other  Fiber bars. The items listed above may not be a complete list of recommended foods or beverages. Contact your dietitian for more options.  What foods are not recommended? Grains  White bread. Pasta made with refined flour. White rice. Vegetables  Fried potatoes. Canned vegetables. Well-cooked vegetables. Fruits  Fruit juice. Cooked, strained fruit. Meats and Other Protein Sources  Fatty cuts of meat. Fried Sales executive or fried fish. Dairy  Milk. Yogurt. Cream cheese. Sour cream. Beverages  Soft drinks. Other  Cakes and pastries. Butter and oils. The items listed above may not be a complete list of foods and beverages to avoid. Contact your dietitian for more information.  What are some tips for including high-fiber foods in my diet? Eat a wide variety of high-fiber foods. Make sure that half of all grains consumed each day are whole grains. Replace breads and cereals made from refined flour or white flour with whole-grain breads and cereals. Replace white rice with brown rice, bulgur wheat, or millet. Start the day with a breakfast that is high in fiber, such as a cereal that contains at least 5 grams of fiber per serving. Use beans in place of meat in soups, salads, or pasta. Eat high-fiber snacks, such as berries, raw vegetables, nuts, or popcorn. This information is not intended to replace advice given to you by your health care provider. Make sure  you discuss any questions you have with your health care provider. Document Released: 04/05/2005 Document Revised: 09/11/2015 Document Reviewed: 09/18/2013 Elsevier Interactive Patient Education  2017 [employer].

## 2016-07-28 NOTE — Op Note (Signed)
[facility] Patient Name: Erin Cooke Procedure Date: 07/28/2016 12:03 PM MRN: [id number] Date of Birth: 12/15/1940 Attending MD: Hildred Laser , MD CSN: [id number] Age: 76 Admit Type: Outpatient Procedure:                Colonoscopy Indications:              High risk colon cancer surveillance: Personal                            history of colon cancer Providers:                Hildred Laser, MD, Charlyne Petrin RN, RN, Zoila Shutter, Technologist Referring MD:             Laurena Slimmer, MD Medicines:                Midazolam 2 mg IV Complications:            No immediate complications. Estimated Blood Loss:     Estimated blood loss was minimal. Procedure:                Pre-Anesthesia Assessment:                           - Prior to the procedure, a History and Physical                            was performed, and patient medications and                            allergies were reviewed. The patient's tolerance of                            previous anesthesia was also reviewed. The risks                            and benefits of the procedure and the sedation                            options and risks were discussed with the patient.                            All questions were answered, and informed consent                            was obtained. Prior Anticoagulants: The patient                            last took aspirin 3 days and naproxen 3 days prior                            to the procedure. ASA Grade Assessment: II - A  patient with mild systemic disease. After reviewing                            the risks and benefits, the patient was deemed in                            satisfactory condition to undergo the procedure.                           After obtaining informed consent, the colonoscope                            was passed under direct vision. Throughout the   procedure, the patient's blood pressure, pulse, and                            oxygen saturations were monitored continuously. The                            [id number] ([id number]) was introduced through the                            anus and advanced to the the ileocolonic                            anastomosis. The colonoscopy was performed without                            difficulty. The patient tolerated the procedure                            well. The quality of the bowel preparation was                            good. The rectum & ileocolonic anastomosis were                            photographed. Scope In: 12:40:09 PM Scope Out: 1:22:58 PM Scope Withdrawal Time: 0 hours 37 minutes 17 seconds  Total Procedure Duration: 0 hours 42 minutes 49 seconds  Findings:      The perianal and digital rectal examinations were normal.      There was evidence of a prior end-to-end ileo-colonic anastomosis at the       hepatic flexure. This was patent and was characterized by ulceration.       This was biopsied with a cold forceps for histology. The pathology       specimen was placed into Bottle Number 1.      A 8 to 12 mm polyp was found in the splenic flexure. The polyp was       semi-sessile. Area was successfully injected with 5 mL saline for lesion       assessment, and this injection appeared to lift the lesion adequately.       The polyp was removed with a hot snare. Resection and retrieval were       complete. To prevent bleeding after  the polypectomy, three hemostatic       clips were successfully placed (MR conditional). There was no bleeding       at the end of the procedure.      Two sessile polyps were found in the sigmoid colon. The polyps were       small in size. These polyps were removed with a cold snare. Resection       and retrieval were complete. The pathology specimen was placed into       Bottle Number 3.      A few small-mouthed diverticula were found in the sigmoid  colon.      External hemorrhoids were found during retroflexion. The hemorrhoids       were small. Impression:               - Patent end-to-end ileo-colonic anastomosis,                            characterized by ulceration. Biopsied.                           - One 8 to 12 mm polyp at the splenic flexure,                            removed with a hot snare. Resected and retrieved.                            Injected. Clips (MR conditional) were placed.                           - Two small polyps in the sigmoid colon, removed                            with a cold snare. Resected and retrieved.                           - Diverticulosis in the sigmoid colon.                           - External hemorrhoids. Moderate Sedation:      Moderate (conscious) sedation was administered by the endoscopy nurse       and supervised by the endoscopist. The following parameters were       monitored: oxygen saturation, heart rate, blood pressure, CO2       capnography and response to care. Total physician intraservice time was       43 minutes. Recommendation:           - Patient has a contact number available for                            emergencies. The signs and symptoms of potential                            delayed complications were discussed with the                            patient. Return to  normal activities tomorrow.                            Written discharge instructions were provided to the                            patient.                           - High fiber diet today.                           - Continue present medications.                           - No aspirin, ibuprofen, naproxen, or other                            non-steroidal anti-inflammatory drugs for 1 week                            after polyp removal.                           - Await pathology results.                           - Repeat colonoscopy in 1 year for surveillance. Procedure Code(s):        ---  Professional ---                           [phone], Colonoscopy, flexible; with removal of                            tumor(s), polyp(s), or other lesion(s) by snare                            technique                           45380, 59, Colonoscopy, flexible; with biopsy,                            single or multiple                           45381, Colonoscopy, flexible; with directed                            submucosal injection(s), any substance                           62836, Moderate sedation services provided by the                            same physician or other qualified health care  professional performing the diagnostic or                            therapeutic service that the sedation supports,                            requiring the presence of an independent trained                            observer to assist in the monitoring of the                            patient's level of consciousness and physiological                            status; initial 15 minutes of intraservice time,                            patient age 76 years or older                           [phone], Moderate sedation services; each additional                            15 minutes intraservice time                           [id number], Moderate sedation services; each additional                            15 minutes intraservice time Diagnosis Code(s):        --- Professional ---                           [phone], Personal history of other malignant                            neoplasm of large intestine                           Z98.0, Intestinal bypass and anastomosis status                           D12.3, Benign neoplasm of transverse colon (hepatic                            flexure or splenic flexure)                           D12.5, Benign neoplasm of sigmoid colon                           K64.4, Residual hemorrhoidal skin tags                           K57.30,  Diverticulosis of large intestine without  perforation or abscess without bleeding CPT copyright 2016 American Medical Association. All rights reserved. The codes documented in this report are preliminary and upon coder review may  be revised to meet current compliance requirements. Hildred Laser, MD Hildred Laser, MD 07/28/2016 1:49:07 PM This report has been signed electronically. Number of Addenda: 0

## 2016-07-28 NOTE — Telephone Encounter (Signed)
   Diagnosis:    Result(s)   Card 1:  Negative:           Completed by: Amiracle Neises,LPN   HEMOCCULT SENSA DEVELOPER: LKH#:[id number]   EXPIRATION DATE: 2020/05   HEMOCCULT SENSA CARD:  YZJ#:[id number]   EXPIRATION DATE: 05/20   CARD CONTROL RESULTS:  POSITIVE: Positive  NEGATIVE: Negative    ADDITIONAL COMMENTS: Patient was called and given her results.

## 2016-07-28 NOTE — Op Note (Signed)
[facility] Patient Name: Erin Cooke Procedure Date: 07/28/2016 12:08 PM MRN: [id number] Date of Birth: 1940/11/22 Attending MD: Hildred Laser , MD CSN: [id number] Age: 76 Admit Type: Outpatient Procedure:                Upper GI endoscopy Indications:              Iron deficiency anemia, chronic NSID use Providers:                Hildred Laser, MD, Charlyne Petrin RN, RN, Zoila Shutter, Technologist Referring MD:             Laurena Slimmer, MD Medicines:                Lidocaine spray, Meperidine 50 mg IV, Midazolam 4                            mg IV Complications:            No immediate complications. Estimated Blood Loss:     Estimated blood loss: none. Procedure:                Pre-Anesthesia Assessment:                           - Prior to the procedure, a History and Physical                            was performed, and patient medications and                            allergies were reviewed. The patient's tolerance of                            previous anesthesia was also reviewed. The risks                            and benefits of the procedure and the sedation                            options and risks were discussed with the patient.                            All questions were answered, and informed consent                            was obtained. Prior Anticoagulants: The patient                            last took aspirin 3 days and naproxen 3 days prior                            to the procedure. ASA Grade Assessment: II - A  patient with mild systemic disease. After reviewing                            the risks and benefits, the patient was deemed in                            satisfactory condition to undergo the procedure.                           After obtaining informed consent, the endoscope was                            passed under direct vision. Throughout the   procedure, the patient's blood pressure, pulse, and                            oxygen saturations were monitored continuously. The                            [id number]) scope was introduced through the                            mouth, and advanced to the second part of duodenum.                            The upper GI endoscopy was accomplished without                            difficulty. The patient tolerated the procedure                            well. Scope In: 12:30:35 PM Scope Out: 12:35:57 PM Total Procedure Duration: 0 hours 5 minutes 22 seconds  Findings:      The examined esophagus was normal.      The Z-line was irregular and was found 37 cm from the incisors.      A few 2 to 4 mm sessile polyps were found in the gastric fundus and in       the gastric body.      The exam of the stomach was otherwise normal.      The duodenal bulb and second portion of the duodenum were normal. Impression:               - Normal esophagus.                           - Z-line irregular, 37 cm from the incisors.                           - A few gastric polyps. These polyps appeared to be                            hypand were left alone.                           -  Normal duodenal bulb and second portion of the                            duodenum.                           - No specimens collected. Moderate Sedation:      Moderate (conscious) sedation was administered by the endoscopy nurse       and supervised by the endoscopist. The following parameters were       monitored: oxygen saturation, heart rate, blood pressure, CO2       capnography and response to care. Total physician intraservice time was       9 minutes. Recommendation:           - Patient has a contact number available for                            emergencies. The signs and symptoms of potential                            delayed complications were discussed with the                            patient. Return  to normal activities tomorrow.                            Written discharge instructions were provided to the                            patient.                           - High fiber diet today.                           - Continue present medications.                           - See the other procedure note for documentation of                            additional recommendations. Procedure Code(s):        --- Professional ---                           [phone], Esophagogastroduodenoscopy, flexible,                            transoral; diagnostic, including collection of                            specimen(s) by brushing or washing, when performed                            (separate procedure) Diagnosis Code(s):        --- Professional ---  K22.8, Other specified diseases of esophagus                           K31.7, Polyp of stomach and duodenum                           D50.9, Iron deficiency anemia, unspecified CPT copyright 2016 American Medical Association. All rights reserved. The codes documented in this report are preliminary and upon coder review may  be revised to meet current compliance requirements. Hildred Laser, MD Hildred Laser, MD 07/28/2016 1:41:41 PM This report has been signed electronically. Number of Addenda: 0

## 2016-07-29 ENCOUNTER — Encounter (INDEPENDENT_AMBULATORY_CARE_PROVIDER_SITE_OTHER): Payer: Self-pay | Admitting: *Deleted

## 2016-07-29 ENCOUNTER — Other Ambulatory Visit (INDEPENDENT_AMBULATORY_CARE_PROVIDER_SITE_OTHER): Payer: Self-pay | Admitting: *Deleted

## 2016-07-29 NOTE — Telephone Encounter (Signed)
Stool heme-negative 

## 2016-07-30 ENCOUNTER — Encounter (HOSPITAL_COMMUNITY): Payer: Self-pay | Admitting: Internal Medicine

## 2016-08-06 DIAGNOSIS — M1611 Unilateral primary osteoarthritis, right hip: Secondary | ICD-10-CM | POA: Diagnosis not present

## 2016-08-06 DIAGNOSIS — M5137 Other intervertebral disc degeneration, lumbosacral region: Secondary | ICD-10-CM | POA: Diagnosis not present

## 2016-08-16 DIAGNOSIS — Z7982 Long term (current) use of aspirin: Secondary | ICD-10-CM | POA: Diagnosis not present

## 2016-08-16 DIAGNOSIS — C182 Malignant neoplasm of ascending colon: Secondary | ICD-10-CM | POA: Diagnosis not present

## 2016-08-16 DIAGNOSIS — Z8049 Family history of malignant neoplasm of other genital organs: Secondary | ICD-10-CM | POA: Diagnosis not present

## 2016-08-16 DIAGNOSIS — Z803 Family history of malignant neoplasm of breast: Secondary | ICD-10-CM | POA: Diagnosis not present

## 2016-08-16 DIAGNOSIS — E785 Hyperlipidemia, unspecified: Secondary | ICD-10-CM | POA: Diagnosis not present

## 2016-08-16 DIAGNOSIS — Z8041 Family history of malignant neoplasm of ovary: Secondary | ICD-10-CM | POA: Diagnosis not present

## 2016-08-16 DIAGNOSIS — Z79899 Other long term (current) drug therapy: Secondary | ICD-10-CM | POA: Diagnosis not present

## 2016-08-16 DIAGNOSIS — Z884 Allergy status to anesthetic agent status: Secondary | ICD-10-CM | POA: Diagnosis not present

## 2016-08-16 DIAGNOSIS — I509 Heart failure, unspecified: Secondary | ICD-10-CM | POA: Diagnosis not present

## 2016-08-16 DIAGNOSIS — Z853 Personal history of malignant neoplasm of breast: Secondary | ICD-10-CM | POA: Diagnosis not present

## 2016-08-16 DIAGNOSIS — Z885 Allergy status to narcotic agent status: Secondary | ICD-10-CM | POA: Diagnosis not present

## 2016-08-18 DIAGNOSIS — C182 Malignant neoplasm of ascending colon: Secondary | ICD-10-CM | POA: Diagnosis not present

## 2016-08-25 DIAGNOSIS — R110 Nausea: Secondary | ICD-10-CM | POA: Diagnosis not present

## 2016-08-25 DIAGNOSIS — C182 Malignant neoplasm of ascending colon: Secondary | ICD-10-CM | POA: Diagnosis not present

## 2016-08-25 DIAGNOSIS — D649 Anemia, unspecified: Secondary | ICD-10-CM | POA: Diagnosis not present

## 2016-08-25 DIAGNOSIS — C50919 Malignant neoplasm of unspecified site of unspecified female breast: Secondary | ICD-10-CM | POA: Diagnosis not present

## 2016-08-26 DIAGNOSIS — B078 Other viral warts: Secondary | ICD-10-CM | POA: Diagnosis not present

## 2016-08-26 DIAGNOSIS — L570 Actinic keratosis: Secondary | ICD-10-CM | POA: Diagnosis not present

## 2016-08-26 DIAGNOSIS — Z299 Encounter for prophylactic measures, unspecified: Secondary | ICD-10-CM | POA: Diagnosis not present

## 2016-08-26 DIAGNOSIS — X32XXXD Exposure to sunlight, subsequent encounter: Secondary | ICD-10-CM | POA: Diagnosis not present

## 2016-08-26 DIAGNOSIS — L820 Inflamed seborrheic keratosis: Secondary | ICD-10-CM | POA: Diagnosis not present

## 2016-08-26 DIAGNOSIS — Z85828 Personal history of other malignant neoplasm of skin: Secondary | ICD-10-CM | POA: Diagnosis not present

## 2016-08-26 DIAGNOSIS — Z01818 Encounter for other preprocedural examination: Secondary | ICD-10-CM | POA: Diagnosis not present

## 2016-08-26 DIAGNOSIS — Z08 Encounter for follow-up examination after completed treatment for malignant neoplasm: Secondary | ICD-10-CM | POA: Diagnosis not present

## 2016-09-03 DIAGNOSIS — M1611 Unilateral primary osteoarthritis, right hip: Secondary | ICD-10-CM | POA: Diagnosis not present

## 2016-09-03 DIAGNOSIS — M5137 Other intervertebral disc degeneration, lumbosacral region: Secondary | ICD-10-CM | POA: Diagnosis not present

## 2016-09-06 DIAGNOSIS — M1611 Unilateral primary osteoarthritis, right hip: Secondary | ICD-10-CM | POA: Diagnosis not present

## 2016-09-06 DIAGNOSIS — M5137 Other intervertebral disc degeneration, lumbosacral region: Secondary | ICD-10-CM | POA: Diagnosis not present

## 2016-09-08 DIAGNOSIS — Z885 Allergy status to narcotic agent status: Secondary | ICD-10-CM | POA: Diagnosis not present

## 2016-09-08 DIAGNOSIS — Z803 Family history of malignant neoplasm of breast: Secondary | ICD-10-CM | POA: Diagnosis not present

## 2016-09-08 DIAGNOSIS — Z808 Family history of malignant neoplasm of other organs or systems: Secondary | ICD-10-CM | POA: Diagnosis not present

## 2016-09-08 DIAGNOSIS — Z8049 Family history of malignant neoplasm of other genital organs: Secondary | ICD-10-CM | POA: Diagnosis not present

## 2016-09-08 DIAGNOSIS — C182 Malignant neoplasm of ascending colon: Secondary | ICD-10-CM | POA: Diagnosis not present

## 2016-09-08 DIAGNOSIS — Z888 Allergy status to other drugs, medicaments and biological substances status: Secondary | ICD-10-CM | POA: Diagnosis not present

## 2016-09-08 DIAGNOSIS — D509 Iron deficiency anemia, unspecified: Secondary | ICD-10-CM | POA: Diagnosis not present

## 2016-09-08 DIAGNOSIS — Z79899 Other long term (current) drug therapy: Secondary | ICD-10-CM | POA: Diagnosis not present

## 2016-09-08 DIAGNOSIS — Z7982 Long term (current) use of aspirin: Secondary | ICD-10-CM | POA: Diagnosis not present

## 2016-09-08 DIAGNOSIS — Z853 Personal history of malignant neoplasm of breast: Secondary | ICD-10-CM | POA: Diagnosis not present

## 2016-09-08 DIAGNOSIS — R635 Abnormal weight gain: Secondary | ICD-10-CM | POA: Diagnosis not present

## 2016-09-08 DIAGNOSIS — I509 Heart failure, unspecified: Secondary | ICD-10-CM | POA: Diagnosis not present

## 2016-09-08 DIAGNOSIS — E785 Hyperlipidemia, unspecified: Secondary | ICD-10-CM | POA: Diagnosis not present

## 2016-09-15 DIAGNOSIS — C182 Malignant neoplasm of ascending colon: Secondary | ICD-10-CM | POA: Diagnosis not present

## 2016-09-15 DIAGNOSIS — Z79899 Other long term (current) drug therapy: Secondary | ICD-10-CM | POA: Diagnosis not present

## 2016-09-15 DIAGNOSIS — D509 Iron deficiency anemia, unspecified: Secondary | ICD-10-CM | POA: Diagnosis not present

## 2016-09-15 DIAGNOSIS — I509 Heart failure, unspecified: Secondary | ICD-10-CM | POA: Diagnosis not present

## 2016-09-15 DIAGNOSIS — E785 Hyperlipidemia, unspecified: Secondary | ICD-10-CM | POA: Diagnosis not present

## 2016-09-15 DIAGNOSIS — R635 Abnormal weight gain: Secondary | ICD-10-CM | POA: Diagnosis not present

## 2016-10-11 DIAGNOSIS — M48061 Spinal stenosis, lumbar region without neurogenic claudication: Secondary | ICD-10-CM | POA: Diagnosis not present

## 2016-10-11 DIAGNOSIS — Z6839 Body mass index (BMI) 39.0-39.9, adult: Secondary | ICD-10-CM | POA: Diagnosis not present

## 2016-10-11 DIAGNOSIS — I10 Essential (primary) hypertension: Secondary | ICD-10-CM | POA: Diagnosis not present

## 2016-10-18 DIAGNOSIS — Z77128 Contact with and (suspected) exposure to other hazards in the physical environment: Secondary | ICD-10-CM | POA: Diagnosis not present

## 2016-10-18 DIAGNOSIS — Z9189 Other specified personal risk factors, not elsewhere classified: Secondary | ICD-10-CM | POA: Diagnosis not present

## 2016-11-01 ENCOUNTER — Encounter (HOSPITAL_COMMUNITY): Payer: Self-pay | Admitting: *Deleted

## 2016-11-01 ENCOUNTER — Emergency Department (HOSPITAL_COMMUNITY)
Admission: EM | Admit: 2016-11-01 | Discharge: 2016-11-01 | Disposition: A | Discharge status: Home / Self Care | Payer: Medicare Other | Attending: Emergency Medicine | Admitting: Emergency Medicine

## 2016-11-01 DIAGNOSIS — M48061 Spinal stenosis, lumbar region without neurogenic claudication: Secondary | ICD-10-CM | POA: Diagnosis not present

## 2016-11-01 DIAGNOSIS — Y929 Unspecified place or not applicable: Secondary | ICD-10-CM | POA: Insufficient documentation

## 2016-11-01 DIAGNOSIS — Z87891 Personal history of nicotine dependence: Secondary | ICD-10-CM | POA: Diagnosis not present

## 2016-11-01 DIAGNOSIS — T148XXA Other injury of unspecified body region, initial encounter: Secondary | ICD-10-CM

## 2016-11-01 DIAGNOSIS — Y9389 Activity, other specified: Secondary | ICD-10-CM | POA: Insufficient documentation

## 2016-11-01 DIAGNOSIS — S0990XA Unspecified injury of head, initial encounter: Secondary | ICD-10-CM

## 2016-11-01 DIAGNOSIS — S80212A Abrasion, left knee, initial encounter: Secondary | ICD-10-CM | POA: Insufficient documentation

## 2016-11-01 DIAGNOSIS — Z79899 Other long term (current) drug therapy: Secondary | ICD-10-CM | POA: Insufficient documentation

## 2016-11-01 DIAGNOSIS — W01198A Fall on same level from slipping, tripping and stumbling with subsequent striking against other object, initial encounter: Secondary | ICD-10-CM | POA: Insufficient documentation

## 2016-11-01 DIAGNOSIS — S0993XA Unspecified injury of face, initial encounter: Secondary | ICD-10-CM | POA: Diagnosis present

## 2016-11-01 DIAGNOSIS — S80211A Abrasion, right knee, initial encounter: Secondary | ICD-10-CM | POA: Insufficient documentation

## 2016-11-01 DIAGNOSIS — G4489 Other headache syndrome: Secondary | ICD-10-CM | POA: Diagnosis not present

## 2016-11-01 DIAGNOSIS — I2510 Atherosclerotic heart disease of native coronary artery without angina pectoris: Secondary | ICD-10-CM | POA: Insufficient documentation

## 2016-11-01 DIAGNOSIS — I509 Heart failure, unspecified: Secondary | ICD-10-CM | POA: Insufficient documentation

## 2016-11-01 DIAGNOSIS — W19XXXA Unspecified fall, initial encounter: Secondary | ICD-10-CM

## 2016-11-01 DIAGNOSIS — Y999 Unspecified external cause status: Secondary | ICD-10-CM | POA: Insufficient documentation

## 2016-11-01 DIAGNOSIS — S0081XA Abrasion of other part of head, initial encounter: Secondary | ICD-10-CM | POA: Insufficient documentation

## 2016-11-01 DIAGNOSIS — S0001XA Abrasion of scalp, initial encounter: Secondary | ICD-10-CM | POA: Diagnosis not present

## 2016-11-01 MED ORDER — TETANUS-DIPHTH-ACELL PERTUSSIS 5-2.5-18.5 LF-MCG/0.5 IM SUSP
0.5000 mL | Freq: Once | INTRAMUSCULAR | Status: AC
  Administered 2016-11-01: 0.5 mL via INTRAMUSCULAR
  Filled 2016-11-01: qty 0.5

## 2016-11-01 NOTE — ED Provider Notes (Signed)
[facility] DEPT Provider Note   CSN: [id number] Arrival date & time: 11/01/16  1452     History   Chief Complaint Chief Complaint  Patient presents with  . Fall    HPI Erin Cooke is a 76 y.o. female not on any blood thinners he comes in today after fall. Patient states she was getting an epidural for back pain and was walking back to her car when she had a mechanical fall, falling onto her face. Denies loss of consciousness, altered mental status, nausea, vomiting since the fall. Stated she sustained a laceration to the nose and left forehead. Now hemostatic. No chest pain, shortness of breath.  HPI  Past Medical History:  Diagnosis Date  . Arthritis   . Cancer (HCC)    breast  . CHF (congestive heart failure) ([place])   . Chronic back pain    synovial cyst,radiculopathy,and stenosis  . Colon cancer ([place])   . Coronary artery disease   . Dyspnea   . Erosive esophagitis   . GERD (gastroesophageal reflux disease)    takes OTC Prilosec daily  . History of colon polyps   . Hyperlipidemia    takes Atorvastatin daily  . Microscopic colitis   . Myocardial infarction ([place]) 1989, 2017  . Palpitations    takes betapace daily  . PONV (postoperative nausea and vomiting)    states Forane elevated her liver enzymes  . Stress incontinence   . Thyroid nodule    benign    Patient Active Problem List   Diagnosis Date Noted  . History of colon cancer 02/03/2016  . Rectal bleeding 04/30/2015  . CAD (coronary artery disease) 04/30/2015  . Acute blood loss anemia 04/30/2015  . Rectal bleed 04/30/2015  . Synovial cyst of lumbar facet joint 10/10/2013    Past Surgical History:  Procedure Laterality Date  . ANKLE SURGERY Right 1990   pins and plate  . APPENDECTOMY    . BACK SURGERY  2011  . BIOPSY THYROID    . CARDIAC CATHETERIZATION  1989/1999  . CARPAL TUNNEL RELEASE Right 2004  . cataract surgery with corneal transplant  Right 2008  . cataract surgery with corneal  transplant  Left 2014  . COLON SURGERY  2017  . COLONOSCOPY    . COLONOSCOPY N/A 05/01/2015   Procedure: COLONOSCOPY;  Surgeon: Rogene Houston, MD;  Location[facility];  Service: Endoscopy;  Laterality: N/A;  . COLONOSCOPY N/A 07/28/2016   Procedure: COLONOSCOPY;  Surgeon: Rogene Houston, MD;  Location: [facility];  Service: Endoscopy;  Laterality: N/A;  830 - moved to 4/11 @ 12:00  . CORONARY ANGIOPLASTY     2 stents  . DILATION AND CURETTAGE OF UTERUS  2014  . ESOPHAGOGASTRODUODENOSCOPY    . ESOPHAGOGASTRODUODENOSCOPY N/A 07/28/2016   Procedure: ESOPHAGOGASTRODUODENOSCOPY (EGD);  Surgeon: Rogene Houston, MD;  Location: [facility];  Service: Endoscopy;  Laterality: N/A;  . eye lid surgery Bilateral 2014  . MASTECTOMY, PARTIAL Right 2013  . ROTATOR CUFF REPAIR Left 1989  . ROTATOR CUFF REPAIR Right 2001  . SPINAL FUSION  2012  . TOTAL ANKLE REPLACEMENT Right 2012    OB History    No data available       Home Medications    Prior to Admission medications   Medication Sig Start Date End Date Taking? Authorizing Provider  aspirin EC 81 MG tablet Take 1 tablet (81 mg total) by mouth daily. 08/04/16   Rogene Houston, MD  atorvastatin (LIPITOR) 80 MG tablet Take 80 mg by mouth every evening.     [provider]  Biotin 10000 MCG TABS Take 10,000 mcg by mouth daily.    [provider]  Calcium-Magnesium-Vitamin D (CALCIUM 1200+D3 PO) Take 1 tablet by mouth daily.    [provider]  diphenhydrAMINE (BENADRYL) 25 MG tablet Take 25 mg by mouth at bedtime as needed for sleep.    [provider]  furosemide (LASIX) 20 MG tablet Take 20 mg by mouth every other day.    [provider]  HYDROcodone-acetaminophen (NORCO/VICODIN) 5-325 MG tablet Take 1 tablet by mouth 3 (three) times daily as needed for moderate pain. 07/28/16   Rogene Houston, MD  Loperamide HCl (IMODIUM PO) Take 1 tablet by mouth daily as needed (for diarrhea).     [provider]  losartan (COZAAR) 25 MG tablet Take 25 mg by mouth daily.    [provider]  metoprolol (LOPRESSOR) 100 MG tablet Take 100 mg by mouth 2 (two) times daily.    [provider]  Multiple Vitamin (MULTIVITAMIN WITH MINERALS) TABS tablet Take 1 tablet by mouth daily.    [provider]  nitroGLYCERIN (NITROSTAT) 0.4 MG SL tablet Place 0.4 mg under the tongue every 5 (five) minutes as needed for chest pain.    [provider]  omeprazole (PRILOSEC OTC) 20 MG tablet Take 20 mg by mouth every morning.    [provider]  Polyethyl Glycol-Propyl Glycol (SYSTANE OP) Apply 1 drop to eye 3 (three) times daily.    [provider]  prednisoLONE acetate (PRED FORTE) 1 % ophthalmic suspension Place 1 drop into both eyes daily.    [provider]  Probiotic Product ([place]) Take 1 capsule by mouth daily.    [provider]  TURMERIC PO Take 1 tablet by mouth daily.    [provider]    Family History Family History  Problem Relation Age of Onset  . Colon cancer Other     Social History Social History  Substance Use Topics  . Smoking status: Former Smoker    Packs/day: 0.25    Years: 10.00  . Smokeless tobacco: Never Used  . Alcohol use No     Allergies   Forane [isoflurane]   Review of Systems Review of Systems  Constitutional: Negative for chills and fever.  HENT: Negative for ear pain and sore throat.   Eyes: Negative for pain and visual disturbance.  Respiratory: Negative for cough and shortness of breath.   Cardiovascular: Negative for chest pain and palpitations.  Gastrointestinal: Negative for abdominal pain and vomiting.  Genitourinary: Negative for dysuria and hematuria.  Musculoskeletal: Negative for gait problem, joint swelling, neck pain and neck stiffness.  Skin: Positive for wound. Negative for color change and rash.  Allergic/Immunologic: Negative  for immunocompromised state.  Neurological: Negative for seizures and syncope.  Hematological: Does not bruise/bleed easily.  Psychiatric/Behavioral: Negative for agitation and behavioral problems.  All other systems reviewed and are negative.    Physical Exam Updated Vital Signs BP (!) 123/45   Pulse 79   Resp 16   Ht 5\' 1"  (1.549 m)   Wt 94.3 kg (208 lb)   SpO2 97%   BMI 39.30 kg/m   Physical Exam  Constitutional: She is oriented to person, place, and time. She appears well-developed and well-nourished.  HENT:  Head: Normocephalic.  Right Ear: External ear normal.  Left Ear: External ear normal.  Mouth/Throat: Oropharynx is clear and moist. No oropharyngeal exudate.  Eyes: Pupils are equal, round, and reactive to light. Conjunctivae and EOM are normal.  Neck: Normal range of motion. No tracheal deviation present.  Cardiovascular: Normal rate and intact distal pulses.   Pulmonary/Chest: Effort normal. No respiratory distress.  Abdominal: Soft. She exhibits no distension.  Musculoskeletal: She exhibits no deformity.  Neurological: She is alert and oriented to person, place, and time. No cranial nerve deficit or sensory deficit. She exhibits normal muscle tone. Coordination normal.  Skin: Skin is warm and dry.  Small hemostatic abrasion to the bridge of the nose, 2 cm well approximated laceration to the left forehead.  Psychiatric: She has a normal mood and affect.     ED Treatments / Results  Labs (all labs ordered are listed, but only abnormal results are displayed) Labs Reviewed - No data to display  EKG  EKG Interpretation None       Radiology No results found.  Procedures .Marland KitchenLaceration Repair Date/Time: 11/01/2016 4:25 PM Performed by: Valda Lamb Authorized by: Valda Lamb   Consent:    Consent obtained:  Verbal   Consent given by:  Patient   Risks discussed:  Infection, poor cosmetic result and need for additional repair   Alternatives  discussed:  No treatment Anesthesia (see MAR for exact dosages):    Anesthesia method:  None Laceration details:    Location:  Scalp   Scalp location:  Frontal   Length (cm):  2   Depth (mm):  1 Repair type:    Repair type:  Simple Pre-procedure details:    Preparation:  Patient was prepped and draped in usual sterile fashion Exploration:    Contaminated: no   Treatment:    Area cleansed with:  Saline   Amount of cleaning:  Standard   Irrigation solution:  Sterile saline   Irrigation volume:  250   Irrigation method:  Syringe   Visualized foreign bodies/material removed: no   Skin repair:    Repair method:  Steri-Strips Approximation:    Approximation:  Close   Vermilion border: well-aligned   Post-procedure details:    Dressing:  Antibiotic ointment   Patient tolerance of procedure:  Tolerated well, no immediate complications     (including critical care time)  Medications Ordered in ED Medications  Tdap (BOOSTRIX) injection 0.5 mL (0.5 mLs Intramuscular Given 11/01/16 1553)     Initial Impression / Assessment and Plan / ED Course  I have reviewed the triage vital signs and the nursing notes.  Pertinent labs & imaging results that were available during my care of the patient were reviewed by me and considered in my medical decision making (see chart for details).     Patient presents mechanical fall. Small abrasions to knees with normal range of motion. Abrasion to the bridge of the nose and left forehead. Both hemostatic. Do appear well approximated and no need for suturing, but did benefit from steri strips, see procedure note above. Tetanus updated here in emergency department. No midface instability, Unremarkable Neurological exam. Cranial nerves II through XII are intact. Normal finger-nose. 5/5 strength in both upper and lower extremities. Sensation is intact throughout. Normal finger to nose. Able to ambulate. No altered mental status, nausea, vomiting since the  fall, and I do not believe CT face/head is indicated. Considering normal range of motion of knees and able to ambulate with minimal pain, do not believe x-ray imaging of knees is indicated.   Through decision making, patient  was comfortable going home, and I do believe she is safe for discharge at this time. Ambulating at time of discharge.  Patient was seen with my attending, Dr. Dayna Barker, who voiced agreement and oversaw the evaluation and treatment of this patient.   Dragon [employer] seismologist was used in the creation of this note. If there are any errors or inconsistencies needing clarification, please contact me directly.    Final Clinical Impressions(s) / ED Diagnoses   Final diagnoses:  Fall, initial encounter  Minor head injury, initial encounter  Abrasion    New Prescriptions New Prescriptions   No medications on file     Valda Lamb, MD 11/01/16 1629    Mesner, Corene Cornea, MD 11/02/16 1736

## 2016-11-01 NOTE — ED Notes (Signed)
Pt ambulated to rr and well with steady gait no pain.

## 2016-11-01 NOTE — ED Triage Notes (Signed)
Was at MD office got an epidural and was walking to her car and tripped and fell landing on her face. States she had her glasses on and they broke, small laceration over her left eye, and bridge of her nose. Abrasions to both knee. Bleeding controlled. Denies loc a/o denies neck or back pain

## 2016-11-10 DIAGNOSIS — H1850 Unspecified hereditary corneal dystrophies: Secondary | ICD-10-CM | POA: Diagnosis not present

## 2016-11-10 DIAGNOSIS — Z961 Presence of intraocular lens: Secondary | ICD-10-CM | POA: Diagnosis not present

## 2016-11-23 DIAGNOSIS — M48061 Spinal stenosis, lumbar region without neurogenic claudication: Secondary | ICD-10-CM | POA: Diagnosis not present

## 2016-11-24 DIAGNOSIS — Z808 Family history of malignant neoplasm of other organs or systems: Secondary | ICD-10-CM | POA: Diagnosis not present

## 2016-11-24 DIAGNOSIS — Z885 Allergy status to narcotic agent status: Secondary | ICD-10-CM | POA: Diagnosis not present

## 2016-11-24 DIAGNOSIS — Z79899 Other long term (current) drug therapy: Secondary | ICD-10-CM | POA: Diagnosis not present

## 2016-11-24 DIAGNOSIS — Z8041 Family history of malignant neoplasm of ovary: Secondary | ICD-10-CM | POA: Diagnosis not present

## 2016-11-24 DIAGNOSIS — Z8049 Family history of malignant neoplasm of other genital organs: Secondary | ICD-10-CM | POA: Diagnosis not present

## 2016-11-24 DIAGNOSIS — E785 Hyperlipidemia, unspecified: Secondary | ICD-10-CM | POA: Diagnosis not present

## 2016-11-24 DIAGNOSIS — Z7982 Long term (current) use of aspirin: Secondary | ICD-10-CM | POA: Diagnosis not present

## 2016-11-24 DIAGNOSIS — Z803 Family history of malignant neoplasm of breast: Secondary | ICD-10-CM | POA: Diagnosis not present

## 2016-11-24 DIAGNOSIS — Z853 Personal history of malignant neoplasm of breast: Secondary | ICD-10-CM | POA: Diagnosis not present

## 2016-11-24 DIAGNOSIS — C182 Malignant neoplasm of ascending colon: Secondary | ICD-10-CM | POA: Diagnosis not present

## 2016-11-24 DIAGNOSIS — D509 Iron deficiency anemia, unspecified: Secondary | ICD-10-CM | POA: Diagnosis not present

## 2016-11-24 DIAGNOSIS — Z888 Allergy status to other drugs, medicaments and biological substances status: Secondary | ICD-10-CM | POA: Diagnosis not present

## 2016-12-01 DIAGNOSIS — C50919 Malignant neoplasm of unspecified site of unspecified female breast: Secondary | ICD-10-CM | POA: Diagnosis not present

## 2016-12-01 DIAGNOSIS — C182 Malignant neoplasm of ascending colon: Secondary | ICD-10-CM | POA: Diagnosis not present

## 2016-12-01 DIAGNOSIS — D649 Anemia, unspecified: Secondary | ICD-10-CM | POA: Diagnosis not present

## 2016-12-06 IMAGING — CT CT ABD-PELV W/ CM
2 of 5 series · 17 of 46 positions shown, 19 images · IV contrast (Omnipaque 300)
Comparison: None.

CLINICAL DATA: Hematuria 1 day last week with continued left flank
pain. Bright red blood per rectum yesterday.

EXAM:
CT ABDOMEN AND PELVIS WITH CONTRAST
TECHNIQUE: Multidetector CT imaging of the abdomen and pelvis was performed
using the standard protocol following bolus administration of
intravenous contrast.
CONTRAST:  100 mL OMNIPAQUE IOHEXOL 300 MG/ML  SOLN

[Series 2: abd_pel_with 5.0 b40f · axial · 0.73mm/px · z∈[-442,-42]mm · 14 of 90 slices shown, 16 images]
[im 5/90  soft-tissue]
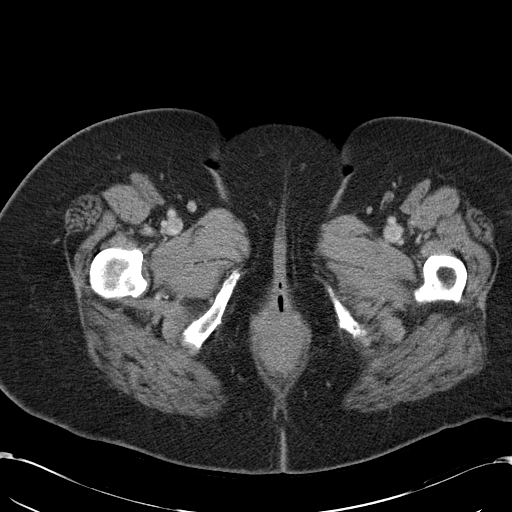
[im 5/90  bone]
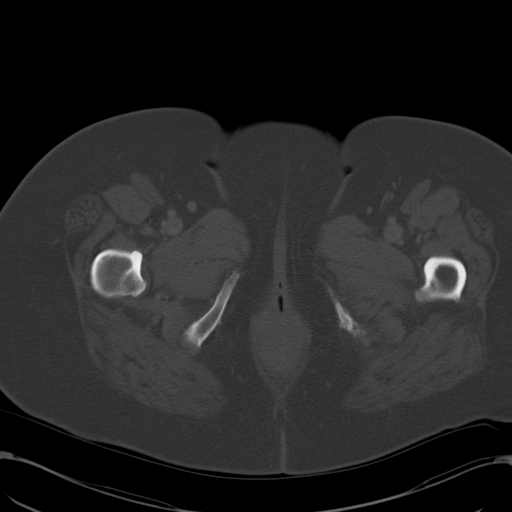
[im 10/90  soft-tissue]
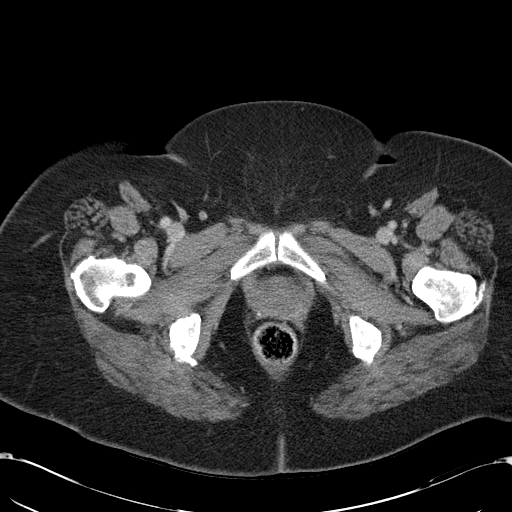
[im 20/90  soft-tissue]
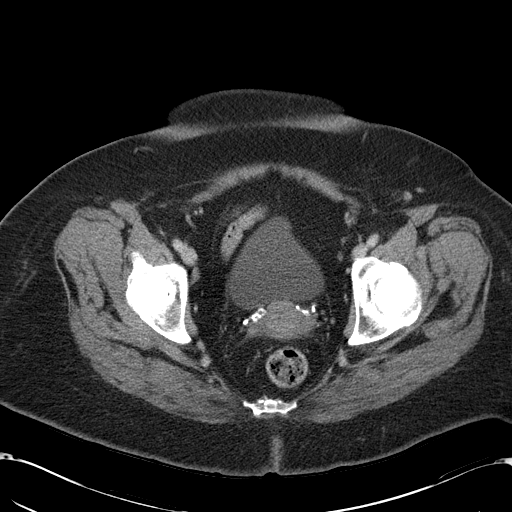
[im 25/90  soft-tissue]
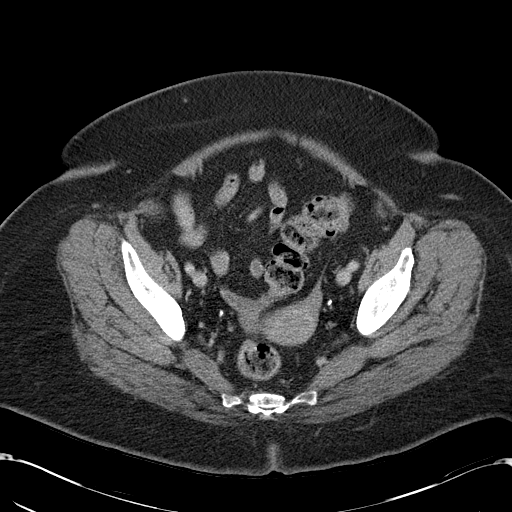
[im 30/90  soft-tissue]
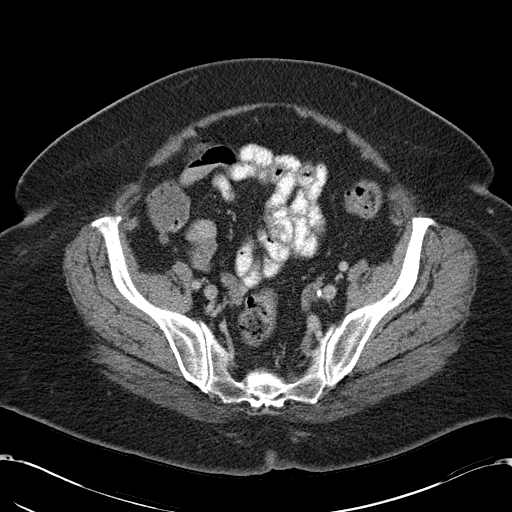
[im 35/90  soft-tissue]
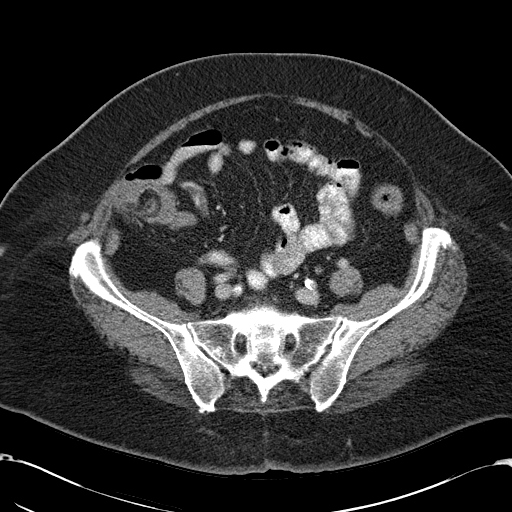
[im 40/90  soft-tissue]
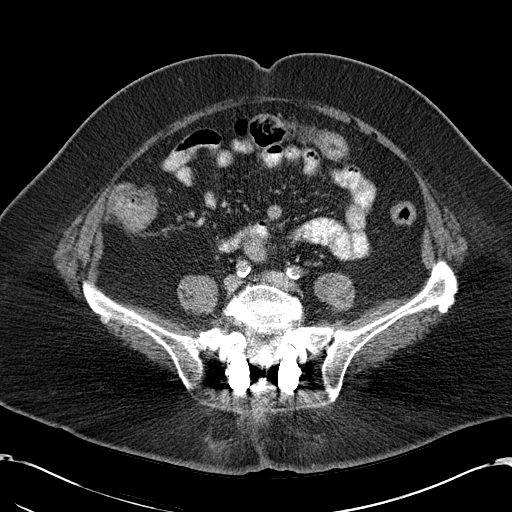
[im 50/90  soft-tissue]
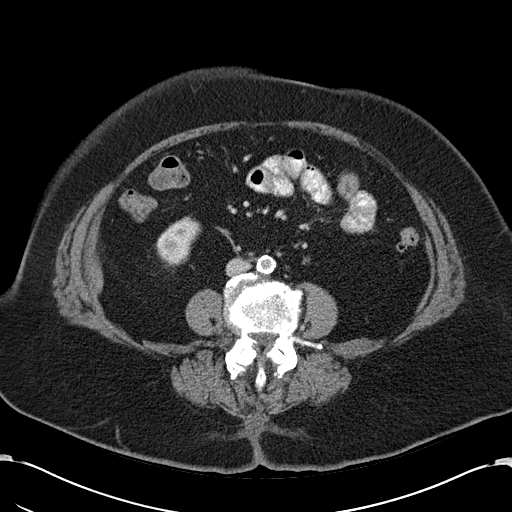
[im 55/90  soft-tissue]
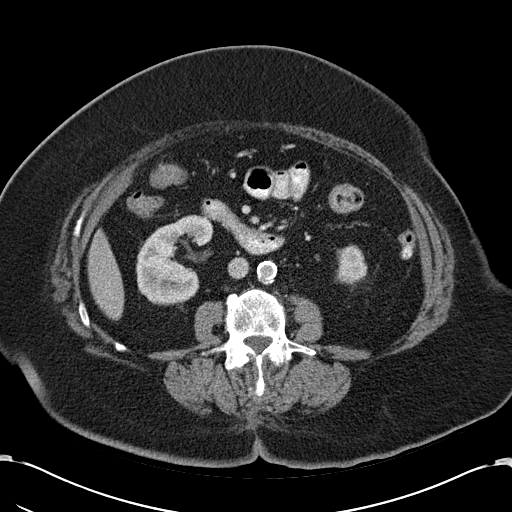
[im 55/90  bone]
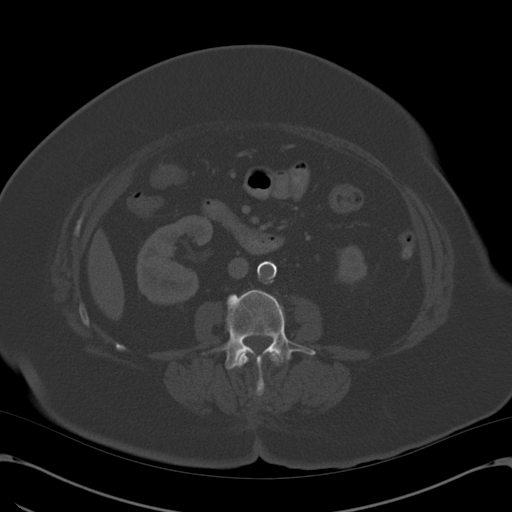
[im 60/90  soft-tissue]
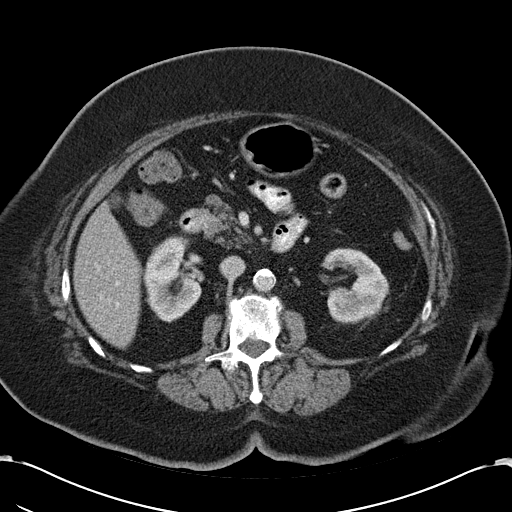
[im 65/90  soft-tissue]
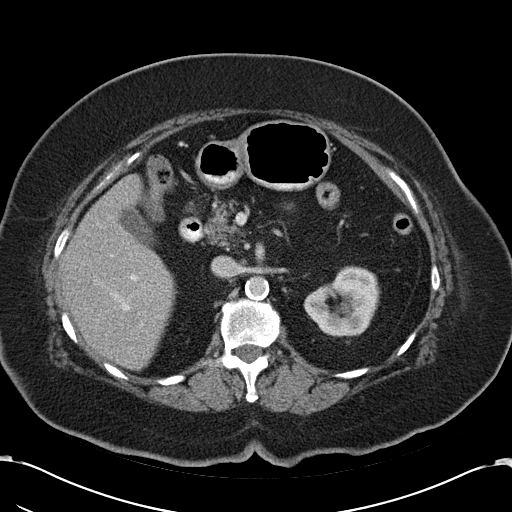
[im 70/90  soft-tissue]
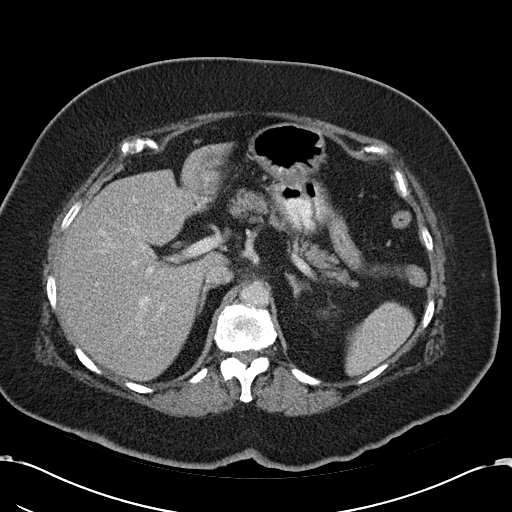
[im 80/90  soft-tissue]
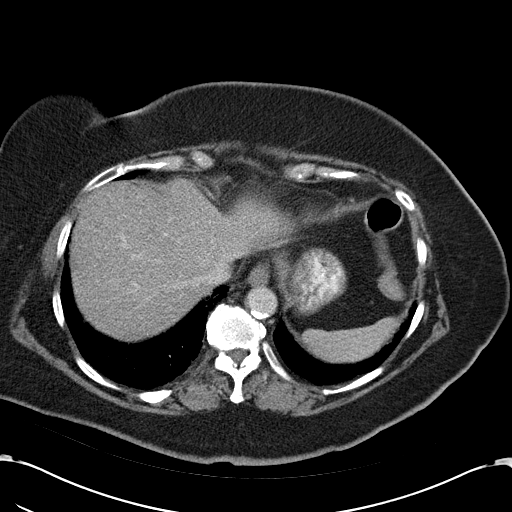
[im 85/90  soft-tissue]
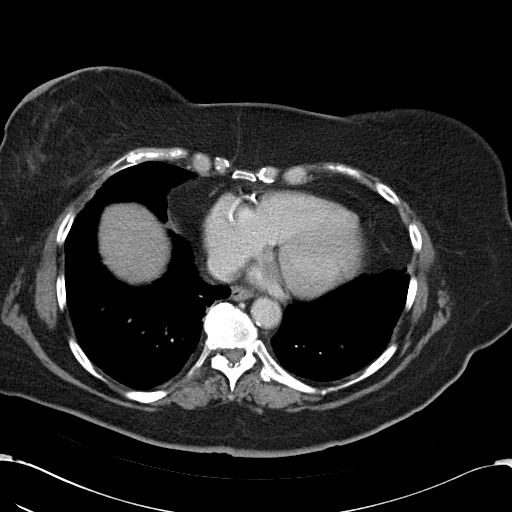

[Series 3: abd_pel_with 3.0 spo cor · coronal · 0.86mm/px · 3 of 87 slices shown]
[im 29/87  soft-tissue]
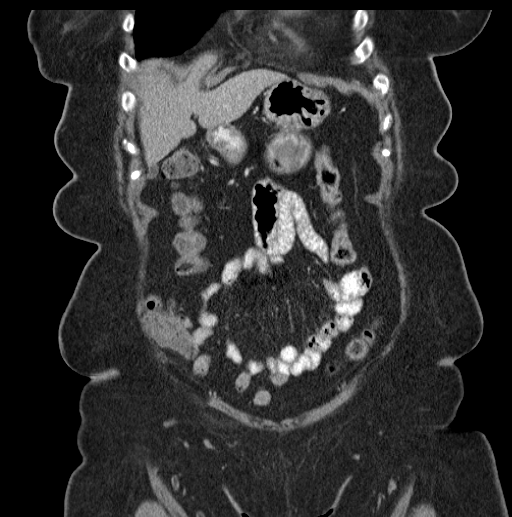
[im 39/87  soft-tissue]
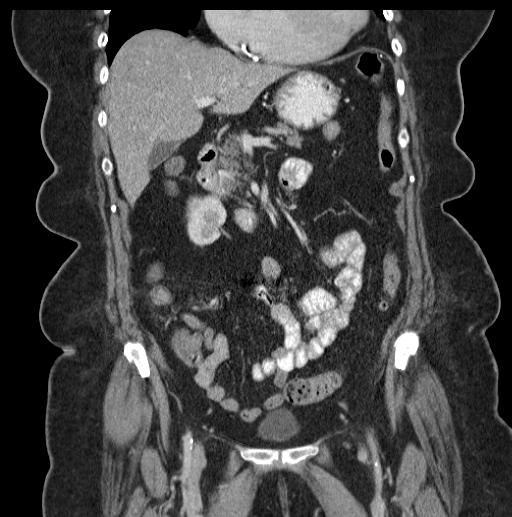
[im 48/87  soft-tissue]
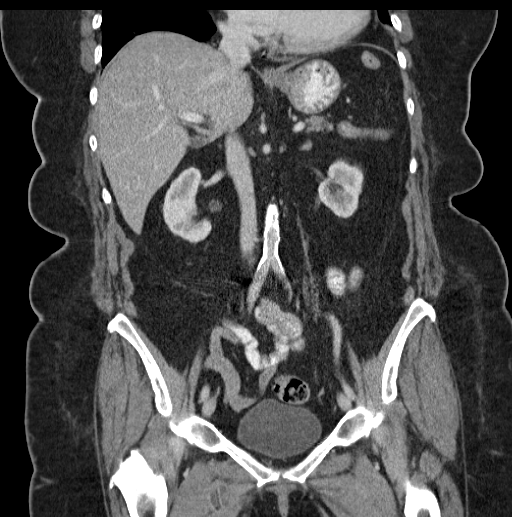

[17 of 46 positions shown; findings below may reference images not displayed]

FINDINGS: The lung bases are clear. No pleural or pericardial effusion.
Calcific aortic and coronary atherosclerosis is identified. There is
cardiomegaly. No pleural or pericardial effusion.

The liver is low attenuating consistent with fatty infiltration. No
focal liver lesion is seen. The gallbladder, spleen, adrenal glands,
pancreas and kidneys are unremarkable. Dense aortoiliac
atherosclerosis without aneurysm is identified.

Uterus, adnexa and urinary bladder appear normal. A few scattered
diverticula are seen along the sigmoid and descending colon but
there is no evidence of diverticulitis. The colon is otherwise
normal appearance. The stomach and small bowel appear normal. There
is no lymphadenopathy or fluid collection.

No lytic or sclerotic bony lesion is identified. The patient is
status post L4-S1 fusion. There is vacuum disc phenomenon and loss
of disc space height at L3-4.
IMPRESSION: No acute abnormality or finding to explain the patient's symptoms.

Mild diverticulosis without diverticulitis.

Fatty infiltration of the liver.

Calcific aortic and coronary atherosclerosis.

## 2016-12-15 DIAGNOSIS — I429 Cardiomyopathy, unspecified: Secondary | ICD-10-CM | POA: Diagnosis not present

## 2016-12-15 DIAGNOSIS — I2510 Atherosclerotic heart disease of native coronary artery without angina pectoris: Secondary | ICD-10-CM | POA: Diagnosis not present

## 2016-12-15 DIAGNOSIS — C182 Malignant neoplasm of ascending colon: Secondary | ICD-10-CM | POA: Diagnosis not present

## 2016-12-15 DIAGNOSIS — I340 Nonrheumatic mitral (valve) insufficiency: Secondary | ICD-10-CM | POA: Diagnosis not present

## 2016-12-15 DIAGNOSIS — E785 Hyperlipidemia, unspecified: Secondary | ICD-10-CM | POA: Diagnosis not present

## 2017-01-19 DIAGNOSIS — H0288B Meibomian gland dysfunction left eye, upper and lower eyelids: Secondary | ICD-10-CM | POA: Diagnosis not present

## 2017-01-19 DIAGNOSIS — H0288A Meibomian gland dysfunction right eye, upper and lower eyelids: Secondary | ICD-10-CM | POA: Diagnosis not present

## 2017-01-31 DIAGNOSIS — M79671 Pain in right foot: Secondary | ICD-10-CM | POA: Diagnosis not present

## 2017-01-31 DIAGNOSIS — M19071 Primary osteoarthritis, right ankle and foot: Secondary | ICD-10-CM | POA: Diagnosis not present

## 2017-01-31 DIAGNOSIS — M170 Bilateral primary osteoarthritis of knee: Secondary | ICD-10-CM | POA: Diagnosis not present

## 2017-03-02 DIAGNOSIS — Z853 Personal history of malignant neoplasm of breast: Secondary | ICD-10-CM | POA: Diagnosis not present

## 2017-03-02 DIAGNOSIS — Z79899 Other long term (current) drug therapy: Secondary | ICD-10-CM | POA: Diagnosis not present

## 2017-03-02 DIAGNOSIS — E785 Hyperlipidemia, unspecified: Secondary | ICD-10-CM | POA: Diagnosis not present

## 2017-03-02 DIAGNOSIS — C182 Malignant neoplasm of ascending colon: Secondary | ICD-10-CM | POA: Diagnosis not present

## 2017-03-02 DIAGNOSIS — D509 Iron deficiency anemia, unspecified: Secondary | ICD-10-CM | POA: Diagnosis not present

## 2017-03-02 DIAGNOSIS — Z888 Allergy status to other drugs, medicaments and biological substances status: Secondary | ICD-10-CM | POA: Diagnosis not present

## 2017-03-02 DIAGNOSIS — I509 Heart failure, unspecified: Secondary | ICD-10-CM | POA: Diagnosis not present

## 2017-03-02 DIAGNOSIS — Z7982 Long term (current) use of aspirin: Secondary | ICD-10-CM | POA: Diagnosis not present

## 2017-03-02 DIAGNOSIS — Z885 Allergy status to narcotic agent status: Secondary | ICD-10-CM | POA: Diagnosis not present

## 2017-03-15 DIAGNOSIS — Z23 Encounter for immunization: Secondary | ICD-10-CM | POA: Diagnosis not present

## 2017-04-25 DIAGNOSIS — L570 Actinic keratosis: Secondary | ICD-10-CM | POA: Diagnosis not present

## 2017-04-25 DIAGNOSIS — Z1283 Encounter for screening for malignant neoplasm of skin: Secondary | ICD-10-CM | POA: Diagnosis not present

## 2017-04-25 DIAGNOSIS — L821 Other seborrheic keratosis: Secondary | ICD-10-CM | POA: Diagnosis not present

## 2017-04-25 DIAGNOSIS — X32XXXD Exposure to sunlight, subsequent encounter: Secondary | ICD-10-CM | POA: Diagnosis not present

## 2017-04-30 DIAGNOSIS — B029 Zoster without complications: Secondary | ICD-10-CM | POA: Diagnosis not present

## 2017-05-25 DIAGNOSIS — D509 Iron deficiency anemia, unspecified: Secondary | ICD-10-CM | POA: Diagnosis not present

## 2017-05-25 DIAGNOSIS — Z885 Allergy status to narcotic agent status: Secondary | ICD-10-CM | POA: Diagnosis not present

## 2017-05-25 DIAGNOSIS — H26492 Other secondary cataract, left eye: Secondary | ICD-10-CM | POA: Diagnosis not present

## 2017-05-25 DIAGNOSIS — Z853 Personal history of malignant neoplasm of breast: Secondary | ICD-10-CM | POA: Diagnosis not present

## 2017-05-25 DIAGNOSIS — Z808 Family history of malignant neoplasm of other organs or systems: Secondary | ICD-10-CM | POA: Diagnosis not present

## 2017-05-25 DIAGNOSIS — Z8049 Family history of malignant neoplasm of other genital organs: Secondary | ICD-10-CM | POA: Diagnosis not present

## 2017-05-25 DIAGNOSIS — C182 Malignant neoplasm of ascending colon: Secondary | ICD-10-CM | POA: Diagnosis not present

## 2017-05-25 DIAGNOSIS — I509 Heart failure, unspecified: Secondary | ICD-10-CM | POA: Diagnosis not present

## 2017-05-25 DIAGNOSIS — Z803 Family history of malignant neoplasm of breast: Secondary | ICD-10-CM | POA: Diagnosis not present

## 2017-05-25 DIAGNOSIS — E785 Hyperlipidemia, unspecified: Secondary | ICD-10-CM | POA: Diagnosis not present

## 2017-05-25 DIAGNOSIS — Z8041 Family history of malignant neoplasm of ovary: Secondary | ICD-10-CM | POA: Diagnosis not present

## 2017-05-25 DIAGNOSIS — Z888 Allergy status to other drugs, medicaments and biological substances status: Secondary | ICD-10-CM | POA: Diagnosis not present

## 2017-06-01 DIAGNOSIS — Z808 Family history of malignant neoplasm of other organs or systems: Secondary | ICD-10-CM | POA: Diagnosis not present

## 2017-06-01 DIAGNOSIS — C182 Malignant neoplasm of ascending colon: Secondary | ICD-10-CM | POA: Diagnosis not present

## 2017-06-01 DIAGNOSIS — Z888 Allergy status to other drugs, medicaments and biological substances status: Secondary | ICD-10-CM | POA: Diagnosis not present

## 2017-06-01 DIAGNOSIS — I340 Nonrheumatic mitral (valve) insufficiency: Secondary | ICD-10-CM | POA: Diagnosis not present

## 2017-06-01 DIAGNOSIS — Z8041 Family history of malignant neoplasm of ovary: Secondary | ICD-10-CM | POA: Diagnosis not present

## 2017-06-01 DIAGNOSIS — C50919 Malignant neoplasm of unspecified site of unspecified female breast: Secondary | ICD-10-CM | POA: Diagnosis not present

## 2017-06-01 DIAGNOSIS — D509 Iron deficiency anemia, unspecified: Secondary | ICD-10-CM | POA: Diagnosis not present

## 2017-06-01 DIAGNOSIS — E785 Hyperlipidemia, unspecified: Secondary | ICD-10-CM | POA: Diagnosis not present

## 2017-06-01 DIAGNOSIS — R110 Nausea: Secondary | ICD-10-CM | POA: Diagnosis not present

## 2017-06-01 DIAGNOSIS — Z803 Family history of malignant neoplasm of breast: Secondary | ICD-10-CM | POA: Diagnosis not present

## 2017-06-01 DIAGNOSIS — Z853 Personal history of malignant neoplasm of breast: Secondary | ICD-10-CM | POA: Diagnosis not present

## 2017-06-01 DIAGNOSIS — Z885 Allergy status to narcotic agent status: Secondary | ICD-10-CM | POA: Diagnosis not present

## 2017-06-01 DIAGNOSIS — Z8049 Family history of malignant neoplasm of other genital organs: Secondary | ICD-10-CM | POA: Diagnosis not present

## 2017-06-01 DIAGNOSIS — I2510 Atherosclerotic heart disease of native coronary artery without angina pectoris: Secondary | ICD-10-CM | POA: Diagnosis not present

## 2017-06-01 DIAGNOSIS — I429 Cardiomyopathy, unspecified: Secondary | ICD-10-CM | POA: Diagnosis not present

## 2017-06-03 DIAGNOSIS — H26492 Other secondary cataract, left eye: Secondary | ICD-10-CM | POA: Diagnosis not present

## 2017-06-15 DIAGNOSIS — R112 Nausea with vomiting, unspecified: Secondary | ICD-10-CM | POA: Diagnosis not present

## 2017-06-15 DIAGNOSIS — C182 Malignant neoplasm of ascending colon: Secondary | ICD-10-CM | POA: Diagnosis not present

## 2017-07-01 ENCOUNTER — Other Ambulatory Visit (INDEPENDENT_AMBULATORY_CARE_PROVIDER_SITE_OTHER): Payer: Self-pay | Admitting: *Deleted

## 2017-07-01 DIAGNOSIS — Z85038 Personal history of other malignant neoplasm of large intestine: Secondary | ICD-10-CM

## 2017-07-06 DIAGNOSIS — Z1231 Encounter for screening mammogram for malignant neoplasm of breast: Secondary | ICD-10-CM | POA: Diagnosis not present

## 2017-07-18 ENCOUNTER — Encounter (INDEPENDENT_AMBULATORY_CARE_PROVIDER_SITE_OTHER): Payer: Self-pay | Admitting: *Deleted

## 2017-07-18 ENCOUNTER — Telehealth (INDEPENDENT_AMBULATORY_CARE_PROVIDER_SITE_OTHER): Payer: Self-pay | Admitting: *Deleted

## 2017-07-18 MED ORDER — PREPOPIK 10-3.5-12 MG-GM-GM PO PACK: 1.0000 | PACK | Freq: Once | ORAL | 0 refills | Status: AC

## 2017-07-18 NOTE — Telephone Encounter (Signed)
agree

## 2017-07-18 NOTE — Telephone Encounter (Signed)
Patient needs prepopik

## 2017-07-18 NOTE — Telephone Encounter (Signed)
Referring MD/PCP: bosh   Procedure: tcs  Reason/Indication:  Hx colon ca  Has patient had this procedure before?  Yes, 2018  If so, when, by whom and where?    Is there a family history of colon cancer?  Yes, uncles  Who?  What age when diagnosed?    Is patient diabetic?   no      Does patient have prosthetic heart valve or mechanical valve?  no  Do you have a pacemaker?  no  Has patient ever had endocarditis? no  Has patient had joint replacement within last 12 months?  no  Is patient constipated or do they take laxatives? no  Does patient have a history of alcohol/drug use?  no  Is patient on blood thinner such as Coumadin, Plavix and/or Aspirin? yes  Medications: see epic  Allergies: nkd  Medication Adjustment per Dr Lindi Adie, NP: asa 2 days  Procedure date & time: 08/18/17 at 930

## 2017-07-26 DIAGNOSIS — M25871 Other specified joint disorders, right ankle and foot: Secondary | ICD-10-CM | POA: Diagnosis not present

## 2017-07-26 DIAGNOSIS — M19071 Primary osteoarthritis, right ankle and foot: Secondary | ICD-10-CM | POA: Diagnosis not present

## 2017-07-26 DIAGNOSIS — Z96661 Presence of right artificial ankle joint: Secondary | ICD-10-CM | POA: Diagnosis not present

## 2017-08-09 ENCOUNTER — Ambulatory Visit (INDEPENDENT_AMBULATORY_CARE_PROVIDER_SITE_OTHER): Payer: Medicare Other | Admitting: Internal Medicine

## 2017-08-11 DIAGNOSIS — M19071 Primary osteoarthritis, right ankle and foot: Secondary | ICD-10-CM | POA: Diagnosis not present

## 2017-08-11 DIAGNOSIS — M25871 Other specified joint disorders, right ankle and foot: Secondary | ICD-10-CM | POA: Diagnosis not present

## 2017-08-11 DIAGNOSIS — Z96661 Presence of right artificial ankle joint: Secondary | ICD-10-CM | POA: Diagnosis not present

## 2017-08-18 ENCOUNTER — Encounter (HOSPITAL_COMMUNITY): Payer: Self-pay

## 2017-08-18 ENCOUNTER — Other Ambulatory Visit: Payer: Self-pay

## 2017-08-18 ENCOUNTER — Encounter (HOSPITAL_COMMUNITY)
Admission: RE | Disposition: A | Discharge status: Home / Self Care | Payer: Self-pay | Source: Ambulatory Visit | Attending: Internal Medicine

## 2017-08-18 ENCOUNTER — Ambulatory Visit (HOSPITAL_COMMUNITY)
Admission: RE | Admit: 2017-08-18 | Discharge: 2017-08-18 | Disposition: A | Discharge status: Home / Self Care | Payer: Medicare Other | Source: Ambulatory Visit | Attending: Internal Medicine | Admitting: Internal Medicine

## 2017-08-18 DIAGNOSIS — D123 Benign neoplasm of transverse colon: Secondary | ICD-10-CM | POA: Insufficient documentation

## 2017-08-18 DIAGNOSIS — Z87891 Personal history of nicotine dependence: Secondary | ICD-10-CM | POA: Diagnosis not present

## 2017-08-18 DIAGNOSIS — Z7982 Long term (current) use of aspirin: Secondary | ICD-10-CM | POA: Insufficient documentation

## 2017-08-18 DIAGNOSIS — Z980 Intestinal bypass and anastomosis status: Secondary | ICD-10-CM | POA: Diagnosis not present

## 2017-08-18 DIAGNOSIS — E785 Hyperlipidemia, unspecified: Secondary | ICD-10-CM | POA: Insufficient documentation

## 2017-08-18 DIAGNOSIS — M1990 Unspecified osteoarthritis, unspecified site: Secondary | ICD-10-CM | POA: Diagnosis not present

## 2017-08-18 DIAGNOSIS — Z885 Allergy status to narcotic agent status: Secondary | ICD-10-CM | POA: Diagnosis not present

## 2017-08-18 DIAGNOSIS — Z08 Encounter for follow-up examination after completed treatment for malignant neoplasm: Secondary | ICD-10-CM | POA: Diagnosis not present

## 2017-08-18 DIAGNOSIS — Z8371 Family history of colonic polyps: Secondary | ICD-10-CM | POA: Insufficient documentation

## 2017-08-18 DIAGNOSIS — Z79899 Other long term (current) drug therapy: Secondary | ICD-10-CM | POA: Insufficient documentation

## 2017-08-18 DIAGNOSIS — K635 Polyp of colon: Secondary | ICD-10-CM | POA: Insufficient documentation

## 2017-08-18 DIAGNOSIS — Z86010 Personal history of colonic polyps: Secondary | ICD-10-CM | POA: Diagnosis not present

## 2017-08-18 DIAGNOSIS — Z800 Family history of malignant neoplasm of digestive organs: Secondary | ICD-10-CM | POA: Insufficient documentation

## 2017-08-18 DIAGNOSIS — Z96661 Presence of right artificial ankle joint: Secondary | ICD-10-CM | POA: Diagnosis not present

## 2017-08-18 DIAGNOSIS — Z1211 Encounter for screening for malignant neoplasm of colon: Secondary | ICD-10-CM | POA: Diagnosis not present

## 2017-08-18 DIAGNOSIS — Z853 Personal history of malignant neoplasm of breast: Secondary | ICD-10-CM | POA: Insufficient documentation

## 2017-08-18 DIAGNOSIS — Z9049 Acquired absence of other specified parts of digestive tract: Secondary | ICD-10-CM | POA: Diagnosis not present

## 2017-08-18 DIAGNOSIS — Z888 Allergy status to other drugs, medicaments and biological substances status: Secondary | ICD-10-CM | POA: Diagnosis not present

## 2017-08-18 DIAGNOSIS — K644 Residual hemorrhoidal skin tags: Secondary | ICD-10-CM | POA: Diagnosis not present

## 2017-08-18 DIAGNOSIS — R002 Palpitations: Secondary | ICD-10-CM | POA: Diagnosis not present

## 2017-08-18 DIAGNOSIS — K5730 Diverticulosis of large intestine without perforation or abscess without bleeding: Secondary | ICD-10-CM | POA: Diagnosis not present

## 2017-08-18 DIAGNOSIS — I252 Old myocardial infarction: Secondary | ICD-10-CM | POA: Insufficient documentation

## 2017-08-18 DIAGNOSIS — I2510 Atherosclerotic heart disease of native coronary artery without angina pectoris: Secondary | ICD-10-CM | POA: Diagnosis not present

## 2017-08-18 DIAGNOSIS — I509 Heart failure, unspecified: Secondary | ICD-10-CM | POA: Diagnosis not present

## 2017-08-18 DIAGNOSIS — K219 Gastro-esophageal reflux disease without esophagitis: Secondary | ICD-10-CM | POA: Diagnosis not present

## 2017-08-18 DIAGNOSIS — Z85038 Personal history of other malignant neoplasm of large intestine: Secondary | ICD-10-CM | POA: Insufficient documentation

## 2017-08-18 HISTORY — PX: COLONOSCOPY: SHX5424

## 2017-08-18 HISTORY — PX: POLYPECTOMY: SHX5525

## 2017-08-18 SURGERY — COLONOSCOPY
Anesthesia: Moderate Sedation

## 2017-08-18 MED ORDER — MIDAZOLAM HCL 5 MG/5ML IJ SOLN
INTRAMUSCULAR | Status: AC
  Filled 2017-08-18: qty 10

## 2017-08-18 MED ORDER — MIDAZOLAM HCL 5 MG/5ML IJ SOLN
INTRAMUSCULAR | Status: DC | PRN
  Administered 2017-08-18 (×2): 1 mg via INTRAVENOUS
  Administered 2017-08-18: 2 mg via INTRAVENOUS
  Administered 2017-08-18: 1 mg via INTRAVENOUS

## 2017-08-18 MED ORDER — MEPERIDINE HCL 50 MG/ML IJ SOLN
INTRAMUSCULAR | Status: AC
  Filled 2017-08-18: qty 1

## 2017-08-18 MED ORDER — FENTANYL CITRATE (PF) 100 MCG/2ML IJ SOLN
INTRAMUSCULAR | Status: AC
  Filled 2017-08-18: qty 2

## 2017-08-18 MED ORDER — ONDANSETRON HCL 4 MG/2ML IJ SOLN
4.0000 mg | Freq: Once | INTRAMUSCULAR | Status: AC
  Administered 2017-08-18: 4 mg via INTRAVENOUS

## 2017-08-18 MED ORDER — ONDANSETRON HCL 4 MG/2ML IJ SOLN
INTRAMUSCULAR | Status: AC
  Filled 2017-08-18: qty 2

## 2017-08-18 MED ORDER — SODIUM CHLORIDE 0.9 % IV SOLN
INTRAVENOUS | Status: DC
  Administered 2017-08-18: 09:00:00 via INTRAVENOUS

## 2017-08-18 MED ORDER — FENTANYL CITRATE (PF) 100 MCG/2ML IJ SOLN
INTRAMUSCULAR | Status: DC | PRN
  Administered 2017-08-18: 25 ug via INTRAVENOUS

## 2017-08-18 MED ORDER — STERILE WATER FOR IRRIGATION IR SOLN
Status: DC | PRN
  Administered 2017-08-18: 2.5 mL

## 2017-08-18 NOTE — H&P (Signed)
Erin Cooke is an 77 y.o. female.   Chief Complaint: Patient is here for colonoscopy. HPI: Patient is 77 year old Caucasian female with history of colon carcinoma and multiple colonic polyps.  She had a right hemicolectomy in February 2017.  Last exam was 1 year ago with removal of large sessile serrated polyp along with another polyp.  She feels fine.  Last year she did require iron infusion twice.  She denies melena or rectal bleeding or chronic diarrhea. Family history significant for colonic polyps and her son.  She has 3 maternal uncles and a first cousin recite all of whom had colon cancer in their 52s.  Past Medical History:  Diagnosis Date  . Arthritis   . Cancer (HCC)    breast  . CHF (congestive heart failure) ([place])   . Chronic back pain    synovial cyst,radiculopathy,and stenosis  . Colon cancer ([place])   . Coronary artery disease   . Dyspnea   . Erosive esophagitis   . GERD (gastroesophageal reflux disease)    takes OTC Prilosec daily  . History of colon polyps   . Hyperlipidemia    takes Atorvastatin daily  . Microscopic colitis   . Myocardial infarction ([place]) 1989, 2017  . Palpitations    takes betapace daily  . PONV (postoperative nausea and vomiting)    states Forane elevated her liver enzymes  . Stress incontinence   . Thyroid nodule    benign    Past Surgical History:  Procedure Laterality Date  . ANKLE SURGERY Right 1990   pins and plate  . APPENDECTOMY    . BACK SURGERY  2011  . BIOPSY THYROID    . CARDIAC CATHETERIZATION  1989/1999  . CARPAL TUNNEL RELEASE Right 2004  . cataract surgery with corneal transplant  Right 2008  . cataract surgery with corneal transplant  Left 2014  . COLON SURGERY  2017  . COLONOSCOPY    . COLONOSCOPY N/A 05/01/2015   Procedure: COLONOSCOPY;  Surgeon: Rogene Houston, MD;  [facility];  Service: Endoscopy;  Laterality: N/A;  . COLONOSCOPY N/A 07/28/2016   Procedure: COLONOSCOPY;  Surgeon: Rogene Houston,  MD;  [facility];  Service: Endoscopy;  Laterality: N/A;  830 - moved to 4/11 @ 12:00  . CORONARY ANGIOPLASTY     2 stents  . DILATION AND CURETTAGE OF UTERUS  2014  . ESOPHAGOGASTRODUODENOSCOPY    . ESOPHAGOGASTRODUODENOSCOPY N/A 07/28/2016   Procedure: ESOPHAGOGASTRODUODENOSCOPY (EGD);  Surgeon: Rogene Houston, MD;  [facility];  Service: Endoscopy;  Laterality: N/A;  . eye lid surgery Bilateral 2014  . MASTECTOMY, PARTIAL Right 2013  . ROTATOR CUFF REPAIR Left 1989  . ROTATOR CUFF REPAIR Right 2001  . SPINAL FUSION  2012  . TOTAL ANKLE REPLACEMENT Right 2012    Family History  Problem Relation Age of Onset  . Colon cancer Other    Social History:  reports that she has quit smoking. She has a 2.50 pack-year smoking history. She has never used smokeless tobacco. She reports that she does not drink alcohol or use drugs.  Allergies:  Allergies  Allergen Reactions  . Demerol [Meperidine Hcl] Nausea And Vomiting  . Forane [Isoflurane] Other (See Comments)    Elevated liver enzymes    Medications Prior to Admission  Medication Sig Dispense Refill  . acetaminophen (TYLENOL) 500 MG tablet Take 1,000 mg by mouth 2 (two) times daily as needed for moderate pain or headache.    Marland Kitchen  aspirin EC 81 MG tablet Take 1 tablet (81 mg total) by mouth daily.    Marland Kitchen atorvastatin (LIPITOR) 80 MG tablet Take 80 mg by mouth every evening.     . Biotin 10000 MCG TABS Take 10,000 mcg by mouth daily.    . diphenhydrAMINE (BENADRYL) 25 MG tablet Take 25 mg by mouth at bedtime as needed for sleep.    . furosemide (LASIX) 20 MG tablet Take 20 mg by mouth daily.     Marland Kitchen loperamide (IMODIUM) 2 MG capsule Take 2 mg by mouth as needed for diarrhea or loose stools.    Marland Kitchen losartan (COZAAR) 25 MG tablet Take 25 mg by mouth daily.    . metoprolol (LOPRESSOR) 100 MG tablet Take 100 mg by mouth 2 (two) times daily.    . Multiple Vitamin (MULTIVITAMIN WITH MINERALS) TABS tablet Take 1 tablet by mouth  daily.    . naproxen sodium (ALEVE) 220 MG tablet Take 440 mg by mouth daily as needed (for pain).    Marland Kitchen omeprazole (PRILOSEC OTC) 20 MG tablet Take 20 mg by mouth every morning.    Vladimir Faster Glycol-Propyl Glycol (SYSTANE OP) Place 1 drop into both eyes 3 (three) times daily as needed (for dry eyes).     . prednisoLONE acetate (PRED FORTE) 1 % ophthalmic suspension Place 1 drop into both eyes daily.    . Probiotic Product ([place]ON HEALTH PO) Take 1 capsule by mouth daily.    Marland Kitchen HYDROcodone-acetaminophen (NORCO/VICODIN) 5-325 MG tablet Take 1 tablet by mouth 3 (three) times daily as needed for moderate pain. (Patient not taking: Reported on 08/11/2017) 42 tablet 0  . nitroGLYCERIN (NITROSTAT) 0.4 MG SL tablet Place 0.4 mg under the tongue every 5 (five) minutes as needed for chest pain.      No results found for this or any previous visit (from the past 48 hour(s)). No results found.  ROS  Blood pressure (!) 148/71, pulse 77, temperature 98.2 F (36.8 C), temperature source Oral, resp. rate 14, height 5\' 1"  (1.549 m), weight 202 lb (91.6 kg), SpO2 98 %. Physical Exam  Constitutional: She appears well-developed and well-nourished.  HENT:  Mouth/Throat: Oropharynx is clear and moist.  Eyes: Conjunctivae are normal. No scleral icterus.  Neck: No thyromegaly present.  Cardiovascular: Normal rate, regular rhythm and normal heart sounds.  No murmur heard. Respiratory: Effort normal and breath sounds normal.  GI:  Abdomen is full.  There is small midline scar below the umbilicus.  Abdomen is soft and nontender with organomegaly or masses.  Musculoskeletal: She exhibits no edema.  Lymphadenopathy:    She has no cervical adenopathy.  Neurological: She is alert.  Skin: Skin is warm and dry.     Assessment/Plan History of colonic polyps and carcinoma. Surveillance colonoscopy.  Hildred Laser, MD 08/18/2017, 9:51 AM

## 2017-08-18 NOTE — Discharge Instructions (Signed)
No aspirin or NSAIDs for 1 week. Resume other medications as before. High-fiber diet. No driving for 24 hours. Physician will call with biopsy results.   Colonoscopy, Adult, Care After This sheet gives you information about how to care for yourself after your procedure. Your doctor may also give you more specific instructions. If you have problems or questions, call your doctor. Follow these instructions at home: General instructions   For the first 24 hours after the procedure: ? Do not drive or use machinery. ? Do not sign important documents. ? Do not drink alcohol. ? Do your daily activities more slowly than normal. ? Eat foods that are soft and easy to digest. ? Rest often.  Take over-the-counter or prescription medicines only as told by your doctor.  It is up to you to get the results of your procedure. Ask your doctor, or the department performing the procedure, when your results will be ready. To help cramping and bloating:  Try walking around.  Put heat on your belly (abdomen) as told by your doctor. Use a heat source that your doctor recommends, such as a moist heat pack or a heating pad. ? Put a towel between your skin and the heat source. ? Leave the heat on for 20-30 minutes. ? Remove the heat if your skin turns bright red. This is especially important if you cannot feel pain, heat, or cold. You can get burned. Eating and drinking  Drink enough fluid to keep your pee (urine) clear or pale yellow.  Return to your normal diet as told by your doctor. Avoid heavy or fried foods that are hard to digest.  Avoid drinking alcohol for as long as told by your doctor. Contact a doctor if:  You have blood in your poop (stool) 2-3 days after the procedure. Get help right away if:  You have more than a small amount of blood in your poop.  You see large clumps of tissue (blood clots) in your poop.  Your belly is swollen.  You feel sick to your stomach  (nauseous).  You throw up (vomit).  You have a fever.  You have belly pain that gets worse, and medicine does not help your pain. This information is not intended to replace advice given to you by your health care provider. Make sure you discuss any questions you have with your health care provider. Document Released: 05/08/2010 Document Revised: 12/29/2015 Document Reviewed: 12/29/2015 Elsevier Interactive Patient Education  2017 [employer]  Hemorrhoids Hemorrhoids are swollen veins in and around the rectum or anus. There are two types of hemorrhoids:  Internal hemorrhoids. These occur in the veins that are just inside the rectum. They may poke through to the outside and become irritated and painful.  External hemorrhoids. These occur in the veins that are outside of the anus and can be felt as a painful swelling or hard lump near the anus.  Most hemorrhoids do not cause serious problems, and they can be managed with home treatments such as diet and lifestyle changes. If home treatments do not help your symptoms, procedures can be done to shrink or remove the hemorrhoids. What are the causes? This condition is caused by increased pressure in the anal area. This pressure may result from various things, including:  Constipation.  Straining to have a bowel movement.  Diarrhea.  Pregnancy.  Obesity.  Sitting for long periods of time.  Heavy lifting or other activity that causes you to strain.  Anal sex.  What are  the signs or symptoms? Symptoms of this condition include:  Pain.  Anal itching or irritation.  Rectal bleeding.  Leakage of stool (feces).  Anal swelling.  One or more lumps around the anus.  How is this diagnosed? This condition can often be diagnosed through a visual exam. Other exams or tests may also be done, such as:  Examination of the rectal area with a gloved hand (digital rectal exam).  Examination of the anal canal using a small tube  (anoscope).  A blood test, if you have lost a significant amount of blood.  A test to look inside the colon (sigmoidoscopy or colonoscopy).  How is this treated? This condition can usually be treated at home. However, various procedures may be done if dietary changes, lifestyle changes, and other home treatments do not help your symptoms. These procedures can help make the hemorrhoids smaller or remove them completely. Some of these procedures involve surgery, and others do not. Common procedures include:  Rubber band ligation. Rubber bands are placed at the base of the hemorrhoids to cut off the blood supply to them.  Sclerotherapy. Medicine is injected into the hemorrhoids to shrink them.  Infrared coagulation. A type of light energy is used to get rid of the hemorrhoids.  Hemorrhoidectomy surgery. The hemorrhoids are surgically removed, and the veins that supply them are tied off.  Stapled hemorrhoidopexy surgery. A circular stapling device is used to remove the hemorrhoids and use staples to cut off the blood supply to them.  Follow these instructions at home: Eating and drinking  Eat foods that have a lot of fiber in them, such as whole grains, beans, nuts, fruits, and vegetables. Ask your health care provider about taking products that have added fiber (fiber supplements).  Drink enough fluid to keep your urine clear or pale yellow. Managing pain and swelling  Take warm sitz baths for 20 minutes, 3-4 times a day to ease pain and discomfort.  If directed, apply ice to the affected area. Using ice packs between sitz baths may be helpful. ? Put ice in a plastic bag. ? Place a towel between your skin and the bag. ? Leave the ice on for 20 minutes, 2-3 times a day. General instructions  Take over-the-counter and prescription medicines only as told by your health care provider.  Use medicated creams or suppositories as told.  Exercise regularly.  Go to the bathroom when you  have the urge to have a bowel movement. Do not wait.  Avoid straining to have bowel movements.  Keep the anal area dry and clean. Use wet toilet paper or moist towelettes after a bowel movement.  Do not sit on the toilet for long periods of time. This increases blood pooling and pain. Contact a health care provider if:  You have increasing pain and swelling that are not controlled by treatment or medicine.  You have uncontrolled bleeding.  You have difficulty having a bowel movement, or you are unable to have a bowel movement.  You have pain or inflammation outside the area of the hemorrhoids. This information is not intended to replace advice given to you by your health care provider. Make sure you discuss any questions you have with your health care provider. Document Released: 04/02/2000 Document Revised: 09/03/2015 Document Reviewed: 12/18/2014 Elsevier Interactive Patient Education  2018 [employer].  Diverticulosis Diverticulosis is a condition that develops when small pouches (diverticula) form in the wall of the large intestine (colon). The colon is where water is absorbed  and stool is formed. The pouches form when the inside layer of the colon pushes through weak spots in the outer layers of the colon. You may have a few pouches or many of them. What are the causes? The cause of this condition is not known. What increases the risk? The following factors may make you more likely to develop this condition:  Being older than age 32. Your risk for this condition increases with age. Diverticulosis is rare among people younger than age 52. By age 55, many people have it.  Eating a low-fiber diet.  Having frequent constipation.  Being overweight.  Not getting enough exercise.  Smoking.  Taking over-the-counter pain medicines, like aspirin and ibuprofen.  Having a family history of diverticulosis.  What are the signs or symptoms? In most people, there are no symptoms of  this condition. If you do have symptoms, they may include:  Bloating.  Cramps in the abdomen.  Constipation or diarrhea.  Pain in the lower left side of the abdomen.  How is this diagnosed? This condition is most often diagnosed during an exam for other colon problems. Because diverticulosis usually has no symptoms, it often cannot be diagnosed independently. This condition may be diagnosed by:  Using a flexible scope to examine the colon (colonoscopy).  Taking an X-ray of the colon after dye has been put into the colon (barium enema).  Doing a CT scan.  How is this treated? You may not need treatment for this condition if you have never developed an infection related to diverticulosis. If you have had an infection before, treatment may include:  Eating a high-fiber diet. This may include eating more fruits, vegetables, and grains.  Taking a fiber supplement.  Taking a live bacteria supplement (probiotic).  Taking medicine to relax your colon.  Taking antibiotic medicines.  Follow these instructions at home:  Drink 6-8 glasses of water or more each day to prevent constipation.  Try not to strain when you have a bowel movement.  If you have had an infection before: ? Eat more fiber as directed by your health care provider or your diet and nutrition specialist (dietitian). ? Take a fiber supplement or probiotic, if your health care provider approves.  Take over-the-counter and prescription medicines only as told by your health care provider.  If you were prescribed an antibiotic, take it as told by your health care provider. Do not stop taking the antibiotic even if you start to feel better.  Keep all follow-up visits as told by your health care provider. This is important. Contact a health care provider if:  You have pain in your abdomen.  You have bloating.  You have cramps.  You have not had a bowel movement in 3 days. Get help right away if:  Your pain gets  worse.  Your bloating becomes very bad.  You have a fever or chills, and your symptoms suddenly get worse.  You vomit.  You have bowel movements that are bloody or black.  You have bleeding from your rectum. Summary  Diverticulosis is a condition that develops when small pouches (diverticula) form in the wall of the large intestine (colon).  You may have a few pouches or many of them.  This condition is most often diagnosed during an exam for other colon problems.  If you have had an infection related to diverticulosis, treatment may include increasing the fiber in your diet, taking supplements, or taking medicines. This information is not intended to replace advice  given to you by your health care provider. Make sure you discuss any questions you have with your health care provider. Document Released: 01/01/2004 Document Revised: 02/23/2016 Document Reviewed: 02/23/2016 Elsevier Interactive Patient Education  2017 [place].  Colon Polyps Polyps are tissue growths inside the body. Polyps can grow in many places, including the large intestine (colon). A polyp may be a round bump or a mushroom-shaped growth. You could have one polyp or several. Most colon polyps are noncancerous (benign). However, some colon polyps can become cancerous over time. What are the causes? The exact cause of colon polyps is not known. What increases the risk? This condition is more likely to develop in people who:  Have a family history of colon cancer or colon polyps.  Are older than 77 or older than 45 if they are African American.  Have inflammatory bowel disease, such as ulcerative colitis or Crohn disease.  Are overweight.  Smoke cigarettes.  Do not get enough exercise.  Drink too much alcohol.  Eat a diet that is: ? High in fat and red meat. ? Low in fiber.  Had childhood cancer that was treated with abdominal radiation.  What are the signs or symptoms? Most polyps do not cause  symptoms. If you have symptoms, they may include:  Blood coming from your rectum when having a bowel movement.  Blood in your stool.The stool may look dark red or black.  A change in bowel habits, such as constipation or diarrhea.  How is this diagnosed? This condition is diagnosed with a colonoscopy. This is a procedure that uses a lighted, flexible scope to look at the inside of your colon. How is this treated? Treatment for this condition involves removing any polyps that are found. Those polyps will then be tested for cancer. If cancer is found, your health care provider will talk to you about options for colon cancer treatment. Follow these instructions at home: Diet  Eat plenty of fiber, such as fruits, vegetables, and whole grains.  Eat foods that are high in calcium and vitamin D, such as milk, cheese, yogurt, eggs, liver, fish, and broccoli.  Limit foods high in fat, red meats, and processed meats, such as hot dogs, sausage, bacon, and lunch meats.  Maintain a healthy weight, or lose weight if recommended by your health care provider. General instructions  Do not smoke cigarettes.  Do not drink alcohol excessively.  Keep all follow-up visits as told by your health care provider. This is important. This includes keeping regularly scheduled colonoscopies. Talk to your health care provider about when you need a colonoscopy.  Exercise every day or as told by your health care provider. Contact a health care provider if:  You have new or worsening bleeding during a bowel movement.  You have new or increased blood in your stool.  You have a change in bowel habits.  You unexpectedly lose weight. This information is not intended to replace advice given to you by your health care provider. Make sure you discuss any questions you have with your health care provider. Document Released: 12/31/2003 Document Revised: 09/11/2015 Document Reviewed: 02/24/2015 Elsevier Interactive  Patient Education  [employer].

## 2017-08-18 NOTE — Op Note (Signed)
[facility] Patient Name: Erin Cooke Procedure Date: 08/18/2017 9:35 AM MRN: [id number] Date of Birth: 02/04/1941 Attending MD: Hildred Laser , MD CSN: [id number] Age: 77 Admit Type: Outpatient Procedure:                Colonoscopy Indications:              High risk colon cancer surveillance: Personal                            history of colonic polyps, High risk colon cancer                            surveillance: Personal history of colon cancer Providers:                Hildred Laser, MD, Lurline Del, RN, Aram Candela Referring MD:             Cleora Fleet, MD Medicines:                Fentanyl 25 micrograms IV, Midazolam 5 mg IV Complications:            No immediate complications. Estimated Blood Loss:     Estimated blood loss was minimal. Procedure:                Pre-Anesthesia Assessment:                           - Prior to the procedure, a History and Physical                            was performed, and patient medications and                            allergies were reviewed. The patient's tolerance of                            previous anesthesia was also reviewed. The risks                            and benefits of the procedure and the sedation                            options and risks were discussed with the patient.                            All questions were answered, and informed consent                            was obtained. Prior Anticoagulants: The patient                            last took aspirin 3 days prior to the procedure.                            ASA Grade Assessment: II - A patient with mild  systemic disease. After reviewing the risks and                            benefits, the patient was deemed in satisfactory                            condition to undergo the procedure.                           After obtaining informed consent, the colonoscope                            was passed under direct  vision. Throughout the                            procedure, the patient's blood pressure, pulse, and                            oxygen saturations were monitored continuously. The                            EC-[id number] ([id number]) scope was introduced through                            the anus and advanced to the the ileocolonic                            anastomosis. The colonoscopy was performed without                            difficulty. The patient tolerated the procedure                            well. The quality of the bowel preparation was                            adequate. The rectum and ileocolonic anastamosis                            were photographed. Scope In: 10:01:31 AM Scope Out: 10:34:05 AM Scope Withdrawal Time: 0 hours 25 minutes 6 seconds  Total Procedure Duration: 0 hours 32 minutes 34 seconds  Findings:      The perianal and digital rectal examinations were normal.      There was evidence of a prior end-to-side ileo-colonic anastomosis at       the hepatic flexure. This was patent and was characterized by healthy       appearing mucosa.      A small polyp was found in the transverse colon. The polyp was sessile.       Biopsies were taken with a cold forceps for histology. The pathology       specimen was placed into Bottle Number 1.      Two flat polyps were found side by side in the splenic flexure. The       polyps were medium in size. These polyps were  removed with eleview       injection-lift technique using a hot snare. Resection and retrieval were       complete. The pathology specimen was placed into Bottle Number 2.      Scattered medium-mouthed diverticula were found in the sigmoid colon.      External hemorrhoids were found during retroflexion. The hemorrhoids       were small. Impression:               - Patent end-to-side ileo-colonic anastomosis,                            characterized by healthy appearing mucosa.                           -  One small polyp in the transverse colon. Biopsied.                           - Two medium polyps at the splenic flexure, removed                            using injection-lift and a hot snare. Resected and                            retrieved.                           - Diverticulosis in the sigmoid colon.                           - External hemorrhoids. Moderate Sedation:      Moderate (conscious) sedation was administered by the endoscopy nurse       and supervised by the endoscopist. The following parameters were       monitored: oxygen saturation, heart rate, blood pressure, CO2       capnography and response to care. Total physician intraservice time was       37 minutes. Recommendation:           - Patient has a contact number available for                            emergencies. The signs and symptoms of potential                            delayed complications were discussed with the                            patient. Return to normal activities tomorrow.                            Written discharge instructions were provided to the                            patient.                           - High fiber diet today.                           -  Continue present medications.                           - No aspirin, ibuprofen, naproxen, or other                            non-steroidal anti-inflammatory drugs for 7 days                            after polyp removal.                           - Await pathology results.                           - Repeat colonoscopy in 3 years for surveillance. Procedure Code(s):        --- Professional ---                           [phone], Colonoscopy, flexible; with removal of                            tumor(s), polyp(s), or other lesion(s) by snare                            technique                           45380, 59, Colonoscopy, flexible; with biopsy,                            single or multiple                           45381,  Colonoscopy, flexible; with directed                            submucosal injection(s), any substance                           G0500, Moderate sedation services provided by the                            same physician or other qualified health care                            professional performing a gastrointestinal                            endoscopic service that sedation supports,                            requiring the presence of an independent trained                            observer to assist in the monitoring of the  patient's level of consciousness and physiological                            status; initial 15 minutes of intra-service time;                            patient age 81 years or older (additional time may                            be reported with [phone], as appropriate)                           [phone], Moderate sedation services provided by the                            same physician or other qualified health care                            professional performing the diagnostic or                            therapeutic service that the sedation supports,                            requiring the presence of an independent trained                            observer to assist in the monitoring of the                            patient's level of consciousness and physiological                            status; each additional 15 minutes intraservice                            time (List separately in addition to code for                            primary service) Diagnosis Code(s):        --- Professional ---                           D12.3, Benign neoplasm of transverse colon (hepatic                            flexure or splenic flexure)                           Z86.010, Personal history of colonic polyps                           Z85.038, Personal history of other malignant  neoplasm of large  intestine                           Z98.0, Intestinal bypass and anastomosis status                           K64.4, Residual hemorrhoidal skin tags                           K57.30, Diverticulosis of large intestine without                            perforation or abscess without bleeding CPT copyright 2017 American Medical Association. All rights reserved. The codes documented in this report are preliminary and upon coder review may  be revised to meet current compliance requirements. Hildred Laser, MD Hildred Laser, MD 08/18/2017 10:46:28 AM This report has been signed electronically. Number of Addenda: 0

## 2017-08-22 DIAGNOSIS — M25571 Pain in right ankle and joints of right foot: Secondary | ICD-10-CM | POA: Diagnosis not present

## 2017-08-22 DIAGNOSIS — Z96661 Presence of right artificial ankle joint: Secondary | ICD-10-CM | POA: Diagnosis not present

## 2017-08-22 DIAGNOSIS — Z471 Aftercare following joint replacement surgery: Secondary | ICD-10-CM | POA: Diagnosis not present

## 2017-08-22 DIAGNOSIS — M25871 Other specified joint disorders, right ankle and foot: Secondary | ICD-10-CM | POA: Diagnosis not present

## 2017-08-23 ENCOUNTER — Encounter (HOSPITAL_COMMUNITY): Payer: Self-pay | Admitting: Internal Medicine

## 2017-08-23 ENCOUNTER — Telehealth (INDEPENDENT_AMBULATORY_CARE_PROVIDER_SITE_OTHER): Payer: Self-pay | Admitting: Internal Medicine

## 2017-08-23 NOTE — Telephone Encounter (Signed)
Dr. Rehman has talked with the patient. 

## 2017-08-23 NOTE — Telephone Encounter (Signed)
Patient returned Dr Olevia Perches phone call - ph# [phone]

## 2017-09-02 DIAGNOSIS — H47092 Other disorders of optic nerve, not elsewhere classified, left eye: Secondary | ICD-10-CM | POA: Diagnosis not present

## 2017-09-02 DIAGNOSIS — H47292 Other optic atrophy, left eye: Secondary | ICD-10-CM | POA: Diagnosis not present

## 2017-09-19 DIAGNOSIS — Z947 Corneal transplant status: Secondary | ICD-10-CM | POA: Diagnosis not present

## 2017-09-19 DIAGNOSIS — H04123 Dry eye syndrome of bilateral lacrimal glands: Secondary | ICD-10-CM | POA: Diagnosis not present

## 2017-09-19 DIAGNOSIS — Z961 Presence of intraocular lens: Secondary | ICD-10-CM | POA: Diagnosis not present

## 2017-09-19 DIAGNOSIS — H47292 Other optic atrophy, left eye: Secondary | ICD-10-CM | POA: Diagnosis not present

## 2017-09-19 DIAGNOSIS — H1851 Endothelial corneal dystrophy: Secondary | ICD-10-CM | POA: Diagnosis not present

## 2017-09-19 DIAGNOSIS — H40053 Ocular hypertension, bilateral: Secondary | ICD-10-CM | POA: Diagnosis not present

## 2017-09-29 DIAGNOSIS — I10 Essential (primary) hypertension: Secondary | ICD-10-CM | POA: Diagnosis not present

## 2017-09-29 DIAGNOSIS — M48062 Spinal stenosis, lumbar region with neurogenic claudication: Secondary | ICD-10-CM | POA: Diagnosis not present

## 2017-09-29 DIAGNOSIS — M48061 Spinal stenosis, lumbar region without neurogenic claudication: Secondary | ICD-10-CM | POA: Diagnosis not present

## 2017-09-30 ENCOUNTER — Other Ambulatory Visit: Payer: Self-pay | Admitting: Neurosurgery

## 2017-09-30 ENCOUNTER — Other Ambulatory Visit (HOSPITAL_COMMUNITY): Payer: Self-pay | Admitting: Neurosurgery

## 2017-09-30 DIAGNOSIS — M48061 Spinal stenosis, lumbar region without neurogenic claudication: Secondary | ICD-10-CM

## 2017-10-06 MED ORDER — SODIUM CHLORIDE 0.9 % IV SOLN: 4.0000 mg | Freq: Four times a day (QID) | INTRAVENOUS | Status: AC | PRN

## 2017-10-12 DIAGNOSIS — H47292 Other optic atrophy, left eye: Secondary | ICD-10-CM | POA: Diagnosis not present

## 2017-10-31 DIAGNOSIS — Z77128 Contact with and (suspected) exposure to other hazards in the physical environment: Secondary | ICD-10-CM | POA: Diagnosis not present

## 2017-10-31 DIAGNOSIS — Z9189 Other specified personal risk factors, not elsewhere classified: Secondary | ICD-10-CM | POA: Diagnosis not present

## 2017-11-07 ENCOUNTER — Other Ambulatory Visit: Payer: Self-pay

## 2017-11-07 ENCOUNTER — Ambulatory Visit (HOSPITAL_COMMUNITY)
Admission: RE | Admit: 2017-11-07 | Discharge: 2017-11-07 | Disposition: A | Discharge status: Home / Self Care | Payer: Medicare Other | Source: Ambulatory Visit | Attending: Neurosurgery | Admitting: Neurosurgery

## 2017-11-07 DIAGNOSIS — M48061 Spinal stenosis, lumbar region without neurogenic claudication: Secondary | ICD-10-CM

## 2017-11-07 MED ORDER — ONDANSETRON HCL 4 MG/2ML IJ SOLN: 4.0000 mg | Freq: Four times a day (QID) | INTRAMUSCULAR | Status: DC | PRN

## 2017-11-07 MED ORDER — OXYCODONE-ACETAMINOPHEN 5-325 MG PO TABS: 1.0000 | ORAL_TABLET | ORAL | Status: DC | PRN

## 2017-11-07 MED ORDER — IOPAMIDOL (ISOVUE-M 200) INJECTION 41%
20.0000 mL | Freq: Once | INTRAMUSCULAR | Status: AC
  Administered 2017-11-07: 20 mL via INTRATHECAL

## 2017-11-07 MED ORDER — LIDOCAINE HCL (PF) 1 % IJ SOLN
5.0000 mL | Freq: Once | INTRAMUSCULAR | Status: AC
  Administered 2017-11-07: 5 mL via INTRADERMAL

## 2017-11-07 MED ORDER — DIAZEPAM 5 MG PO TABS
10.0000 mg | ORAL_TABLET | Freq: Once | ORAL | Status: AC
  Administered 2017-11-07: 10 mg via ORAL

## 2017-11-07 MED ORDER — DIAZEPAM 5 MG PO TABS
ORAL_TABLET | ORAL | Status: AC
  Administered 2017-11-07: 10 mg via ORAL
  Filled 2017-11-07: qty 2

## 2017-11-07 NOTE — Op Note (Signed)
11/07/2017 lumbar Myelogram  PATIENT:  Erin Cooke is a 77 y.o. 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 female Was taken to the fluoroscopy suite and  positioned prone on the fluoroscopy table. Her back was prepared and draped in a sterile manner. I infiltrated 15 cc into the lumbar region. I then introduced a spinal needle into the thecal sac at the L4/5 interlaminar space. I infiltrated 15cc of Isovue 200 into the thecal sac. Fluoroscopy showed the needle and contrast in the thecal sac. [name] tolerated the procedure well. she Will be taken to CT for evaluation.     PATIENT DISPOSITION:  Short Stay

## 2017-11-07 NOTE — Discharge Instructions (Signed)
Myelogram, Care After °These instructions give you information about caring for yourself after your procedure. Your doctor may also give you more specific instructions. Call your doctor if you have any problems or questions after your procedure. °Follow these instructions at home: °· Drink enough fluid to keep your pee (urine) clear or pale yellow. °· Rest as told by your doctor. °· Lie flat with your head slightly raised (elevated). °· Do not bend, lift, or do any hard activities for 24-48 hours or as told by your doctor. °· Take over-the-counter and prescription medicines only as told by your doctor. °· Take care of and remove your bandage (dressing) as told by your doctor. °· Bathe or shower as told by your doctor. °Contact a health care provider if: °· You have a fever. °· You have a headache that lasts longer than 24 hours. °· You feel sick to your stomach (nauseous). °· You throw up (vomit). °· Your neck is stiff. °· Your legs feel numb. °· You cannot pee. °· You cannot poop (have a bowel movement). °· You have a rash. °· You are itchy or sneezing. °Get help right away if: °· You have new symptoms or your symptoms get worse. °· You have a seizure. °· You have trouble breathing. °This information is not intended to replace advice given to you by your health care provider. Make sure you discuss any questions you have with your health care provider. °Document Released: 01/13/2008 Document Revised: 12/04/2015 Document Reviewed: 01/16/2015 °Elsevier Interactive Patient Education © 2018 [employer] ° °

## 2017-11-10 DIAGNOSIS — M48062 Spinal stenosis, lumbar region with neurogenic claudication: Secondary | ICD-10-CM | POA: Diagnosis not present

## 2017-11-10 DIAGNOSIS — Z6838 Body mass index (BMI) 38.0-38.9, adult: Secondary | ICD-10-CM | POA: Diagnosis not present

## 2017-11-10 DIAGNOSIS — I10 Essential (primary) hypertension: Secondary | ICD-10-CM | POA: Diagnosis not present

## 2017-11-23 DIAGNOSIS — Z85038 Personal history of other malignant neoplasm of large intestine: Secondary | ICD-10-CM | POA: Diagnosis not present

## 2017-11-23 DIAGNOSIS — E785 Hyperlipidemia, unspecified: Secondary | ICD-10-CM | POA: Diagnosis not present

## 2017-11-23 DIAGNOSIS — Z888 Allergy status to other drugs, medicaments and biological substances status: Secondary | ICD-10-CM | POA: Diagnosis not present

## 2017-11-23 DIAGNOSIS — Z853 Personal history of malignant neoplasm of breast: Secondary | ICD-10-CM | POA: Diagnosis not present

## 2017-11-23 DIAGNOSIS — D509 Iron deficiency anemia, unspecified: Secondary | ICD-10-CM | POA: Diagnosis not present

## 2017-11-23 DIAGNOSIS — Z808 Family history of malignant neoplasm of other organs or systems: Secondary | ICD-10-CM | POA: Diagnosis not present

## 2017-11-23 DIAGNOSIS — Z8049 Family history of malignant neoplasm of other genital organs: Secondary | ICD-10-CM | POA: Diagnosis not present

## 2017-11-23 DIAGNOSIS — Z7982 Long term (current) use of aspirin: Secondary | ICD-10-CM | POA: Diagnosis not present

## 2017-11-23 DIAGNOSIS — Z79899 Other long term (current) drug therapy: Secondary | ICD-10-CM | POA: Diagnosis not present

## 2017-11-23 DIAGNOSIS — Z8041 Family history of malignant neoplasm of ovary: Secondary | ICD-10-CM | POA: Diagnosis not present

## 2017-11-23 DIAGNOSIS — Z803 Family history of malignant neoplasm of breast: Secondary | ICD-10-CM | POA: Diagnosis not present

## 2017-11-23 DIAGNOSIS — Z885 Allergy status to narcotic agent status: Secondary | ICD-10-CM | POA: Diagnosis not present

## 2017-11-23 DIAGNOSIS — Z08 Encounter for follow-up examination after completed treatment for malignant neoplasm: Secondary | ICD-10-CM | POA: Diagnosis not present

## 2017-11-23 DIAGNOSIS — I509 Heart failure, unspecified: Secondary | ICD-10-CM | POA: Diagnosis not present

## 2017-11-29 DIAGNOSIS — M48062 Spinal stenosis, lumbar region with neurogenic claudication: Secondary | ICD-10-CM | POA: Diagnosis not present

## 2017-11-30 DIAGNOSIS — Z853 Personal history of malignant neoplasm of breast: Secondary | ICD-10-CM | POA: Diagnosis not present

## 2017-11-30 DIAGNOSIS — C182 Malignant neoplasm of ascending colon: Secondary | ICD-10-CM | POA: Diagnosis not present

## 2017-12-01 DIAGNOSIS — C182 Malignant neoplasm of ascending colon: Secondary | ICD-10-CM | POA: Diagnosis not present

## 2017-12-01 DIAGNOSIS — I2510 Atherosclerotic heart disease of native coronary artery without angina pectoris: Secondary | ICD-10-CM | POA: Diagnosis not present

## 2017-12-01 DIAGNOSIS — I429 Cardiomyopathy, unspecified: Secondary | ICD-10-CM | POA: Diagnosis not present

## 2017-12-01 DIAGNOSIS — E785 Hyperlipidemia, unspecified: Secondary | ICD-10-CM | POA: Diagnosis not present

## 2017-12-01 DIAGNOSIS — I340 Nonrheumatic mitral (valve) insufficiency: Secondary | ICD-10-CM | POA: Diagnosis not present

## 2017-12-07 DIAGNOSIS — C182 Malignant neoplasm of ascending colon: Secondary | ICD-10-CM | POA: Diagnosis not present

## 2017-12-07 DIAGNOSIS — K824 Cholesterolosis of gallbladder: Secondary | ICD-10-CM | POA: Diagnosis not present

## 2017-12-23 ENCOUNTER — Encounter (INDEPENDENT_AMBULATORY_CARE_PROVIDER_SITE_OTHER): Payer: Self-pay | Admitting: *Deleted

## 2017-12-26 DIAGNOSIS — X32XXXD Exposure to sunlight, subsequent encounter: Secondary | ICD-10-CM | POA: Diagnosis not present

## 2017-12-26 DIAGNOSIS — C44629 Squamous cell carcinoma of skin of left upper limb, including shoulder: Secondary | ICD-10-CM | POA: Diagnosis not present

## 2017-12-26 DIAGNOSIS — L820 Inflamed seborrheic keratosis: Secondary | ICD-10-CM | POA: Diagnosis not present

## 2017-12-26 DIAGNOSIS — L570 Actinic keratosis: Secondary | ICD-10-CM | POA: Diagnosis not present

## 2017-12-27 DIAGNOSIS — M48062 Spinal stenosis, lumbar region with neurogenic claudication: Secondary | ICD-10-CM | POA: Diagnosis not present

## 2018-01-23 DIAGNOSIS — Z08 Encounter for follow-up examination after completed treatment for malignant neoplasm: Secondary | ICD-10-CM | POA: Diagnosis not present

## 2018-01-23 DIAGNOSIS — Z85828 Personal history of other malignant neoplasm of skin: Secondary | ICD-10-CM | POA: Diagnosis not present

## 2018-01-24 DIAGNOSIS — R030 Elevated blood-pressure reading, without diagnosis of hypertension: Secondary | ICD-10-CM | POA: Diagnosis not present

## 2018-01-24 DIAGNOSIS — M48062 Spinal stenosis, lumbar region with neurogenic claudication: Secondary | ICD-10-CM | POA: Diagnosis not present

## 2018-01-24 DIAGNOSIS — Z6838 Body mass index (BMI) 38.0-38.9, adult: Secondary | ICD-10-CM | POA: Diagnosis not present

## 2018-02-01 DIAGNOSIS — H1851 Endothelial corneal dystrophy: Secondary | ICD-10-CM | POA: Diagnosis not present

## 2018-02-02 DIAGNOSIS — Z96661 Presence of right artificial ankle joint: Secondary | ICD-10-CM | POA: Diagnosis not present

## 2018-02-02 DIAGNOSIS — Z23 Encounter for immunization: Secondary | ICD-10-CM | POA: Diagnosis not present

## 2018-02-02 DIAGNOSIS — Z471 Aftercare following joint replacement surgery: Secondary | ICD-10-CM | POA: Diagnosis not present

## 2018-02-08 DIAGNOSIS — H53131 Sudden visual loss, right eye: Secondary | ICD-10-CM | POA: Diagnosis not present

## 2018-02-10 DIAGNOSIS — I429 Cardiomyopathy, unspecified: Secondary | ICD-10-CM | POA: Diagnosis not present

## 2018-02-10 DIAGNOSIS — I340 Nonrheumatic mitral (valve) insufficiency: Secondary | ICD-10-CM | POA: Diagnosis not present

## 2018-02-10 DIAGNOSIS — I2510 Atherosclerotic heart disease of native coronary artery without angina pectoris: Secondary | ICD-10-CM | POA: Diagnosis not present

## 2018-02-10 DIAGNOSIS — C182 Malignant neoplasm of ascending colon: Secondary | ICD-10-CM | POA: Diagnosis not present

## 2018-02-13 DIAGNOSIS — I10 Essential (primary) hypertension: Secondary | ICD-10-CM | POA: Diagnosis not present

## 2018-02-13 DIAGNOSIS — Z6838 Body mass index (BMI) 38.0-38.9, adult: Secondary | ICD-10-CM | POA: Diagnosis not present

## 2018-02-13 DIAGNOSIS — M48062 Spinal stenosis, lumbar region with neurogenic claudication: Secondary | ICD-10-CM | POA: Diagnosis not present

## 2018-02-15 DIAGNOSIS — H47292 Other optic atrophy, left eye: Secondary | ICD-10-CM | POA: Diagnosis not present

## 2018-02-15 DIAGNOSIS — H0288B Meibomian gland dysfunction left eye, upper and lower eyelids: Secondary | ICD-10-CM | POA: Diagnosis not present

## 2018-02-15 DIAGNOSIS — H0288A Meibomian gland dysfunction right eye, upper and lower eyelids: Secondary | ICD-10-CM | POA: Diagnosis not present

## 2018-02-15 DIAGNOSIS — Z947 Corneal transplant status: Secondary | ICD-10-CM | POA: Diagnosis not present

## 2018-02-15 DIAGNOSIS — H53121 Transient visual loss, right eye: Secondary | ICD-10-CM | POA: Diagnosis not present

## 2018-02-15 DIAGNOSIS — H04123 Dry eye syndrome of bilateral lacrimal glands: Secondary | ICD-10-CM | POA: Diagnosis not present

## 2018-02-15 DIAGNOSIS — H1851 Endothelial corneal dystrophy: Secondary | ICD-10-CM | POA: Diagnosis not present

## 2018-02-15 DIAGNOSIS — Z961 Presence of intraocular lens: Secondary | ICD-10-CM | POA: Diagnosis not present

## 2018-02-17 DIAGNOSIS — H53121 Transient visual loss, right eye: Secondary | ICD-10-CM | POA: Diagnosis not present

## 2018-03-29 DIAGNOSIS — H47292 Other optic atrophy, left eye: Secondary | ICD-10-CM | POA: Diagnosis not present

## 2018-03-29 DIAGNOSIS — H0288B Meibomian gland dysfunction left eye, upper and lower eyelids: Secondary | ICD-10-CM | POA: Diagnosis not present

## 2018-03-29 DIAGNOSIS — H04123 Dry eye syndrome of bilateral lacrimal glands: Secondary | ICD-10-CM | POA: Diagnosis not present

## 2018-03-29 DIAGNOSIS — Z947 Corneal transplant status: Secondary | ICD-10-CM | POA: Diagnosis not present

## 2018-03-29 DIAGNOSIS — Z961 Presence of intraocular lens: Secondary | ICD-10-CM | POA: Diagnosis not present

## 2018-03-29 DIAGNOSIS — H0288A Meibomian gland dysfunction right eye, upper and lower eyelids: Secondary | ICD-10-CM | POA: Diagnosis not present

## 2018-03-29 DIAGNOSIS — H1851 Endothelial corneal dystrophy: Secondary | ICD-10-CM | POA: Diagnosis not present

## 2018-04-05 DIAGNOSIS — H47292 Other optic atrophy, left eye: Secondary | ICD-10-CM | POA: Diagnosis not present

## 2018-04-24 DIAGNOSIS — D225 Melanocytic nevi of trunk: Secondary | ICD-10-CM | POA: Diagnosis not present

## 2018-04-24 DIAGNOSIS — Z1283 Encounter for screening for malignant neoplasm of skin: Secondary | ICD-10-CM | POA: Diagnosis not present

## 2018-05-03 DIAGNOSIS — H6123 Impacted cerumen, bilateral: Secondary | ICD-10-CM | POA: Diagnosis not present

## 2018-05-23 DIAGNOSIS — I509 Heart failure, unspecified: Secondary | ICD-10-CM | POA: Diagnosis not present

## 2018-05-23 DIAGNOSIS — Z7982 Long term (current) use of aspirin: Secondary | ICD-10-CM | POA: Diagnosis not present

## 2018-05-23 DIAGNOSIS — E785 Hyperlipidemia, unspecified: Secondary | ICD-10-CM | POA: Diagnosis not present

## 2018-05-23 DIAGNOSIS — Z85038 Personal history of other malignant neoplasm of large intestine: Secondary | ICD-10-CM | POA: Diagnosis not present

## 2018-05-23 DIAGNOSIS — Z79899 Other long term (current) drug therapy: Secondary | ICD-10-CM | POA: Diagnosis not present

## 2018-05-23 DIAGNOSIS — Z08 Encounter for follow-up examination after completed treatment for malignant neoplasm: Secondary | ICD-10-CM | POA: Diagnosis not present

## 2018-05-23 DIAGNOSIS — Z885 Allergy status to narcotic agent status: Secondary | ICD-10-CM | POA: Diagnosis not present

## 2018-05-23 DIAGNOSIS — Z853 Personal history of malignant neoplasm of breast: Secondary | ICD-10-CM | POA: Diagnosis not present

## 2018-05-23 DIAGNOSIS — Z888 Allergy status to other drugs, medicaments and biological substances status: Secondary | ICD-10-CM | POA: Diagnosis not present

## 2018-05-23 DIAGNOSIS — D509 Iron deficiency anemia, unspecified: Secondary | ICD-10-CM | POA: Diagnosis not present

## 2018-05-24 DIAGNOSIS — H47292 Other optic atrophy, left eye: Secondary | ICD-10-CM | POA: Diagnosis not present

## 2018-05-24 DIAGNOSIS — H1851 Endothelial corneal dystrophy: Secondary | ICD-10-CM | POA: Diagnosis not present

## 2018-05-30 DIAGNOSIS — Z853 Personal history of malignant neoplasm of breast: Secondary | ICD-10-CM | POA: Diagnosis not present

## 2018-05-30 DIAGNOSIS — C182 Malignant neoplasm of ascending colon: Secondary | ICD-10-CM | POA: Diagnosis not present

## 2018-06-14 DIAGNOSIS — I2510 Atherosclerotic heart disease of native coronary artery without angina pectoris: Secondary | ICD-10-CM | POA: Diagnosis not present

## 2018-06-14 DIAGNOSIS — C182 Malignant neoplasm of ascending colon: Secondary | ICD-10-CM | POA: Diagnosis not present

## 2018-06-14 DIAGNOSIS — I429 Cardiomyopathy, unspecified: Secondary | ICD-10-CM | POA: Diagnosis not present

## 2018-06-14 DIAGNOSIS — N289 Disorder of kidney and ureter, unspecified: Secondary | ICD-10-CM | POA: Diagnosis not present

## 2018-06-14 DIAGNOSIS — I340 Nonrheumatic mitral (valve) insufficiency: Secondary | ICD-10-CM | POA: Diagnosis not present

## 2018-06-14 DIAGNOSIS — E785 Hyperlipidemia, unspecified: Secondary | ICD-10-CM | POA: Diagnosis not present

## 2018-06-20 DIAGNOSIS — Z1231 Encounter for screening mammogram for malignant neoplasm of breast: Secondary | ICD-10-CM | POA: Diagnosis not present

## 2018-06-20 DIAGNOSIS — Z853 Personal history of malignant neoplasm of breast: Secondary | ICD-10-CM | POA: Diagnosis not present

## 2018-07-19 DIAGNOSIS — Z961 Presence of intraocular lens: Secondary | ICD-10-CM | POA: Diagnosis not present

## 2018-07-19 DIAGNOSIS — Z947 Corneal transplant status: Secondary | ICD-10-CM | POA: Diagnosis not present

## 2018-07-19 DIAGNOSIS — H18231 Secondary corneal edema, right eye: Secondary | ICD-10-CM | POA: Diagnosis not present

## 2018-07-28 DIAGNOSIS — Z947 Corneal transplant status: Secondary | ICD-10-CM | POA: Diagnosis not present

## 2018-07-28 DIAGNOSIS — H18231 Secondary corneal edema, right eye: Secondary | ICD-10-CM | POA: Diagnosis not present

## 2018-08-11 DIAGNOSIS — H18231 Secondary corneal edema, right eye: Secondary | ICD-10-CM | POA: Diagnosis not present

## 2018-08-11 DIAGNOSIS — Z961 Presence of intraocular lens: Secondary | ICD-10-CM | POA: Diagnosis not present

## 2018-08-11 DIAGNOSIS — H47292 Other optic atrophy, left eye: Secondary | ICD-10-CM | POA: Diagnosis not present

## 2018-09-13 DIAGNOSIS — Z1159 Encounter for screening for other viral diseases: Secondary | ICD-10-CM | POA: Diagnosis not present

## 2018-09-13 DIAGNOSIS — Z01818 Encounter for other preprocedural examination: Secondary | ICD-10-CM | POA: Diagnosis not present

## 2018-09-15 DIAGNOSIS — E785 Hyperlipidemia, unspecified: Secondary | ICD-10-CM | POA: Diagnosis not present

## 2018-09-15 DIAGNOSIS — Z87891 Personal history of nicotine dependence: Secondary | ICD-10-CM | POA: Diagnosis not present

## 2018-09-15 DIAGNOSIS — I2510 Atherosclerotic heart disease of native coronary artery without angina pectoris: Secondary | ICD-10-CM | POA: Diagnosis not present

## 2018-09-15 DIAGNOSIS — H1851 Endothelial corneal dystrophy: Secondary | ICD-10-CM | POA: Diagnosis not present

## 2018-09-15 DIAGNOSIS — K219 Gastro-esophageal reflux disease without esophagitis: Secondary | ICD-10-CM | POA: Diagnosis not present

## 2018-09-15 DIAGNOSIS — T86841 Corneal transplant failure: Secondary | ICD-10-CM | POA: Diagnosis not present

## 2018-11-02 DIAGNOSIS — N904 Leukoplakia of vulva: Secondary | ICD-10-CM | POA: Diagnosis not present

## 2018-11-02 DIAGNOSIS — Z9189 Other specified personal risk factors, not elsewhere classified: Secondary | ICD-10-CM | POA: Diagnosis not present

## 2018-11-02 DIAGNOSIS — C50911 Malignant neoplasm of unspecified site of right female breast: Secondary | ICD-10-CM | POA: Diagnosis not present

## 2018-11-02 DIAGNOSIS — Z77128 Contact with and (suspected) exposure to other hazards in the physical environment: Secondary | ICD-10-CM | POA: Diagnosis not present

## 2018-11-16 DIAGNOSIS — M48062 Spinal stenosis, lumbar region with neurogenic claudication: Secondary | ICD-10-CM | POA: Diagnosis not present

## 2018-11-23 DIAGNOSIS — R635 Abnormal weight gain: Secondary | ICD-10-CM | POA: Diagnosis not present

## 2018-11-23 DIAGNOSIS — Z853 Personal history of malignant neoplasm of breast: Secondary | ICD-10-CM | POA: Diagnosis not present

## 2018-11-23 DIAGNOSIS — C182 Malignant neoplasm of ascending colon: Secondary | ICD-10-CM | POA: Diagnosis not present

## 2018-11-23 DIAGNOSIS — D509 Iron deficiency anemia, unspecified: Secondary | ICD-10-CM | POA: Diagnosis not present

## 2018-11-28 DIAGNOSIS — M48062 Spinal stenosis, lumbar region with neurogenic claudication: Secondary | ICD-10-CM | POA: Diagnosis not present

## 2018-11-30 DIAGNOSIS — M48062 Spinal stenosis, lumbar region with neurogenic claudication: Secondary | ICD-10-CM | POA: Diagnosis not present

## 2018-12-05 ENCOUNTER — Other Ambulatory Visit: Payer: Self-pay | Admitting: Neurosurgery

## 2018-12-05 DIAGNOSIS — Z853 Personal history of malignant neoplasm of breast: Secondary | ICD-10-CM | POA: Diagnosis not present

## 2018-12-05 DIAGNOSIS — C182 Malignant neoplasm of ascending colon: Secondary | ICD-10-CM | POA: Diagnosis not present

## 2018-12-14 DIAGNOSIS — C182 Malignant neoplasm of ascending colon: Secondary | ICD-10-CM | POA: Diagnosis not present

## 2018-12-14 DIAGNOSIS — I2510 Atherosclerotic heart disease of native coronary artery without angina pectoris: Secondary | ICD-10-CM | POA: Diagnosis not present

## 2018-12-14 DIAGNOSIS — E785 Hyperlipidemia, unspecified: Secondary | ICD-10-CM | POA: Diagnosis not present

## 2018-12-14 DIAGNOSIS — I340 Nonrheumatic mitral (valve) insufficiency: Secondary | ICD-10-CM | POA: Diagnosis not present

## 2018-12-14 DIAGNOSIS — I429 Cardiomyopathy, unspecified: Secondary | ICD-10-CM | POA: Diagnosis not present

## 2018-12-18 DIAGNOSIS — H04123 Dry eye syndrome of bilateral lacrimal glands: Secondary | ICD-10-CM | POA: Diagnosis not present

## 2018-12-18 DIAGNOSIS — Z9889 Other specified postprocedural states: Secondary | ICD-10-CM | POA: Diagnosis not present

## 2018-12-18 DIAGNOSIS — Z947 Corneal transplant status: Secondary | ICD-10-CM | POA: Diagnosis not present

## 2018-12-19 ENCOUNTER — Ambulatory Visit (INDEPENDENT_AMBULATORY_CARE_PROVIDER_SITE_OTHER): Payer: Medicare Other | Admitting: Nurse Practitioner

## 2018-12-19 ENCOUNTER — Other Ambulatory Visit: Payer: Self-pay

## 2018-12-19 ENCOUNTER — Encounter (INDEPENDENT_AMBULATORY_CARE_PROVIDER_SITE_OTHER): Payer: Self-pay | Admitting: *Deleted

## 2018-12-19 ENCOUNTER — Encounter (INDEPENDENT_AMBULATORY_CARE_PROVIDER_SITE_OTHER): Payer: Self-pay | Admitting: Nurse Practitioner

## 2018-12-19 VITALS — BP 144/77 | HR 66 | Temp 98.1°F | Ht 61.0 in | Wt 206.3 lb

## 2018-12-19 DIAGNOSIS — R112 Nausea with vomiting, unspecified: Secondary | ICD-10-CM | POA: Insufficient documentation

## 2018-12-19 DIAGNOSIS — R1011 Right upper quadrant pain: Secondary | ICD-10-CM | POA: Insufficient documentation

## 2018-12-19 NOTE — Progress Notes (Addendum)
Subjective:    Patient ID: Erin Cooke, female    DOB: 11-28-1940, 78 y.o.   MRN: [id number]    Patient with a significant past medical history of CAD, MI, coronary stent x 2, CHF, breast cancer s/p lumpectomy 2013, pulmonary nodule, GERD, microscopic colitis, colon cancer s/p right hemicolectomy 2017. She presents today with complaints of having chronic nausea for the past 2 years. Rarely vomits. She developed RUQ pain 4 weeks ago. She awakens between 4 - 5 am every day with RUQ pain that radiates to her right upper back. No worsening nausea when having RUQ pain. No heartburn or dysphagia. She takes ASA 23m daily, no other NSAIDs. On Omeprazole 267mQD. No fever, sweats or chills. The RUQ pain lasts for an hour or two. Nausea is worse in the later afternoon and evening. No chest pain or SOB. She has frequent diarrhea since she had her right hemicolectomy. She typically has 1 or 2 episodes of mud to water diarrhea for 2 to 3 days then no BM for a few days then passes a solid stool for 1 or 2 days then the cycle repeats. No recent antibiotics. No rectal bleeding or melena. She takes 2 Imodium tabs in the morning if she needs to leave her house, sometimes will take a 3rd Imodium. No weight loss. Her husband died 8 years ago. Her creatinine level has recently increased to 1.39 which is being followed by her oncologist, Dr. 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 She is scheduled for a spinal fusion surgery on 01/17/2019. She has an appointment to see her cardiologist prior to this surgery.  Labs 11/23/2018: WBC 8.8. Hg 13.4. HCT 43.1. PLT 237. BUN 23. Cr. 1.39. Albumin 3.1. Alk phos 119 (45-117). AST 20. ALT 24. CEA 4.6 (CEA  4.2 on 11/30/17).  Her most recent colonoscopy was 08/18/2017:  - Patent end-to-side ileo-colonic anastomosis, characterized by healthy appearing mucosa. - One small hyperplastic polyp in the transverse colon.  - Two medium sessile serrated  polyps at the splenic flexure - Diverticulosis in the sigmoid colon. - External hemorrhoids -Recall colonoscopy in 3 yrs  Abdominal ultrasound 06/15/2017 showed a small gallbladder stone verses polyp, hepatic steatosis and bilateral cortical thinning of both kidneys  Past Medical History:  Diagnosis Date   Arthritis    Cancer ([facility]   breast   CHF (congestive heart failure) (HCC)    Chronic back pain    synovial cyst,radiculopathy,and stenosis   Colon cancer (HCC)    Coronary artery disease    Dyspnea    Erosive esophagitis    GERD (gastroesophageal reflux disease)    takes OTC Prilosec daily   History of colon polyps    Hyperlipidemia    takes Atorvastatin daily   Microscopic colitis    [place]   Palpitations    takes betapace daily   PONV (postoperative nausea and vomiting)    states Forane elevated her liver enzymes   Stress incontinence    Thyroid nodule    benign   Past Surgical History:  Procedure Laterality Date   ANKLE SURGERY Right 1990   pins and plate   APPENDECTOMY     BACK SURGERY  2011   BIOPSY THYROID     CARDIAC CATHETERIZATION  1989/1999   CARPAL TUNNEL RELEASE Right 2004   cataract surgery with corneal transplant  Right 2008   cataract surgery with corneal transplant  Left 2014  COLON SURGERY  2017   COLONOSCOPY     COLONOSCOPY N/A 05/01/2015   Procedure: COLONOSCOPY;  Surgeon: Rogene Houston, MD;  [facility];  Service: Endoscopy;  Laterality: N/A;   COLONOSCOPY N/A 07/28/2016   Procedure: COLONOSCOPY;  Surgeon: Rogene Houston, MD;  [facility];  Service: Endoscopy;  Laterality: N/A;  830 - moved to 4/11 @ 12:00   COLONOSCOPY N/A 08/18/2017   Procedure: COLONOSCOPY;  Surgeon: Rogene Houston, MD;  [facility];  Service: Endoscopy;  Laterality: N/A;  8   CORONARY ANGIOPLASTY     2 stents   DILATION AND CURETTAGE OF UTERUS  2014    ESOPHAGOGASTRODUODENOSCOPY     ESOPHAGOGASTRODUODENOSCOPY N/A 07/28/2016   Procedure: ESOPHAGOGASTRODUODENOSCOPY (EGD);  Surgeon: Rogene Houston, MD;  [facility];  Service: Endoscopy;  Laterality: N/A;   eye lid surgery Bilateral 2014   MASTECTOMY, PARTIAL Right 2013   POLYPECTOMY  08/18/2017   Procedure: POLYPECTOMY;  Surgeon: Rogene Houston, MD;  [facility];  Service: Endoscopy;;  transverse   ROTATOR CUFF REPAIR Left 1989   [place] Right 2001   SPINAL FUSION  2012   TOTAL ANKLE REPLACEMENT Right 2012   Current Outpatient Medications on File Prior to Visit  Medication Sig Dispense Refill   acetaminophen (TYLENOL) 500 MG tablet Take 1,000 mg by mouth 2 (two) times daily as needed for moderate pain or headache.     aspirin EC 81 MG tablet Take 1 tablet (81 mg total) by mouth daily.     atorvastatin (LIPITOR) 80 MG tablet Take 80 mg by mouth every evening.      Biotin 10000 MCG TABS Take 10,000 mcg by mouth daily.     furosemide (LASIX) 20 MG tablet Take 20 mg by mouth daily.      loperamide (IMODIUM) 2 MG capsule Take 2 mg by mouth as needed for diarrhea or loose stools.     losartan (COZAAR) 25 MG tablet Take 25 mg by mouth daily.     metoprolol (LOPRESSOR) 100 MG tablet Take 100 mg by mouth 2 (two) times daily.     Multiple Vitamin (MULTIVITAMIN WITH MINERALS) TABS tablet Take 1 tablet by mouth daily.     nitroGLYCERIN (NITROSTAT) 0.4 MG SL tablet Place 0.4 mg under the tongue every 5 (five) minutes as needed for chest pain.     omeprazole (PRILOSEC OTC) 20 MG tablet Take 20 mg by mouth every morning.     Polyethyl Glycol-Propyl Glycol (SYSTANE OP) Place 1 drop into both eyes 3 (three) times daily as needed (for dry eyes).      prednisoLONE acetate (PRED FORTE) 1 % ophthalmic suspension Place 1 drop into both eyes daily.     Probiotic Product (PHILLIPS COLON HEALTH PO) Take 1 capsule by mouth daily.     Current  Facility-Administered Medications on File Prior to Visit  Medication Dose Route Frequency Provider Last Rate Last Dose   ondansetron (ZOFRAN) 4 mg in sodium chloride 0.9 % 50 mL IVPB  4 mg Intravenous Q6H PRN Ashok Pall, MD       Allergies  Allergen Reactions   Demerol [Meperidine Hcl] Nausea And Vomiting   Forane [Isoflurane] Other (See Comments)    Elevated liver enzymes   Letrozole Other (See Comments)    Muscle pain   Review of Systems See HPI, all other systems reviewed and are negative     Objective:   Physical  Exam  Blood pressure (!) 144/77, pulse 66, temperature 98.1 F (36.7 C), height 5' 1"  (1.549 m), weight 206 lb 4.8 oz (93.6 kg).  General: 78 year old female well developed in NAD Eyes: sclera nonicteric, conjunctiva pink Mouth: no ulcers or lesions Neck: supple, no thyromegaly or lymphadenopathy  Heart: RRR, soft systolic murmur Lungs: clear throughout Abdomen: soft, nontender, no masses or organomegaly, midline scar intact Extremities: trace edema right ankle Neuro: alert and oriented x 4, no focal deficits     Assessment & Plan:   73. 78 year old female with chronic nausea, RUQ pain which awakens patient from sleep 4-5am which is suggestive of biliary etiology. Rule out gallstones, chronic cholecystitis, gallbladder dyskenesia -abdominal sonogram  -If abdominal sonogram negative will then order CCK HIDA  -check CBC, CMP and lipase if sx worsen  - Zofran 12m Q 8 PRN -Continue Omeprazole 271monce daily. Add Pepcid 204mne tab at bed time  -avoid fatty foods -discussed EGD if the above evaluation negative   2. Hx of colon cancer s/p right hemicolectomy in 2017, hx microscopic colitis with IBS sx -Probiotic of choice daily -Benefiber 1 tbsp daily if tolerated -Imodium 1 tab po bid as needed -check stool cx if diarrhea occurs daily    3. Hx CAD, MI, Stents, CHF  4. Renal insufficiency with rising creatinine level -recommend nephrology evaluation

## 2018-12-19 NOTE — Patient Instructions (Signed)
1.  Schedule an abdominal ultrasound to evaluate the gallbladder 2.  Continue omeprazole 20 mg 1 capsule in the morning.  Add Pepcid 20 mg 1 tablet at bedtime. 3.  Benefiber 1 tablespoon once daily to regulate bowel pattern 4.  Imodium 1 tab twice daily as needed 5.  If abdominal ultrasound negative will order a CCK HIDA scan 6.  If no improvement with the above evaluation will consider upper endoscopy for further evaluation

## 2018-12-22 ENCOUNTER — Ambulatory Visit (HOSPITAL_COMMUNITY)
Admission: RE | Admit: 2018-12-22 | Discharge: 2018-12-22 | Disposition: A | Discharge status: Home / Self Care | Payer: Medicare Other | Source: Ambulatory Visit | Attending: Nurse Practitioner | Admitting: Nurse Practitioner

## 2018-12-22 ENCOUNTER — Other Ambulatory Visit: Payer: Self-pay

## 2018-12-22 DIAGNOSIS — R1011 Right upper quadrant pain: Secondary | ICD-10-CM | POA: Diagnosis not present

## 2018-12-27 ENCOUNTER — Telehealth (INDEPENDENT_AMBULATORY_CARE_PROVIDER_SITE_OTHER): Payer: Self-pay | Admitting: Nurse Practitioner

## 2018-12-27 NOTE — Telephone Encounter (Signed)
Patient called regarding test results - ph# [phone]

## 2018-12-28 NOTE — Telephone Encounter (Signed)
I called patient, she is feeling much better since taking Pepcid at night time and benefiber in the morning. No RUQ pain that awakens her at night. However, she feels pain to the right rib cage at times which she feels is bone pain. I advised she follow up with her pcp to evaluate her rib cage, ?bone scan. Defer CCK HIDA for now. Stools have firmed up since taking the Benefiber. No further diarrhea. She will call me in 2 weeks with further update.

## 2019-01-04 DIAGNOSIS — Z853 Personal history of malignant neoplasm of breast: Secondary | ICD-10-CM | POA: Diagnosis not present

## 2019-01-04 DIAGNOSIS — M898X9 Other specified disorders of bone, unspecified site: Secondary | ICD-10-CM | POA: Diagnosis not present

## 2019-01-04 DIAGNOSIS — C182 Malignant neoplasm of ascending colon: Secondary | ICD-10-CM | POA: Diagnosis not present

## 2019-01-12 ENCOUNTER — Other Ambulatory Visit: Payer: Self-pay

## 2019-01-12 ENCOUNTER — Encounter (HOSPITAL_COMMUNITY)
Admission: RE | Admit: 2019-01-12 | Discharge: 2019-01-12 | Disposition: A | Discharge status: Home / Self Care | Payer: Medicare Other | Source: Ambulatory Visit | Attending: Neurosurgery | Admitting: Neurosurgery

## 2019-01-12 ENCOUNTER — Encounter (HOSPITAL_COMMUNITY): Payer: Self-pay

## 2019-01-12 DIAGNOSIS — N189 Chronic kidney disease, unspecified: Secondary | ICD-10-CM | POA: Diagnosis not present

## 2019-01-12 DIAGNOSIS — I2582 Chronic total occlusion of coronary artery: Secondary | ICD-10-CM | POA: Insufficient documentation

## 2019-01-12 DIAGNOSIS — Z87891 Personal history of nicotine dependence: Secondary | ICD-10-CM | POA: Insufficient documentation

## 2019-01-12 DIAGNOSIS — M48062 Spinal stenosis, lumbar region with neurogenic claudication: Secondary | ICD-10-CM | POA: Insufficient documentation

## 2019-01-12 DIAGNOSIS — I700 Atherosclerosis of aorta: Secondary | ICD-10-CM | POA: Diagnosis not present

## 2019-01-12 DIAGNOSIS — M1990 Unspecified osteoarthritis, unspecified site: Secondary | ICD-10-CM | POA: Diagnosis not present

## 2019-01-12 DIAGNOSIS — K760 Fatty (change of) liver, not elsewhere classified: Secondary | ICD-10-CM | POA: Insufficient documentation

## 2019-01-12 DIAGNOSIS — K219 Gastro-esophageal reflux disease without esophagitis: Secondary | ICD-10-CM | POA: Insufficient documentation

## 2019-01-12 DIAGNOSIS — Z01812 Encounter for preprocedural laboratory examination: Secondary | ICD-10-CM | POA: Diagnosis not present

## 2019-01-12 DIAGNOSIS — E785 Hyperlipidemia, unspecified: Secondary | ICD-10-CM | POA: Insufficient documentation

## 2019-01-12 DIAGNOSIS — I255 Ischemic cardiomyopathy: Secondary | ICD-10-CM | POA: Insufficient documentation

## 2019-01-12 DIAGNOSIS — I252 Old myocardial infarction: Secondary | ICD-10-CM | POA: Insufficient documentation

## 2019-01-12 DIAGNOSIS — I2510 Atherosclerotic heart disease of native coronary artery without angina pectoris: Secondary | ICD-10-CM | POA: Insufficient documentation

## 2019-01-12 DIAGNOSIS — Z79899 Other long term (current) drug therapy: Secondary | ICD-10-CM | POA: Insufficient documentation

## 2019-01-12 DIAGNOSIS — Z7982 Long term (current) use of aspirin: Secondary | ICD-10-CM | POA: Diagnosis not present

## 2019-01-12 DIAGNOSIS — I509 Heart failure, unspecified: Secondary | ICD-10-CM | POA: Diagnosis not present

## 2019-01-12 HISTORY — DX: Fatty (change of) liver, not elsewhere classified: K76.0

## 2019-01-12 HISTORY — DX: Nonrheumatic mitral (valve) insufficiency: I34.0

## 2019-01-12 HISTORY — DX: Unspecified malignant neoplasm of skin, unspecified: C44.90

## 2019-01-12 HISTORY — DX: Chronic kidney disease, unspecified: N18.9

## 2019-01-12 HISTORY — DX: Personal history of other diseases of the circulatory system: Z86.79

## 2019-01-12 LAB — CBC
HCT: 42.1 % (ref 36.0–46.0)
Hemoglobin: 13.6 g/dL (ref 12.0–15.0)
MCH: 30.4 pg (ref 26.0–34.0)
MCHC: 32.3 g/dL (ref 30.0–36.0)
MCV: 94 fL (ref 80.0–100.0)
Platelets: 255 10*3/uL (ref 150–400)
RBC: 4.48 MIL/uL (ref 3.87–5.11)
RDW: 14.1 % (ref 11.5–15.5)
WBC: 9 10*3/uL (ref 4.0–10.5)
nRBC: 0 % (ref 0.0–0.2)

## 2019-01-12 LAB — COMPREHENSIVE METABOLIC PANEL
ALT: 18 U/L (ref 0–44)
AST: 18 U/L (ref 15–41)
Albumin: 3.6 g/dL (ref 3.5–5.0)
Alkaline Phosphatase: 110 U/L (ref 38–126)
Anion gap: 10 (ref 5–15)
BUN: 22 mg/dL (ref 8–23)
CO2: 24 mmol/L (ref 22–32)
Calcium: 9.2 mg/dL (ref 8.9–10.3)
Chloride: 105 mmol/L (ref 98–111)
Creatinine, Ser: 1.49 mg/dL — ABNORMAL HIGH (ref 0.44–1.00)
GFR calc Af Amer: 38 mL/min — ABNORMAL LOW (ref >60)
GFR calc non Af Amer: 33 mL/min — ABNORMAL LOW (ref >60)
Glucose, Bld: 124 mg/dL — ABNORMAL HIGH (ref 70–99)
Potassium: 4.8 mmol/L (ref 3.5–5.1)
Sodium: 139 mmol/L (ref 135–145)
Total Bilirubin: 0.3 mg/dL (ref 0.3–1.2)
Total Protein: 7.7 g/dL (ref 6.5–8.1)

## 2019-01-12 LAB — TYPE AND SCREEN
ABO/RH(D): O POS
Antibody Screen: NEGATIVE

## 2019-01-12 LAB — SURGICAL PCR SCREEN
MRSA, PCR: NEGATIVE
Staphylococcus aureus: NEGATIVE

## 2019-01-12 NOTE — Pre-Procedure Instructions (Signed)
[id number] [address], [address] [place] [address] Phone: [phone] Fax: [phone]      Your procedure is scheduled on  01-17-19 .  Report to [facility] Main Entrance "A" at 7 A.M., and check in at the Admitting office.  Call this number if you have problems the morning of surgery:  [phone]  Call [phone] if you have any questions prior to your surgery date Monday-Friday 8am-4pm    Remember:  Do not eat or drink after midnight the night before your surgery    Take these medicines the morning of surgery with A SIP OF WATER : omeprazole (PRILOSEC OTC)  metoprolol (LOPRESSOR) prednisoLONE acetate (PRED FORTE) 1 % ophthalmic suspension acetaminophen (TYLENOL)as needed nitroGLYCERIN (NITROSTAT) as needed  Follow your surgeon's instructions on when to stop Aspirin.  If no instructions were given by your surgeon then you will need to call the office to get those instructions.    7 days prior to surgery STOP taking any Aspirin (unless otherwise instructed by your surgeon), Aleve, Naproxen, Ibuprofen, Motrin, Advil, Goody's, BC's, all herbal medications, fish oil, and all vitamins.    The Morning of Surgery  Do not wear jewelry, make-up or nail polish.  Do not wear lotions, powders, or perfumes, or deodorant  Do not shave 48 hours prior to surgery.  Men may shave face and neck.  Do not bring valuables to the hospital.  [facility] is not responsible for any belongings or valuables.  If you are a smoker, DO NOT Smoke 24 hours prior to surgery IF you wear a CPAP at night please bring your mask, tubing, and machine the morning of surgery   Remember that you must have someone to transport you home after your surgery, and remain with you for 24 hours if you are discharged the same day.   Contacts, glasses, hearing aids, dentures or bridgework may not be worn into surgery.    Leave your suitcase in the car.  After surgery  it may be brought to your room.  For patients admitted to the hospital, discharge time will be determined by your treatment team.  Patients discharged the day of surgery will not be allowed to drive home.    Special instructions:   [place]- Preparing For Surgery  Before surgery, you can play an important role. Because skin is not sterile, your skin needs to be as free of germs as possible. You can reduce the number of germs on your skin by washing with CHG (chlorahexidine gluconate) Soap before surgery.  CHG is an antiseptic cleaner which kills germs and bonds with the skin to continue killing germs even after washing.    Oral Hygiene is also important to reduce your risk of infection.  Remember - BRUSH YOUR TEETH THE MORNING OF SURGERY WITH YOUR REGULAR TOOTHPASTE  Please do not use if you have an allergy to CHG or antibacterial soaps. If your skin becomes reddened/irritated stop using the CHG.  Do not shave (including legs and underarms) for at least 48 hours prior to first CHG shower. It is OK to shave your face.  Please follow these instructions carefully.   1. Shower the NIGHT BEFORE SURGERY and the MORNING OF SURGERY with CHG Soap.   2. If you chose to wash your hair, wash your hair first as usual with your normal shampoo.  3. After you shampoo, rinse your hair and body thoroughly to remove the shampoo.  4.  Use CHG as you would any other liquid soap. You can apply CHG directly to the skin and wash gently with a scrungie or a clean washcloth.   5. Apply the CHG Soap to your body ONLY FROM THE NECK DOWN.  Do not use on open wounds or open sores. Avoid contact with your eyes, ears, mouth and genitals (private parts). Wash Face and genitals (private parts)  with your normal soap.   6. Wash thoroughly, paying special attention to the area where your surgery will be performed.  7. Thoroughly rinse your body with warm water from the neck down.  8. DO NOT shower/wash with your  normal soap after using and rinsing off the CHG Soap.  9. Pat yourself dry with a CLEAN TOWEL.  10. Wear CLEAN PAJAMAS to bed the night before surgery, wear comfortable clothes the morning of surgery  11. Place CLEAN SHEETS on your bed the night of your first shower and DO NOT SLEEP WITH PETS.    Day of Surgery:  Do not apply any deodorants/lotions. Please shower the morning of surgery with the CHG soap  Please wear clean clothes to the hospital/surgery center.   Remember to brush your teeth WITH YOUR REGULAR TOOTHPASTE.   Please read over the  fact sheets that you were given.

## 2019-01-12 NOTE — Progress Notes (Signed)
PCP - Delanna Notice Cardiologist - Bosh Zakhary  Chest x-ray - 06-20-18 (in chart) EKG - 12-14-18 (in chart) Stress Test -  ECHO -  Cardiac Cath - 1989/1999   Aspirin Instructions: pt instructed to stop ASA (1) week prior to surgery.  Per pt, last dose was 01-09-19  COVID TEST- 01-15-19   Anesthesia review: yes, heart history  Pt brought in records from [facility], including EKG, cardiac clearance, and chest CT results.  See notes in chart.  Patient denies fever, cough and chest pain at PAT appointment Pt states she is SOB upon exertion, not a new finding.  Patient verbalized understanding of instructions that were given to them at the PAT appointment. Patient was also instructed that they will need to review over the PAT instructions again at home before surgery.

## 2019-01-15 ENCOUNTER — Encounter (HOSPITAL_COMMUNITY): Payer: Self-pay

## 2019-01-15 ENCOUNTER — Other Ambulatory Visit (HOSPITAL_COMMUNITY)
Admission: RE | Admit: 2019-01-15 | Discharge: 2019-01-15 | Disposition: A | Discharge status: Home / Self Care | Payer: Medicare Other | Source: Ambulatory Visit | Attending: Neurosurgery | Admitting: Neurosurgery

## 2019-01-15 DIAGNOSIS — Z01812 Encounter for preprocedural laboratory examination: Secondary | ICD-10-CM | POA: Insufficient documentation

## 2019-01-15 DIAGNOSIS — Z20828 Contact with and (suspected) exposure to other viral communicable diseases: Secondary | ICD-10-CM | POA: Insufficient documentation

## 2019-01-15 DIAGNOSIS — M48062 Spinal stenosis, lumbar region with neurogenic claudication: Secondary | ICD-10-CM | POA: Insufficient documentation

## 2019-01-15 LAB — SARS CORONAVIRUS 2 (TAT 6-24 HRS): SARS Coronavirus 2: NEGATIVE

## 2019-01-15 NOTE — Progress Notes (Addendum)
Anesthesia Chart Review:  Case: [id number] Date/Time: 01/17/19 0845   Procedure: Lumbar 3-4 Posterior lumbar interbody fusion (N/A ) - Lumbar 3-4 Posterior lumbar interbody fusion   Anesthesia type: General   Pre-op diagnosis: Spinal stenosis, Lumbar region with neurogenic claudication   Location: [facility] OR ROOM 18 / [place] [facility]   Surgeon: Ashok Pall, MD      DISCUSSION: Patient is an 79 year old female scheduled for the above procedure.  History includes former smoker, post-operative N/V, CAD (MI s/p mid and proximal LCX PTCA 02/16/88; PTCA/Duet stent x2 distal RCA 07/02/97; post-colecotmy MI 2017), ischemic cardiomyopathy, mitral regurgitation (mild-moderate 2019), CHF, palpitations, dyspnea, chronic renal insufficiency, GERD, microscopic colitis, thyroid nodule ("benign"), fatty liver, breast cancer (s/p right partial mastectomy ~ 2013), colon cancer (s/p partial colon resection, 04/2015), skin cancer (SCC), chronic back pain, back surgeries (L4-5 laminectomy/discectomy 03/05/10 with redo 06/26/10; L4-5 PLIF/posterolateral arthrodesis L4-5 06/28/10; posterolateral arthrodesis L5-S1/L5-S1 decompression/L4 pedicle screw and rods removal 10/11/13), 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  BMI is consistent with obesity.  She was last seen by her cardiologist Dr. Rosalita Chessman on 12/14/18. In regards to upcoming back surgery he wrote, "...Doing well, no heart failure symptoms, from the cardiovascular standpoint, she is stable to undergo back surgery if need be. There is no modifiable risk at this poin tthat will affect her outcome. Continue current meds."  She denied cough, fever, chest pain at PAT RN visit. 01/15/19 COVID-19 test negative. If no acute changes then I would anticipate that she can proceed as planned.   VS: BP 140/70   Pulse 66   Temp 36.5 C (Oral)   Resp 20   Ht 5\' 1"  (1.549 m)   Wt 93.9 kg   SpO2 100%   BMI 39.11 kg/m    PROVIDERS: Delanna Notice, MD is PCP/cardiologist ([place]) Laurena Slimmer, MD is HEM-ONC Rise Paganini, MD is ophthalmologist ([place])   LABS: Preoperative labs noted. Cr 1.49 (known CKD by cardiology notes; previous BUN/Cr 23/1.39 on 11/23/18 labs from Dr. Junius Roads).  (all labs ordered are listed, but only abnormal results are displayed)  Labs Reviewed  COMPREHENSIVE METABOLIC PANEL - Abnormal; Notable for the following components:      Result Value   Glucose, Bld 124 (*)    Creatinine, Ser 1.49 (*)    GFR calc non Af Amer 33 (*)    GFR calc Af Amer 38 (*)    All other components within normal limits  SURGICAL PCR SCREEN  CBC  TYPE AND SCREEN     IMAGES: CT Chest 06/20/18 ([place]): Impression: Degenerative changes within the spine. Interstitial changes in the lungs. No focal regions of consolidation or focal infiltrates.  Abdominal US 12/22/18: IMPRESSION: 1. No acute findings to account for the patient's symptoms. Specifically, no cholelithiasis or findings to suggest an acute cholecystitis. 2. Mild increased echogenicity of the renal parenchyma, suggesting underlying medical renal disease.  CT L-spine 11/07/17: IMPRESSION: 1. Solid fusion at L4-5 without residual recurrent stenosis at this level. 2. Posterior pedicle screws at L5-S1 without fusion across the facet joints. Vacuum phenomenon within the facet joints and lucency about the S1 screws suggests some degree of motion at this level and nonunion. 3. Adjacent level disease at L3-4 moderate right and mild left subarticular and foraminal stenosis, similar the prior study. 4. Mild rightward disc bulging at L2-3 without significant stenosis. 5.  Aortic Atherosclerosis (ICD10-I70.0).  No aneurysm is present.  EKG: 12/14/18 ([facility]): SR. Low voltage in precordial leads. Left atrial enlargement. Decreasing R-wave progression, may be secondary to pulmonary disease, consider old anterior infarct.    CV: Will  attempt to get copies of last cath, stress, and echo. According to 12/14/18 office note by Dr. Rosalita Chessman: - 09/03/14 LHC: LVEDP 22/LM: OK/LAD: 40% DX1/LCX: 40% mid with collaterals to RCA/RCA: CTO med tx 05/19/15 [facility]  NSTEMI TROP 8 post hemicolectomy for colon cancer. 05/19/15 Lexiscan: EF 60%/Moderate sized and severity fixed inf and inferior basal defect. 02/10/18 TTE: Suboptimal, EF 50% (was 45-50%), aortic valve sclerosis, mild-moderate MR, mild TR, grade 1 diastolic dysfunction, RVSP 30.   UPDATE 01/17/19 9:07 AM: The following records were received from [facility]: - Echo 03/30/18 ([facility]): Impression: 1.  Normal LV size and borderline normal systolic function.  The ejection fraction is 50%.  (Improved, was 45-50%.) Grade 1 diastolic dysfunction.  There is normal left ventricular wall thickness. 2.  Normal right ventricular size and normal function. 3.  There is mild to moderate mitral regurgitation. 4.  There is mild tricuspid regurgitation. 5.  Normal pulmonary artery systolic pressure.  PASP 30 mmHg.  - Nuclear stress test 04/22/15 ([place]):  Impressions:  1. Status post myocardial infarction in the right coronary artery or left circumflex territory with minimal amount of peri-infarct reversibility. 2.  Well preserved resting LV systolic function.  EF measured at 60%.  - Cardiac cath 09/03/14 ([place]): Findings: LM: Normal. Bifurcates into the LAD and left circumflex. LAD and CX proximally appeared to be calcified. LAD: Wraps around the apex of the left ventricle and no significant disease is seen.  D1 branch average size vessel with 40% stenosis in the proximal to mid segment. LCX: Gives rise to an average sized OM1.  There is 40% narrowing in the mid circumflex.  There is collateral filling to the distal right. RCA: Totally occluded in the proximal to mid segment, appears to be chronic occlusion with collateralization from the left  system. Impression: Significant disease involving the RCA, which is occluded with collaterals from the left system as outlined above with nonobstructive disease in the LAD and the left circumflex as outlined above.  The patient will be continued on aggressive medical therapy.   Past Medical History:  Diagnosis Date  . Arthritis   . Cancer (HCC)    breast  . CHF (congestive heart failure) ([place])   . Chronic back pain    synovial cyst,radiculopathy,and stenosis  . CKD (chronic kidney disease)   . Colon cancer ([place])   . Coronary artery disease   . Dyspnea   . Erosive esophagitis   . Fatty liver   . GERD (gastroesophageal reflux disease)    takes OTC Prilosec daily  . History of colon polyps   . History of ischemic cardiomyopathy   . Hyperlipidemia    takes Atorvastatin daily  . Microscopic colitis   . Mitral regurgitation    mild-moderate MR 02/10/18 [facility]  . Myocardial infarction ([place]) 1989, 2017  . Palpitations    takes betapace daily  . PONV (postoperative nausea and vomiting)    states Forane elevated her liver enzymes  . Skin cancer    (8) squamous cells  . Stress incontinence   . Thyroid nodule    benign    Past Surgical History:  Procedure Laterality Date  . ANKLE SURGERY Right 1990   pins and plate  . APPENDECTOMY    . BACK  SURGERY  2011  . BIOPSY THYROID    . CARDIAC CATHETERIZATION  1989/1999  . CARPAL TUNNEL RELEASE Right 2004  . cataract surgery with corneal transplant  Right 2008  . cataract surgery with corneal transplant  Left 2014  . COLON SURGERY  2017  . COLONOSCOPY    . COLONOSCOPY N/A 05/01/2015   Procedure: COLONOSCOPY;  Surgeon: Rogene Houston, MD;  [facility];  Service: Endoscopy;  Laterality: N/A;  . COLONOSCOPY N/A 07/28/2016   Procedure: COLONOSCOPY;  Surgeon: Rogene Houston, MD;  Location[facility];  Service: Endoscopy;  Laterality: N/A;  830 - moved to 4/11 @ 12:00  . COLONOSCOPY N/A 08/18/2017   Procedure:  COLONOSCOPY;  Surgeon: Rogene Houston, MD;  Location: [facility];  Service: Endoscopy;  Laterality: N/A;  930  . CORONARY ANGIOPLASTY     2 stents  . DILATION AND CURETTAGE OF UTERUS  2014  . ESOPHAGOGASTRODUODENOSCOPY    . ESOPHAGOGASTRODUODENOSCOPY N/A 07/28/2016   Procedure: ESOPHAGOGASTRODUODENOSCOPY (EGD);  Surgeon: Rogene Houston, MD;  [facility];  Service: Endoscopy;  Laterality: N/A;  . eye lid surgery Bilateral 2014  . MASTECTOMY, PARTIAL Right 2013  . POLYPECTOMY  08/18/2017   Procedure: POLYPECTOMY;  Surgeon: Rogene Houston, MD;  [facility];  Service: Endoscopy;;  transverse  . ROTATOR CUFF REPAIR Left 1989  . ROTATOR CUFF REPAIR Right 2001  . SPINAL FUSION  2012  . TOTAL ANKLE REPLACEMENT Right 2012    MEDICATIONS: . acetaminophen (TYLENOL) 500 MG tablet  . aspirin EC 81 MG tablet  . atorvastatin (LIPITOR) 80 MG tablet  . famotidine (PEPCID) 20 MG tablet  . furosemide (LASIX) 20 MG tablet  . loperamide (IMODIUM) 2 MG capsule  . losartan (COZAAR) 50 MG tablet  . metoprolol (LOPRESSOR) 100 MG tablet  . Multiple Vitamin (MULTIVITAMIN WITH MINERALS) TABS tablet  . nitroGLYCERIN (NITROSTAT) 0.4 MG SL tablet  . omeprazole (PRILOSEC OTC) 20 MG tablet  . Polyethyl Glycol-Propyl Glycol (SYSTANE) 0.4-0.3 % GEL ophthalmic gel  . prednisoLONE acetate (PRED FORTE) 1 % ophthalmic suspension  . Probiotic Product ([place])  . Wheat Dextrin (BENEFIBER PO)  . White Petrolatum-Mineral Oil (SYSTANE NIGHTTIME) OINT   No current facility-administered medications for this encounter.    . ondansetron (ZOFRAN) 4 mg in sodium chloride 0.9 % 50 mL IVPB  Last ASA 01/09/19.   Myra Gianotti, PA-C Surgical Short Stay/Anesthesiology [facility] Phone [phone] [facility] Phone [phone] 01/15/2019 10:32 PM

## 2019-01-15 NOTE — Anesthesia Preprocedure Evaluation (Addendum)
Anesthesia Evaluation  Patient identified by MRN, date of birth, ID band Patient awake    Reviewed: Allergy & Precautions, NPO status , Patient's Chart, lab work & pertinent test results, reviewed documented beta blocker date and time   History of Anesthesia Complications (+) PONV  Airway Mallampati: III  TM Distance: >3 FB Neck ROM: Full  Mouth opening: Limited Mouth Opening  Dental no notable dental hx. (+) Teeth Intact, Dental Advisory Given   Pulmonary former smoker,    Pulmonary exam normal breath sounds clear to auscultation       Cardiovascular hypertension, Pt. on medications and Pt. on home beta blockers + CAD, + Past MI and +CHF  Normal cardiovascular exam+ dysrhythmias + Valvular Problems/Murmurs MR  Rhythm:Regular Rate:Normal  MI 1989, NSTEMI postop 2017  CHFpEF  Last TTE 2019: Suboptimal, EF 50% (was 45-50%), aortic valve sclerosis, mild-moderate MR, mild TR, grade 1 diastolic dysfunction, RVSP 30.  Palpitations since colorectal surgery- started on metoprolol   UPDATE 01/16/19 12:45 PM: The following records were received from [facility]: - Nuclear stress test 04/22/15 ([place]):  Impressions:  1. Status post myocardial infarction in the right coronary artery or left circumflex territory with minimal amount of peri-infarct reversibility. 2.  Well preserved resting LV systolic function.  EF measured at 60%.  - Cardiac cath 09/03/14 ([place]): Findings: LM: Normal. Bifurcates into the LAD and left circumflex. LAD and CX proximally appeared to be calcified. LAD: Wraps around the apex of the left ventricle and no significant disease is seen.  D1 branch average size vessel with 40% stenosis in the proximal to mid segment. LCX: Gives rise to an average sized OM1.  There is 40% narrowing in the mid circumflex.  There is collateral filling to the distal right. RCA: Totally occluded in the proximal to  mid segment, appears to be chronic occlusion with collateralization from the left system. Impression: Significant disease involving the RCA, which is occluded with collaterals from the left system as outlined above with nonobstructive disease in the LAD and the left circumflex as outlined above.  The patient will be continued on aggressive medical therapy.    Neuro/Psych negative neurological ROS  negative psych ROS   GI/Hepatic PUD, GERD  Medicated,CRC s/p colectomy 0000000 complicated by postop NSTEMI  Erosive esophagitis   Endo/Other  Obesity BMI 39  Renal/GU CRFRenal disease   Stress incontinence    Musculoskeletal  (+) Arthritis , Osteoarthritis,  Chronic LBP   Abdominal Normal abdominal exam  (+)   Peds  Hematology  (+) anemia ,   Anesthesia Other Findings Elevated LFTs with isoflurane? In 2011  Reproductive/Obstetrics Hx breast ca s/p mastectomy 2013                           Anesthesia Physical Anesthesia Plan  ASA: III  Anesthesia Plan: General   Post-op Pain Management:    Induction: Intravenous  PONV Risk Score and Plan: 4 or greater and Ondansetron, Dexamethasone, Treatment may vary due to age or medical condition, Metaclopromide and Propofol infusion  Airway Management Planned: Oral ETT and Video Laryngoscope Planned  Additional Equipment: None  Intra-op Plan:   Post-operative Plan: Extubation in OR  Informed Consent: I have reviewed the patients History and Physical, chart, labs and discussed the procedure including the risks, benefits and alternatives for the proposed anesthesia with the patient or authorized representative who has indicated his/her understanding and acceptance.     Dental  advisory given  Plan Discussed with: CRNA  Anesthesia Plan Comments: ( )      Anesthesia Quick Evaluation                                  Anesthesia Evaluation  Patient identified by MRN, date of birth, ID band Patient  awake    Reviewed: Allergy & Precautions, H&P , NPO status , Patient's Chart, lab work & pertinent test results, reviewed documented beta blocker date and time   History of Anesthesia Complications (+) PONV and history of anesthetic complications  Airway Mallampati: III TM Distance: >3 FB   Mouth opening: Limited Mouth Opening  Dental  (+) Dental Advisory Given, Teeth Intact   Pulmonary          Cardiovascular hypertension, Pt. on medications and Pt. on home beta blockers + CAD and + Past MI     Neuro/Psych    GI/Hepatic PUD, GERD-  Medicated,  Endo/Other  Morbid obesity  Renal/GU      Musculoskeletal  (+) Arthritis -,   Abdominal   Peds  Hematology   Anesthesia Other Findings   Reproductive/Obstetrics                          Anesthesia Physical Anesthesia Plan  ASA: III  Anesthesia Plan: General   Post-op Pain Management:    Induction: Intravenous  Airway Management Planned: Oral ETT  Additional Equipment:   Intra-op Plan:   Post-operative Plan: Extubation in OR  Informed Consent: I have reviewed the patients History and Physical, chart, labs and discussed the procedure including the risks, benefits and alternatives for the proposed anesthesia with the patient or authorized representative who has indicated his/her understanding and acceptance.   Dental advisory given  Plan Discussed with: CRNA, Anesthesiologist and Surgeon  Anesthesia Plan Comments:         Anesthesia Quick Evaluation                                   Anesthesia Evaluation  Patient identified by MRN, date of birth, ID band Patient awake    Reviewed: Allergy & Precautions, H&P , NPO status , Patient's Chart, lab work & pertinent test results, reviewed documented beta blocker date and time   History of Anesthesia Complications (+) PONV and history of anesthetic complications  Airway Mallampati: III TM Distance: >3 FB   Mouth  opening: Limited Mouth Opening  Dental  (+) Dental Advisory Given, Teeth Intact   Pulmonary          Cardiovascular hypertension, Pt. on medications and Pt. on home beta blockers + CAD and + Past MI     Neuro/Psych    GI/Hepatic PUD, GERD-  Medicated,  Endo/Other  Morbid obesity  Renal/GU      Musculoskeletal  (+) Arthritis -,   Abdominal   Peds  Hematology   Anesthesia Other Findings   Reproductive/Obstetrics                          Anesthesia Physical Anesthesia Plan  ASA: III  Anesthesia Plan: General   Post-op Pain Management:    Induction: Intravenous  Airway Management Planned: Oral ETT  Additional Equipment:   Intra-op Plan:   Post-operative Plan: Extubation in OR  Informed Consent: I  have reviewed the patients History and Physical, chart, labs and discussed the procedure including the risks, benefits and alternatives for the proposed anesthesia with the patient or authorized representative who has indicated his/her understanding and acceptance.   Dental advisory given  Plan Discussed with: CRNA, Anesthesiologist and Surgeon  Anesthesia Plan Comments:         Anesthesia Quick Evaluation

## 2019-01-17 ENCOUNTER — Inpatient Hospital Stay (HOSPITAL_COMMUNITY): Payer: Medicare Other | Admitting: Vascular Surgery

## 2019-01-17 ENCOUNTER — Inpatient Hospital Stay (HOSPITAL_COMMUNITY): Payer: Medicare Other

## 2019-01-17 ENCOUNTER — Encounter (HOSPITAL_COMMUNITY)
Admission: RE | Disposition: A | Discharge status: Skilled Nursing Facility | Payer: Self-pay | Source: Home / Self Care | Attending: Neurosurgery

## 2019-01-17 ENCOUNTER — Other Ambulatory Visit: Payer: Self-pay

## 2019-01-17 ENCOUNTER — Inpatient Hospital Stay (HOSPITAL_COMMUNITY)
Admission: RE | Admit: 2019-01-17 | Discharge: 2019-01-27 | DRG: 454 | Disposition: A | Discharge status: Skilled Nursing Facility | Payer: Medicare Other | Attending: Neurosurgery | Admitting: Neurosurgery

## 2019-01-17 ENCOUNTER — Encounter (HOSPITAL_COMMUNITY): Payer: Self-pay | Admitting: *Deleted

## 2019-01-17 ENCOUNTER — Inpatient Hospital Stay (HOSPITAL_COMMUNITY): Payer: Medicare Other | Admitting: Certified Registered"

## 2019-01-17 DIAGNOSIS — M5136 Other intervertebral disc degeneration, lumbar region: Secondary | ICD-10-CM | POA: Diagnosis present

## 2019-01-17 DIAGNOSIS — Z853 Personal history of malignant neoplasm of breast: Secondary | ICD-10-CM

## 2019-01-17 DIAGNOSIS — Z800 Family history of malignant neoplasm of digestive organs: Secondary | ICD-10-CM

## 2019-01-17 DIAGNOSIS — I255 Ischemic cardiomyopathy: Secondary | ICD-10-CM | POA: Diagnosis present

## 2019-01-17 DIAGNOSIS — M47816 Spondylosis without myelopathy or radiculopathy, lumbar region: Secondary | ICD-10-CM | POA: Diagnosis not present

## 2019-01-17 DIAGNOSIS — N189 Chronic kidney disease, unspecified: Secondary | ICD-10-CM | POA: Diagnosis present

## 2019-01-17 DIAGNOSIS — R2689 Other abnormalities of gait and mobility: Secondary | ICD-10-CM | POA: Diagnosis not present

## 2019-01-17 DIAGNOSIS — I081 Rheumatic disorders of both mitral and tricuspid valves: Secondary | ICD-10-CM | POA: Diagnosis present

## 2019-01-17 DIAGNOSIS — Z85038 Personal history of other malignant neoplasm of large intestine: Secondary | ICD-10-CM

## 2019-01-17 DIAGNOSIS — Z8719 Personal history of other diseases of the digestive system: Secondary | ICD-10-CM

## 2019-01-17 DIAGNOSIS — K760 Fatty (change of) liver, not elsewhere classified: Secondary | ICD-10-CM | POA: Diagnosis present

## 2019-01-17 DIAGNOSIS — M545 Low back pain: Secondary | ICD-10-CM | POA: Diagnosis not present

## 2019-01-17 DIAGNOSIS — I2510 Atherosclerotic heart disease of native coronary artery without angina pectoris: Secondary | ICD-10-CM | POA: Diagnosis present

## 2019-01-17 DIAGNOSIS — Z981 Arthrodesis status: Secondary | ICD-10-CM

## 2019-01-17 DIAGNOSIS — Z20828 Contact with and (suspected) exposure to other viral communicable diseases: Secondary | ICD-10-CM | POA: Diagnosis present

## 2019-01-17 DIAGNOSIS — E785 Hyperlipidemia, unspecified: Secondary | ICD-10-CM | POA: Diagnosis present

## 2019-01-17 DIAGNOSIS — I5032 Chronic diastolic (congestive) heart failure: Secondary | ICD-10-CM | POA: Diagnosis not present

## 2019-01-17 DIAGNOSIS — Z48811 Encounter for surgical aftercare following surgery on the nervous system: Secondary | ICD-10-CM | POA: Diagnosis not present

## 2019-01-17 DIAGNOSIS — M4316 Spondylolisthesis, lumbar region: Secondary | ICD-10-CM | POA: Diagnosis present

## 2019-01-17 DIAGNOSIS — M6281 Muscle weakness (generalized): Secondary | ICD-10-CM | POA: Diagnosis not present

## 2019-01-17 DIAGNOSIS — M2550 Pain in unspecified joint: Secondary | ICD-10-CM | POA: Diagnosis not present

## 2019-01-17 DIAGNOSIS — I130 Hypertensive heart and chronic kidney disease with heart failure and stage 1 through stage 4 chronic kidney disease, or unspecified chronic kidney disease: Secondary | ICD-10-CM | POA: Diagnosis present

## 2019-01-17 DIAGNOSIS — K219 Gastro-esophageal reflux disease without esophagitis: Secondary | ICD-10-CM | POA: Diagnosis present

## 2019-01-17 DIAGNOSIS — Z419 Encounter for procedure for purposes other than remedying health state, unspecified: Secondary | ICD-10-CM

## 2019-01-17 DIAGNOSIS — M48062 Spinal stenosis, lumbar region with neurogenic claudication: Principal | ICD-10-CM | POA: Diagnosis present

## 2019-01-17 DIAGNOSIS — Z96661 Presence of right artificial ankle joint: Secondary | ICD-10-CM | POA: Diagnosis present

## 2019-01-17 DIAGNOSIS — M79675 Pain in left toe(s): Secondary | ICD-10-CM | POA: Diagnosis not present

## 2019-01-17 DIAGNOSIS — Z9049 Acquired absence of other specified parts of digestive tract: Secondary | ICD-10-CM

## 2019-01-17 DIAGNOSIS — I110 Hypertensive heart disease with heart failure: Secondary | ICD-10-CM | POA: Diagnosis not present

## 2019-01-17 DIAGNOSIS — Z87891 Personal history of nicotine dependence: Secondary | ICD-10-CM

## 2019-01-17 DIAGNOSIS — Z888 Allergy status to other drugs, medicaments and biological substances status: Secondary | ICD-10-CM

## 2019-01-17 DIAGNOSIS — Z9011 Acquired absence of right breast and nipple: Secondary | ICD-10-CM

## 2019-01-17 DIAGNOSIS — Z947 Corneal transplant status: Secondary | ICD-10-CM | POA: Diagnosis not present

## 2019-01-17 DIAGNOSIS — I252 Old myocardial infarction: Secondary | ICD-10-CM

## 2019-01-17 DIAGNOSIS — I509 Heart failure, unspecified: Secondary | ICD-10-CM | POA: Diagnosis not present

## 2019-01-17 DIAGNOSIS — Z885 Allergy status to narcotic agent status: Secondary | ICD-10-CM

## 2019-01-17 DIAGNOSIS — Z955 Presence of coronary angioplasty implant and graft: Secondary | ICD-10-CM

## 2019-01-17 DIAGNOSIS — R41841 Cognitive communication deficit: Secondary | ICD-10-CM | POA: Diagnosis not present

## 2019-01-17 DIAGNOSIS — Z86010 Personal history of colonic polyps: Secondary | ICD-10-CM | POA: Diagnosis not present

## 2019-01-17 DIAGNOSIS — Z7401 Bed confinement status: Secondary | ICD-10-CM | POA: Diagnosis not present

## 2019-01-17 DIAGNOSIS — Z85828 Personal history of other malignant neoplasm of skin: Secondary | ICD-10-CM

## 2019-01-17 DIAGNOSIS — M4326 Fusion of spine, lumbar region: Secondary | ICD-10-CM | POA: Diagnosis not present

## 2019-01-17 DIAGNOSIS — R5381 Other malaise: Secondary | ICD-10-CM | POA: Diagnosis not present

## 2019-01-17 LAB — CBC
HCT: 36.9 % (ref 36.0–46.0)
Hemoglobin: 11.9 g/dL — ABNORMAL LOW (ref 12.0–15.0)
MCH: 29.8 pg (ref 26.0–34.0)
MCHC: 32.2 g/dL (ref 30.0–36.0)
MCV: 92.3 fL (ref 80.0–100.0)
Platelets: 229 10*3/uL (ref 150–400)
RBC: 4 MIL/uL (ref 3.87–5.11)
RDW: 14 % (ref 11.5–15.5)
WBC: 13.2 10*3/uL — ABNORMAL HIGH (ref 4.0–10.5)
nRBC: 0 % (ref 0.0–0.2)

## 2019-01-17 LAB — CREATININE, SERUM
Creatinine, Ser: 1.43 mg/dL — ABNORMAL HIGH (ref 0.44–1.00)
GFR calc Af Amer: 40 mL/min — ABNORMAL LOW (ref >60)
GFR calc non Af Amer: 35 mL/min — ABNORMAL LOW (ref >60)

## 2019-01-17 SURGERY — POSTERIOR LUMBAR FUSION 1 LEVEL
Anesthesia: General | Site: Spine Lumbar

## 2019-01-17 MED ORDER — BISACODYL 5 MG PO TBEC: 5.0000 mg | DELAYED_RELEASE_TABLET | Freq: Every day | ORAL | Status: DC | PRN

## 2019-01-17 MED ORDER — OXYCODONE HCL 5 MG/5ML PO SOLN: 5.0000 mg | Freq: Once | ORAL | Status: DC | PRN

## 2019-01-17 MED ORDER — OXYCODONE HCL 5 MG PO TABS: 5.0000 mg | ORAL_TABLET | Freq: Once | ORAL | Status: DC | PRN

## 2019-01-17 MED ORDER — FENTANYL CITRATE (PF) 250 MCG/5ML IJ SOLN
INTRAMUSCULAR | Status: AC
  Filled 2019-01-17: qty 5

## 2019-01-17 MED ORDER — PHENOL 1.4 % MT LIQD: 1.0000 | OROMUCOSAL | Status: DC | PRN

## 2019-01-17 MED ORDER — ARTIFICIAL TEARS OPHTHALMIC OINT
TOPICAL_OINTMENT | Freq: Every day | OPHTHALMIC | Status: DC
  Administered 2019-01-19 – 2019-01-22 (×4): via OPHTHALMIC
  Administered 2019-01-23: 1 via OPHTHALMIC
  Administered 2019-01-24 – 2019-01-26 (×3): via OPHTHALMIC
  Filled 2019-01-17 (×2): qty 3.5

## 2019-01-17 MED ORDER — LIDOCAINE 2% (20 MG/ML) 5 ML SYRINGE
INTRAMUSCULAR | Status: AC
  Filled 2019-01-17: qty 5

## 2019-01-17 MED ORDER — DEXAMETHASONE SODIUM PHOSPHATE 10 MG/ML IJ SOLN
INTRAMUSCULAR | Status: DC | PRN
  Administered 2019-01-17: 10 mg via INTRAVENOUS

## 2019-01-17 MED ORDER — SODIUM CHLORIDE 0.9 % IV SOLN: 250.0000 mL | INTRAVENOUS | Status: DC

## 2019-01-17 MED ORDER — BACITRACIN ZINC 500 UNIT/GM EX OINT
TOPICAL_OINTMENT | CUTANEOUS | Status: DC | PRN
  Administered 2019-01-17: 1 via TOPICAL

## 2019-01-17 MED ORDER — MIDAZOLAM HCL 2 MG/2ML IJ SOLN
INTRAMUSCULAR | Status: AC
  Filled 2019-01-17: qty 2

## 2019-01-17 MED ORDER — OMEPRAZOLE 20 MG PO CPDR
20.0000 mg | DELAYED_RELEASE_CAPSULE | Freq: Every morning | ORAL | Status: DC
  Administered 2019-01-18 – 2019-01-27 (×10): 20 mg via ORAL
  Filled 2019-01-17 (×11): qty 1

## 2019-01-17 MED ORDER — ATORVASTATIN CALCIUM 80 MG PO TABS
80.0000 mg | ORAL_TABLET | Freq: Every evening | ORAL | Status: DC
  Administered 2019-01-17 – 2019-01-26 (×10): 80 mg via ORAL
  Filled 2019-01-17 (×10): qty 1

## 2019-01-17 MED ORDER — BUPIVACAINE HCL (PF) 0.5 % IJ SOLN
INTRAMUSCULAR | Status: DC | PRN
  Administered 2019-01-17: 30 mL

## 2019-01-17 MED ORDER — POLYVINYL ALCOHOL 1.4 % OP SOLN
1.0000 [drp] | Freq: Four times a day (QID) | OPHTHALMIC | Status: DC
  Administered 2019-01-20 – 2019-01-27 (×17): 1 [drp] via OPHTHALMIC
  Filled 2019-01-17: qty 15

## 2019-01-17 MED ORDER — DIAZEPAM 5 MG PO TABS
ORAL_TABLET | ORAL | Status: AC
  Filled 2019-01-17: qty 1

## 2019-01-17 MED ORDER — ONDANSETRON HCL 4 MG/2ML IJ SOLN
INTRAMUSCULAR | Status: AC
  Filled 2019-01-17: qty 2

## 2019-01-17 MED ORDER — PREDNISOLONE ACETATE 1 % OP SUSP
1.0000 [drp] | OPHTHALMIC | Status: DC
  Administered 2019-01-19 – 2019-01-23 (×7): 1 [drp] via OPHTHALMIC
  Filled 2019-01-17: qty 5

## 2019-01-17 MED ORDER — LIDOCAINE-EPINEPHRINE 0.5 %-1:200000 IJ SOLN
INTRAMUSCULAR | Status: AC
  Filled 2019-01-17: qty 1

## 2019-01-17 MED ORDER — CEFAZOLIN SODIUM-DEXTROSE 2-3 GM-%(50ML) IV SOLR
INTRAVENOUS | Status: DC | PRN
  Administered 2019-01-17: 2 g via INTRAVENOUS

## 2019-01-17 MED ORDER — OXYCODONE HCL 5 MG PO TABS
10.0000 mg | ORAL_TABLET | ORAL | Status: DC | PRN
  Administered 2019-01-19 – 2019-01-26 (×7): 10 mg via ORAL
  Filled 2019-01-17 (×8): qty 2

## 2019-01-17 MED ORDER — FAMOTIDINE 20 MG PO TABS
20.0000 mg | ORAL_TABLET | Freq: Every day | ORAL | Status: DC
  Administered 2019-01-17 – 2019-01-26 (×10): 20 mg via ORAL
  Filled 2019-01-17 (×10): qty 1

## 2019-01-17 MED ORDER — ROCURONIUM BROMIDE 10 MG/ML (PF) SYRINGE
PREFILLED_SYRINGE | INTRAVENOUS | Status: AC
  Filled 2019-01-17: qty 30

## 2019-01-17 MED ORDER — PROPOFOL 10 MG/ML IV BOLUS
INTRAVENOUS | Status: DC | PRN
  Administered 2019-01-17: 100 mg via INTRAVENOUS

## 2019-01-17 MED ORDER — DEXAMETHASONE SODIUM PHOSPHATE 10 MG/ML IJ SOLN
INTRAMUSCULAR | Status: AC
  Filled 2019-01-17: qty 1

## 2019-01-17 MED ORDER — ACETAMINOPHEN 500 MG PO TABS
1000.0000 mg | ORAL_TABLET | Freq: Once | ORAL | Status: AC
  Administered 2019-01-17: 08:00:00 1000 mg via ORAL
  Filled 2019-01-17: qty 2

## 2019-01-17 MED ORDER — FENTANYL CITRATE (PF) 100 MCG/2ML IJ SOLN
25.0000 ug | INTRAMUSCULAR | Status: DC | PRN
  Administered 2019-01-17 (×2): 50 ug via INTRAVENOUS

## 2019-01-17 MED ORDER — ACETAMINOPHEN 325 MG PO TABS
650.0000 mg | ORAL_TABLET | ORAL | Status: DC | PRN
  Administered 2019-01-18 – 2019-01-26 (×6): 650 mg via ORAL
  Filled 2019-01-17 (×7): qty 2

## 2019-01-17 MED ORDER — BACITRACIN ZINC 500 UNIT/GM EX OINT
TOPICAL_OINTMENT | CUTANEOUS | Status: AC
  Filled 2019-01-17: qty 28.35

## 2019-01-17 MED ORDER — SENNOSIDES-DOCUSATE SODIUM 8.6-50 MG PO TABS
1.0000 | ORAL_TABLET | Freq: Every evening | ORAL | Status: DC | PRN
  Administered 2019-01-26: 09:00:00 1 via ORAL
  Filled 2019-01-17: qty 1

## 2019-01-17 MED ORDER — SODIUM CHLORIDE 0.9% FLUSH
3.0000 mL | INTRAVENOUS | Status: DC | PRN
  Administered 2019-01-26 (×2): 3 mL via INTRAVENOUS
  Filled 2019-01-17 (×2): qty 3

## 2019-01-17 MED ORDER — NITROGLYCERIN 0.4 MG SL SUBL: 0.4000 mg | SUBLINGUAL_TABLET | SUBLINGUAL | Status: DC | PRN

## 2019-01-17 MED ORDER — ONDANSETRON HCL 4 MG PO TABS
4.0000 mg | ORAL_TABLET | Freq: Four times a day (QID) | ORAL | Status: DC | PRN
  Administered 2019-01-22: 4 mg via ORAL
  Filled 2019-01-17 (×2): qty 1

## 2019-01-17 MED ORDER — ZOLPIDEM TARTRATE 5 MG PO TABS
5.0000 mg | ORAL_TABLET | Freq: Every evening | ORAL | Status: DC | PRN
  Administered 2019-01-20 – 2019-01-22 (×2): 5 mg via ORAL
  Filled 2019-01-17 (×2): qty 1

## 2019-01-17 MED ORDER — ONDANSETRON HCL 4 MG/2ML IJ SOLN
4.0000 mg | Freq: Four times a day (QID) | INTRAMUSCULAR | Status: DC | PRN
  Administered 2019-01-19 – 2019-01-24 (×3): 4 mg via INTRAVENOUS
  Filled 2019-01-17 (×3): qty 2

## 2019-01-17 MED ORDER — FENTANYL CITRATE (PF) 100 MCG/2ML IJ SOLN
INTRAMUSCULAR | Status: DC | PRN
  Administered 2019-01-17: 50 ug via INTRAVENOUS
  Administered 2019-01-17: 100 ug via INTRAVENOUS

## 2019-01-17 MED ORDER — SYSTANE NIGHTTIME OP OINT: 1.0000 "application " | TOPICAL_OINTMENT | Freq: Every day | OPHTHALMIC | Status: DC

## 2019-01-17 MED ORDER — LIDOCAINE 2% (20 MG/ML) 5 ML SYRINGE
INTRAMUSCULAR | Status: DC | PRN
  Administered 2019-01-17: 60 mg via INTRAVENOUS

## 2019-01-17 MED ORDER — LOSARTAN POTASSIUM 50 MG PO TABS
50.0000 mg | ORAL_TABLET | Freq: Every day | ORAL | Status: DC
  Administered 2019-01-17 – 2019-01-27 (×11): 50 mg via ORAL
  Filled 2019-01-17 (×12): qty 1

## 2019-01-17 MED ORDER — CELECOXIB 200 MG PO CAPS
200.0000 mg | ORAL_CAPSULE | Freq: Two times a day (BID) | ORAL | Status: DC
  Filled 2019-01-17 (×2): qty 1

## 2019-01-17 MED ORDER — ONDANSETRON HCL 4 MG/2ML IJ SOLN: 4.0000 mg | Freq: Once | INTRAMUSCULAR | Status: DC | PRN

## 2019-01-17 MED ORDER — CEFAZOLIN SODIUM-DEXTROSE 2-4 GM/100ML-% IV SOLN
INTRAVENOUS | Status: AC
  Filled 2019-01-17: qty 100

## 2019-01-17 MED ORDER — POTASSIUM CHLORIDE IN NACL 20-0.9 MEQ/L-% IV SOLN: INTRAVENOUS | Status: DC

## 2019-01-17 MED ORDER — FUROSEMIDE 20 MG PO TABS
20.0000 mg | ORAL_TABLET | ORAL | Status: DC
  Administered 2019-01-18 – 2019-01-26 (×5): 20 mg via ORAL
  Filled 2019-01-17 (×9): qty 1

## 2019-01-17 MED ORDER — MENTHOL 3 MG MT LOZG: 1.0000 | LOZENGE | OROMUCOSAL | Status: DC | PRN

## 2019-01-17 MED ORDER — LOPERAMIDE HCL 2 MG PO CAPS: 2.0000 mg | ORAL_CAPSULE | ORAL | Status: DC | PRN

## 2019-01-17 MED ORDER — DIAZEPAM 5 MG PO TABS
5.0000 mg | ORAL_TABLET | Freq: Four times a day (QID) | ORAL | Status: DC | PRN
  Administered 2019-01-17 – 2019-01-25 (×8): 5 mg via ORAL
  Filled 2019-01-17 (×7): qty 1

## 2019-01-17 MED ORDER — OXYCODONE HCL 5 MG PO TABS
5.0000 mg | ORAL_TABLET | ORAL | Status: DC | PRN
  Administered 2019-01-18 – 2019-01-20 (×3): 5 mg via ORAL
  Filled 2019-01-17 (×3): qty 1

## 2019-01-17 MED ORDER — THROMBIN 20000 UNITS EX SOLR
CUTANEOUS | Status: DC | PRN
  Administered 2019-01-17: 10:00:00 20 mL

## 2019-01-17 MED ORDER — FENTANYL CITRATE (PF) 100 MCG/2ML IJ SOLN
INTRAMUSCULAR | Status: AC
  Filled 2019-01-17: qty 2

## 2019-01-17 MED ORDER — DOCUSATE SODIUM 100 MG PO CAPS
100.0000 mg | ORAL_CAPSULE | Freq: Two times a day (BID) | ORAL | Status: DC
  Administered 2019-01-18 – 2019-01-27 (×17): 100 mg via ORAL
  Filled 2019-01-17 (×19): qty 1

## 2019-01-17 MED ORDER — SODIUM CHLORIDE 0.9 % IV SOLN
0.0500 ug/kg/min | INTRAVENOUS | Status: AC
  Administered 2019-01-17: 10:00:00 0.05 ug/kg/min via INTRAVENOUS
  Filled 2019-01-17: qty 4000

## 2019-01-17 MED ORDER — SODIUM CHLORIDE 0.9% FLUSH
3.0000 mL | Freq: Two times a day (BID) | INTRAVENOUS | Status: DC
  Administered 2019-01-17 – 2019-01-23 (×12): 3 mL via INTRAVENOUS
  Administered 2019-01-23: 10 mL via INTRAVENOUS
  Administered 2019-01-24 – 2019-01-27 (×5): 3 mL via INTRAVENOUS

## 2019-01-17 MED ORDER — ACETAMINOPHEN 650 MG RE SUPP: 650.0000 mg | RECTAL | Status: DC | PRN

## 2019-01-17 MED ORDER — PROPOFOL 10 MG/ML IV BOLUS
INTRAVENOUS | Status: AC
  Filled 2019-01-17: qty 20

## 2019-01-17 MED ORDER — 0.9 % SODIUM CHLORIDE (POUR BTL) OPTIME
TOPICAL | Status: DC | PRN
  Administered 2019-01-17: 1000 mL

## 2019-01-17 MED ORDER — PROPOFOL 500 MG/50ML IV EMUL
INTRAVENOUS | Status: DC | PRN
  Administered 2019-01-17: 75 ug/kg/min via INTRAVENOUS
  Administered 2019-01-17: 12:00:00 via INTRAVENOUS

## 2019-01-17 MED ORDER — HEPARIN SODIUM (PORCINE) 5000 UNIT/ML IJ SOLN
5000.0000 [IU] | Freq: Three times a day (TID) | INTRAMUSCULAR | Status: DC
  Filled 2019-01-17 (×3): qty 1

## 2019-01-17 MED ORDER — ADULT MULTIVITAMIN W/MINERALS CH
1.0000 | ORAL_TABLET | Freq: Every day | ORAL | Status: DC
  Administered 2019-01-17 – 2019-01-27 (×11): 1 via ORAL
  Filled 2019-01-17 (×12): qty 1

## 2019-01-17 MED ORDER — POLYETHYL GLYCOL-PROPYL GLYCOL 0.4-0.3 % OP GEL
1.0000 "application " | Freq: Four times a day (QID) | OPHTHALMIC | Status: DC
  Filled 2019-01-17 (×3): qty 10

## 2019-01-17 MED ORDER — METOPROLOL TARTRATE 50 MG PO TABS
100.0000 mg | ORAL_TABLET | Freq: Two times a day (BID) | ORAL | Status: DC
  Administered 2019-01-17 – 2019-01-27 (×20): 100 mg via ORAL
  Filled 2019-01-17 (×14): qty 2
  Filled 2019-01-17: qty 4
  Filled 2019-01-17 (×5): qty 2

## 2019-01-17 MED ORDER — ROCURONIUM BROMIDE 50 MG/5ML IV SOSY
PREFILLED_SYRINGE | INTRAVENOUS | Status: DC | PRN
  Administered 2019-01-17: 50 mg via INTRAVENOUS
  Administered 2019-01-17: 20 mg via INTRAVENOUS
  Administered 2019-01-17: 50 mg via INTRAVENOUS

## 2019-01-17 MED ORDER — MAGNESIUM CITRATE PO SOLN
1.0000 | Freq: Once | ORAL | Status: AC | PRN
  Administered 2019-01-22: 1 via ORAL
  Filled 2019-01-17: qty 296

## 2019-01-17 MED ORDER — THROMBIN 20000 UNITS EX SOLR
CUTANEOUS | Status: AC
  Filled 2019-01-17: qty 20000

## 2019-01-17 MED ORDER — OXYCODONE HCL ER 10 MG PO T12A
10.0000 mg | EXTENDED_RELEASE_TABLET | Freq: Two times a day (BID) | ORAL | Status: DC
  Administered 2019-01-17 – 2019-01-27 (×20): 10 mg via ORAL
  Filled 2019-01-17 (×20): qty 1

## 2019-01-17 MED ORDER — BUPIVACAINE HCL (PF) 0.5 % IJ SOLN
INTRAMUSCULAR | Status: AC
  Filled 2019-01-17: qty 30

## 2019-01-17 MED ORDER — ASPIRIN EC 81 MG PO TBEC
81.0000 mg | DELAYED_RELEASE_TABLET | Freq: Every evening | ORAL | Status: DC
  Administered 2019-01-18 – 2019-01-26 (×9): 81 mg via ORAL
  Filled 2019-01-17 (×10): qty 1

## 2019-01-17 MED ORDER — LIDOCAINE-EPINEPHRINE 0.5 %-1:200000 IJ SOLN
INTRAMUSCULAR | Status: DC | PRN
  Administered 2019-01-17: 10 mL

## 2019-01-17 MED ORDER — HYDROMORPHONE HCL 1 MG/ML IJ SOLN: 0.5000 mg | INTRAMUSCULAR | Status: DC | PRN

## 2019-01-17 MED ORDER — LACTATED RINGERS IV SOLN
INTRAVENOUS | Status: DC
  Administered 2019-01-17 (×3): via INTRAVENOUS

## 2019-01-17 MED ORDER — SUGAMMADEX SODIUM 200 MG/2ML IV SOLN
INTRAVENOUS | Status: DC | PRN
  Administered 2019-01-17: 200 mg via INTRAVENOUS

## 2019-01-17 SURGICAL SUPPLY — 68 items
BASKET BONE COLLECTION (BASKET) ×3 IMPLANT
BENZOIN TINCTURE PRP APPL 2/3 (GAUZE/BANDAGES/DRESSINGS) ×3 IMPLANT
BLADE CLIPPER SURG (BLADE) IMPLANT
BUR MATCHSTICK NEURO 3.0 LAGG (BURR) ×3 IMPLANT
BUR PRECISION FLUTE 5.0 (BURR) ×3 IMPLANT
CAGE POST IBF 10X8D 26/9 (Cage) ×6 IMPLANT
CANISTER SUCT 3000ML PPV (MISCELLANEOUS) ×3 IMPLANT
CARTRIDGE OIL MAESTRO DRILL (MISCELLANEOUS) ×1 IMPLANT
CLOSURE WOUND 1/2 X4 (GAUZE/BANDAGES/DRESSINGS) ×1
CONT SPEC 4OZ CLIKSEAL STRL BL (MISCELLANEOUS) ×3 IMPLANT
COVER BACK TABLE 60X90IN (DRAPES) ×3 IMPLANT
DECANTER SPIKE VIAL GLASS SM (MISCELLANEOUS) ×3 IMPLANT
DERMABOND ADVANCED (GAUZE/BANDAGES/DRESSINGS) ×2
DERMABOND ADVANCED .7 DNX12 (GAUZE/BANDAGES/DRESSINGS) ×1 IMPLANT
DIFFUSER DRILL AIR PNEUMATIC (MISCELLANEOUS) ×3 IMPLANT
DRAPE C-ARM 42X72 X-RAY (DRAPES) ×3 IMPLANT
DRAPE C-ARMOR (DRAPES) ×3 IMPLANT
DRAPE LAPAROTOMY 100X72X124 (DRAPES) ×3 IMPLANT
DRAPE POUCH INSTRU U-SHP 10X18 (DRAPES) ×3 IMPLANT
DRAPE SURG 17X23 STRL (DRAPES) ×3 IMPLANT
DRSG OPSITE POSTOP 4X8 (GAUZE/BANDAGES/DRESSINGS) ×3 IMPLANT
DURAPREP 26ML APPLICATOR (WOUND CARE) ×3 IMPLANT
ELECT REM PT RETURN 9FT ADLT (ELECTROSURGICAL) ×3
ELECTRODE REM PT RTRN 9FT ADLT (ELECTROSURGICAL) ×1 IMPLANT
GAUZE 4X4 16PLY RFD (DISPOSABLE) IMPLANT
GAUZE SPONGE 4X4 12PLY STRL (GAUZE/BANDAGES/DRESSINGS) IMPLANT
GLOVE BIOGEL PI IND STRL 6.5 (GLOVE) ×2 IMPLANT
GLOVE BIOGEL PI IND STRL 7.0 (GLOVE) ×1 IMPLANT
GLOVE BIOGEL PI INDICATOR 6.5 (GLOVE) ×4
GLOVE BIOGEL PI INDICATOR 7.0 (GLOVE) ×2
GLOVE ECLIPSE 6.5 STRL STRAW (GLOVE) ×6 IMPLANT
GLOVE SURG SS PI 6.5 STRL IVOR (GLOVE) ×12 IMPLANT
GOWN STRL REUS W/ TWL LRG LVL3 (GOWN DISPOSABLE) ×3 IMPLANT
GOWN STRL REUS W/ TWL XL LVL3 (GOWN DISPOSABLE) IMPLANT
GOWN STRL REUS W/TWL 2XL LVL3 (GOWN DISPOSABLE) IMPLANT
GOWN STRL REUS W/TWL LRG LVL3 (GOWN DISPOSABLE) ×6
GOWN STRL REUS W/TWL XL LVL3 (GOWN DISPOSABLE)
GRAFT DURAGEN MATRIX 1WX1L (Tissue) ×3 IMPLANT
KIT BASIN OR (CUSTOM PROCEDURE TRAY) ×3 IMPLANT
KIT POSITION SURG JACKSON T1 (MISCELLANEOUS) ×3 IMPLANT
KIT TURNOVER KIT B (KITS) ×3 IMPLANT
MILL MEDIUM DISP (BLADE) ×3 IMPLANT
NEEDLE HYPO 25X1 1.5 SAFETY (NEEDLE) ×3 IMPLANT
NEEDLE SPNL 18GX3.5 QUINCKE PK (NEEDLE) ×3 IMPLANT
NS IRRIG 1000ML POUR BTL (IV SOLUTION) ×3 IMPLANT
OIL CARTRIDGE MAESTRO DRILL (MISCELLANEOUS) ×3
PACK LAMINECTOMY NEURO (CUSTOM PROCEDURE TRAY) ×3 IMPLANT
PAD ARMBOARD 7.5X6 YLW CONV (MISCELLANEOUS) ×6 IMPLANT
PATTIES SURGICAL .5 X.5 (GAUZE/BANDAGES/DRESSINGS) ×3 IMPLANT
ROD CC 30MM (Rod) ×6 IMPLANT
SCREW 5.5X30MM (Screw) ×6 IMPLANT
SCREW ATS 5.5X40MM (Screw) ×6 IMPLANT
SCREW SET SOLERA (Screw) ×8 IMPLANT
SCREW SET SOLERA TI (Screw) ×4 IMPLANT
SEALANT ADHERUS EXTEND TIP (MISCELLANEOUS) ×3 IMPLANT
SPONGE LAP 4X18 RFD (DISPOSABLE) IMPLANT
SPONGE SURGIFOAM ABS GEL 100 (HEMOSTASIS) ×3 IMPLANT
STRIP CLOSURE SKIN 1/2X4 (GAUZE/BANDAGES/DRESSINGS) ×2 IMPLANT
SUT ETHILON 3 0 FSL (SUTURE) ×6 IMPLANT
SUT PROLENE 6 0 BV (SUTURE) IMPLANT
SUT VIC AB 0 CT1 18XCR BRD8 (SUTURE) ×2 IMPLANT
SUT VIC AB 0 CT1 8-18 (SUTURE) ×4
SUT VIC AB 2-0 CT1 18 (SUTURE) ×3 IMPLANT
SUT VIC AB 3-0 SH 8-18 (SUTURE) ×3 IMPLANT
TOWEL GREEN STERILE (TOWEL DISPOSABLE) ×3 IMPLANT
TOWEL GREEN STERILE FF (TOWEL DISPOSABLE) ×3 IMPLANT
TRAY FOLEY MTR SLVR 16FR STAT (SET/KITS/TRAYS/PACK) ×3 IMPLANT
WATER STERILE IRR 1000ML POUR (IV SOLUTION) ×3 IMPLANT

## 2019-01-17 NOTE — H&P (Signed)
BP (!) 143/51   Pulse 71   Temp 98.1 F (36.7 C) (Oral)   Resp 20   Ht 5\' 1"  (1.549 m)   Wt 93.9 kg   SpO2 98%   BMI 39.11 kg/m  Erin Cooke returned today.  It has actually been a year, which I am surprised about, but at this point she says she just cannot walk or stand for any reasonable period of time.  She is not able to do the things that she would like to do.  Last year around this time she was needing eye surgery and she underwent a corneal transplant and does have that straight now.  She is better.  At this point, she is thinking again about her lumbar spine.  The last one we have was over a year ago, so I will get a new film.  She is 5 feet 1 inch, weighs 208 pounds.  She has absolutely no pain when she is sitting.  It is 7/10 if she is standing or walking.  She has full strength in the hamstrings, quadriceps, hip flexors, hip extensors, dorsiflexors, and plantar flexors.  Reflexes are trace at the knees and trace at the ankles.  I will go ahead and order a new MRI and I will see her in the office afterward. Allergies  Allergen Reactions  . Demerol [Meperidine Hcl] Nausea And Vomiting  . Forane [Isoflurane] Other (See Comments)    Elevated liver enzymes  . Letrozole Other (See Comments)    Muscle pain   Past Medical History:  Diagnosis Date  . Arthritis   . Cancer (HCC)    breast  . CHF (congestive heart failure) ([place])   . Chronic back pain    synovial cyst,radiculopathy,and stenosis  . CKD (chronic kidney disease)   . Colon cancer ([place])   . Coronary artery disease   . Dyspnea   . Erosive esophagitis   . Fatty liver   . GERD (gastroesophageal reflux disease)    takes OTC Prilosec daily  . History of colon polyps   . History of ischemic cardiomyopathy   . Hyperlipidemia    takes Atorvastatin daily  . Microscopic colitis   . Mitral regurgitation    mild-moderate MR 02/10/18 [facility]  . Myocardial infarction ([place]) 1989, 2017  . Palpitations    takes betapace  daily  . PONV (postoperative nausea and vomiting)    states Forane elevated her liver enzymes  . Skin cancer    (8) squamous cells  . Stress incontinence   . Thyroid nodule    benign   Past Surgical History:  Procedure Laterality Date  . ANKLE SURGERY Right 1990   pins and plate  . APPENDECTOMY    . BACK SURGERY  2011  . BIOPSY THYROID    . CARDIAC CATHETERIZATION  1989/1999  . CARPAL TUNNEL RELEASE Right 2004  . cataract surgery with corneal transplant  Right 2008  . cataract surgery with corneal transplant  Left 2014  . COLON SURGERY  2017  . COLONOSCOPY    . COLONOSCOPY N/A 05/01/2015   Procedure: COLONOSCOPY;  Surgeon: Rogene Houston, MD;  [facility];  Service: Endoscopy;  Laterality: N/A;  . COLONOSCOPY N/A 07/28/2016   Procedure: COLONOSCOPY;  Surgeon: Rogene Houston, MD;  [facility];  Service: Endoscopy;  Laterality: N/A;  830 - moved to 4/11 @ 12:00  . COLONOSCOPY N/A 08/18/2017   Procedure: COLONOSCOPY;  Surgeon: Rogene Houston, MD;  [facility];  Service: Endoscopy;  Laterality: N/A;  930  . CORONARY ANGIOPLASTY     2 stents  . DILATION AND CURETTAGE OF UTERUS  2014  . ESOPHAGOGASTRODUODENOSCOPY    . ESOPHAGOGASTRODUODENOSCOPY N/A 07/28/2016   Procedure: ESOPHAGOGASTRODUODENOSCOPY (EGD);  Surgeon: Rogene Houston, MD;  [facility];  Service: Endoscopy;  Laterality: N/A;  . eye lid surgery Bilateral 2014  . MASTECTOMY, PARTIAL Right 2013  . POLYPECTOMY  08/18/2017   Procedure: POLYPECTOMY;  Surgeon: Rogene Houston, MD;  [facility];  Service: Endoscopy;;  transverse  . ROTATOR CUFF REPAIR Left 1989  . ROTATOR CUFF REPAIR Right 2001  . SPINAL FUSION  2012  . TOTAL ANKLE REPLACEMENT Right 2012   Family History  Problem Relation Age of Onset  . Colon cancer Other    Social History   Socioeconomic History  . Marital status: Widowed    Spouse name: Not on file  . Number of children: Not on file  .  Years of education: Not on file  . Highest education level: Not on file  Occupational History  . Not on file  Social Needs  . Financial resource strain: Not on file  . Food insecurity    Worry: Not on file    Inability: Not on file  . Transportation needs    Medical: Not on file    Non-medical: Not on file  Tobacco Use  . Smoking status: Former Smoker    Packs/day: 0.25    Years: 10.00    Pack years: 2.50  . Smokeless tobacco: Never Used  Substance and Sexual Activity  . Alcohol use: No  . Drug use: No  . Sexual activity: Never    Birth control/protection: Post-menopausal  Lifestyle  . Physical activity    Days per week: Not on file    Minutes per session: Not on file  . Stress: Not on file  Relationships  . Social Herbalist on phone: Not on file    Gets together: Not on file    Attends religious service: Not on file    Active member of club or organization: Not on file    Attends meetings of clubs or organizations: Not on file    Relationship status: Not on file  . Intimate partner violence    Fear of current or ex partner: Not on file    Emotionally abused: Not on file    Physically abused: Not on file    Forced sexual activity: Not on file  Other Topics Concern  . Not on file  Social History Narrative  . Not on file   Erin Cooke returns after the MRI.  It confirms a lumbar stenosis present at L3-4.  She gives a classic story of neurogenic claudication.  She can sit without pain.  She cannot walk without significant discomfort.         ASSESSMENT AND PLAN :  She would at this point like to undergo operative decompression at the L3-4 level secondary to lumbar stenosis and listhesis present due to the degeneration of this and some facet arthropathy.  I would plan on doing an arthrodesis at that level with pedicle screws, possibly interbody cages, but she is fused at 4-5, she is fused at 5-1, and I just think it is too much of a levon to remove  that much bone. She's lilthesis already and not pursue instrumented posterolateral arthrodesis and the possibility of interbody cages.  She understands  having had this procedure previously.

## 2019-01-17 NOTE — Anesthesia Procedure Notes (Signed)
Procedure Name: Intubation Date/Time: 01/17/2019 9:30 AM Performed by: Babs Bertin, CRNA Pre-anesthesia Checklist: Patient identified, Emergency Drugs available, Suction available and Patient being monitored Patient Re-evaluated:Patient Re-evaluated prior to induction Oxygen Delivery Method: Circle System Utilized Preoxygenation: Pre-oxygenation with 100% oxygen Induction Type: IV induction Ventilation: Mask ventilation without difficulty Laryngoscope Size: Mac and 3 Grade View: Grade III Tube type: Oral Tube size: 7.0 mm Number of attempts: 1 Airway Equipment and Method: Stylet and Oral airway Placement Confirmation: ETT inserted through vocal cords under direct vision,  positive ETCO2 and breath sounds checked- equal and bilateral Secured at: 20 cm Tube secured with: Tape Dental Injury: Teeth and Oropharynx as per pre-operative assessment

## 2019-01-17 NOTE — Transfer of Care (Signed)
Immediate Anesthesia Transfer of Care Note  Patient: Erin Cooke  Procedure(s) Performed: Lumbar Three-Four Posterior lumbar interbody fusion (N/A Spine Lumbar)  Patient Location: PACU  Anesthesia Type:General  Level of Consciousness: awake, alert  and oriented  Airway & Oxygen Therapy: Patient Spontanous Breathing and Patient connected to nasal cannula oxygen  Post-op Assessment: Report given to RN and Post -op Vital signs reviewed and stable  Post vital signs: Reviewed and stable  Last Vitals:  Vitals Value Taken Time  BP 164/85 01/17/19 1340  Temp    Pulse 76 01/17/19 1341  Resp 15 01/17/19 1341  SpO2 100 % 01/17/19 1341  Vitals shown include unvalidated device data.  Last Pain:  Vitals:   01/17/19 0757  TempSrc:   PainSc: 0-No pain      Patients Stated Pain Goal: 0 ([id number])  Complications: No apparent anesthesia complications

## 2019-01-17 NOTE — Anesthesia Postprocedure Evaluation (Signed)
Anesthesia Post Note  Patient: Erin Cooke  Procedure(s) Performed: Lumbar Three-Four Posterior lumbar interbody fusion (N/A Spine Lumbar)     Patient location during evaluation: PACU Anesthesia Type: General Level of consciousness: awake and alert Pain management: pain level controlled Vital Signs Assessment: post-procedure vital signs reviewed and stable Respiratory status: spontaneous breathing, nonlabored ventilation, respiratory function stable and patient connected to nasal cannula oxygen Cardiovascular status: blood pressure returned to baseline and stable Postop Assessment: no apparent nausea or vomiting Anesthetic complications: no    Last Vitals:  Vitals:   01/17/19 1500 01/17/19 1510  BP:  (!) 167/70  Pulse: 77 67  Resp: 13 14  Temp:    SpO2: 100% 100%    Last Pain:  Vitals:   01/17/19 1429  TempSrc:   PainSc: 7                  Eliyana Pagliaro L Marae Cottrell

## 2019-01-17 NOTE — Op Note (Signed)
01/17/2019  1:22 PM  PATIENT:  [name]  78 y.o. female presenting with spinal stenosis with neurogenic claudication. Having failed conservative measures she has opted for operative decompression.   PRE-OPERATIVE DIAGNOSIS:  Spinal stenosis, Lumbar region with neurogenic claudication, L3/4  POST-OPERATIVE DIAGNOSIS:  Spinal stenosis, Lumbar region with neurogenic claudication, L3/4  PROCEDURE:  Procedure(s):laminectomy in excess of the needed exposures for a Plif Lumbar Three-Four Posterior lumbar interbody fusion with synthes Conduit cages 10x 6mm filled with autograft morsels Posterior non segmental pedicle screw fixation Medtronic legacy  SURGEON:  Surgeon(s): Ashok Pall, MD  ASSISTANTS:none  ANESTHESIA:   general  EBL:  Total I/O In: 1500 [I.V.:1500] Out: 295 [Urine:145; Blood:150]  BLOOD ADMINISTERED:none    COUNT:per nursing  DRAINS: none   SPECIMEN:  No Specimen  DICTATION: Erin Cooke is a 78 y.o. female whom was taken to the operating room intubated, and placed under a general anesthetic without difficulty. A foley catheter was placed under sterile conditions. She was positioned prone on a Jackson stable with all pressure points properly padded.  Her lumbar region was prepped and draped in a sterile manner. I infiltrated 20cc's 1/2%lidocaine/1:2000,000 strength epinephrine into the planned incision. I opened the skin with a 10 blade and took the incision down to the thoracolumbar fascia. I exposed the lamina of L3 in a subperiosteal fashion bilaterally. I confirmed my location with an intraoperative xray.  I placed self retaining retractors and started the decompression.  I decompressed the spinal canal at L3/4 via a near complete laminectomy and inferior facetectomies of L3 bilaterally. I also performed a hemilaminectomy of L4. I was able to fully decompress the L3 roots bilaterally through the foramina and lateral recesses. I used the drill and  Kerrison punches to remove the bone. PLIF's were performed at L3/4 in the same fashion. I opened the disc space with a 15 blade then used a variety of instruments to remove the disc and prepare the space for the arthrodesis. I used curettes, rongeurs, punches, shavers for the disc space, and rasps in the discetomy. I measured the disc space and placed 2 10x51mm conduit synthes cages into the disc space(s).  I I placed pedicle screws at L3, and L4, using fluoroscopic guidance. I drilled a pilot hole, then cannulated the pedicle with a drill at each site. I then tapped each pedicle, assessing each site for pedicle violations. No cutouts were appreciated. Screws (Medtronic Legacy) were then placed at each site without difficulty. I attached rods and locking caps with the appropriate tools. The locking caps were secured with torque limited screwdrivers. Final films were performed and the final construct appeared to be in good position.  I closed the wound in a layered fashion. I approximated the thoracolumbar fascia, subcutaneous, and subcuticular planes with vicryl sutures. Final layer was the skin with a 3-0 nylon I used an occlusive bandage for a sterile dressing.     PLAN OF CARE: Admit to inpatient   PATIENT DISPOSITION:  PACU - hemodynamically stable.   Delay start of Pharmacological VTE agent (>24hrs) due to surgical blood loss or risk of bleeding:  yes

## 2019-01-18 MED ORDER — WHITE PETROLATUM EX OINT
TOPICAL_OINTMENT | CUTANEOUS | Status: AC
  Administered 2019-01-18: 0.2
  Filled 2019-01-18: qty 28.35

## 2019-01-18 NOTE — Plan of Care (Signed)
  Problem: Education: Goal: Knowledge of General Education information will improve Description Including pain rating scale, medication(s)/side effects and non-pharmacologic comfort measures Outcome: Progressing   Problem: Health Behavior/Discharge Planning: Goal: Ability to manage health-related needs will improve Outcome: Progressing   Problem: Clinical Measurements: Goal: Ability to maintain clinical measurements within normal limits will improve Outcome: Progressing Goal: Will remain free from infection Outcome: Progressing Goal: Diagnostic test results will improve Outcome: Progressing Goal: Respiratory complications will improve Outcome: Progressing Goal: Cardiovascular complication will be avoided Outcome: Progressing   Problem: Activity: Goal: Risk for activity intolerance will decrease Outcome: Progressing   Problem: Nutrition: Goal: Adequate nutrition will be maintained Outcome: Progressing   Problem: Coping: Goal: Level of anxiety will decrease Outcome: Progressing   Problem: Elimination: Goal: Will not experience complications related to bowel motility Outcome: Progressing Goal: Will not experience complications related to urinary retention Outcome: Progressing   Problem: Pain Managment: Goal: General experience of comfort will improve Outcome: Progressing   Problem: Safety: Goal: Ability to remain free from injury will improve Outcome: Progressing   Problem: Skin Integrity: Goal: Risk for impaired skin integrity will decrease Outcome: Progressing   Problem: Education: Goal: Ability to verbalize activity precautions or restrictions will improve Outcome: Progressing Goal: Knowledge of the prescribed therapeutic regimen will improve Outcome: Progressing Goal: Understanding of discharge needs will improve Outcome: Progressing   Problem: Activity: Goal: Ability to avoid complications of mobility impairment will improve Outcome: Progressing Goal:  Ability to tolerate increased activity will improve Outcome: Progressing Goal: Will remain free from falls Outcome: Progressing   Problem: Bowel/Gastric: Goal: Gastrointestinal status for postoperative course will improve Outcome: Progressing   Problem: Clinical Measurements: Goal: Ability to maintain clinical measurements within normal limits will improve Outcome: Progressing Goal: Postoperative complications will be avoided or minimized Outcome: Progressing Goal: Diagnostic test results will improve Outcome: Progressing   Problem: Pain Management: Goal: Pain level will decrease Outcome: Progressing   Problem: Skin Integrity: Goal: Will show signs of wound healing Outcome: Progressing   Problem: Health Behavior/Discharge Planning: Goal: Identification of resources available to assist in meeting health care needs will improve Outcome: Progressing   Problem: Bladder/Genitourinary: Goal: Urinary functional status for postoperative course will improve Outcome: Progressing   Caillou Minus, BSN ,RN 

## 2019-01-18 NOTE — Progress Notes (Signed)
Patient ID: Erin Cooke, female   DOB: 26-Jul-1940, 78 y.o.   MRN: [id number] BP 104/74 (BP Location: Left Arm)   Pulse 68   Temp 99.4 F (37.4 C) (Oral)   Resp 18   Ht 5\' 1"  (1.549 m)   Wt 97.3 kg   SpO2 99%   BMI 40.53 kg/m  Alert and oriented x 4, speech is clear Moving lower extremities, mild weakness right quad Wound dressing dry Discharge Saturday, will allow to be up tomorrow.

## 2019-01-18 NOTE — Progress Notes (Signed)
PT Cancellation Note  Patient Details Name: Erin Cooke MRN: [id number] DOB: 10/30/1940   Cancelled Treatment:    Reason Eval/Treat Not Completed: Medical issues which prohibited therapy patient reports the MD told her she needed to be on bedrest due to excessive bleeding from surgical site, unable to find documentation of this in charting available to me but RN confirms this is accurate. Holding therapy for now, will try to attempt back if time/schedule allow and if patient is medically appropriate.    Deniece Ree PT, DPT, CBIS  Supplemental Physical Therapist [facility]    Pager [phone] Acute Rehab Office [phone]

## 2019-01-18 NOTE — Plan of Care (Signed)
  Problem: Education: Goal: Knowledge of General Education information will improve Description Including pain rating scale, medication(s)/side effects and non-pharmacologic comfort measures Outcome: Progressing   Problem: Health Behavior/Discharge Planning: Goal: Ability to manage health-related needs will improve Outcome: Progressing   Problem: Clinical Measurements: Goal: Ability to maintain clinical measurements within normal limits will improve Outcome: Progressing Goal: Will remain free from infection Outcome: Progressing Goal: Diagnostic test results will improve Outcome: Progressing Goal: Respiratory complications will improve Outcome: Progressing Goal: Cardiovascular complication will be avoided Outcome: Progressing   Problem: Activity: Goal: Risk for activity intolerance will decrease Outcome: Progressing   Problem: Nutrition: Goal: Adequate nutrition will be maintained Outcome: Progressing   Problem: Coping: Goal: Level of anxiety will decrease Outcome: Progressing   Problem: Elimination: Goal: Will not experience complications related to bowel motility Outcome: Progressing Goal: Will not experience complications related to urinary retention Outcome: Progressing   Problem: Pain Managment: Goal: General experience of comfort will improve Outcome: Progressing   Problem: Safety: Goal: Ability to remain free from injury will improve Outcome: Progressing   Problem: Skin Integrity: Goal: Risk for impaired skin integrity will decrease Outcome: Progressing   Problem: Education: Goal: Ability to verbalize activity precautions or restrictions will improve Outcome: Progressing Goal: Knowledge of the prescribed therapeutic regimen will improve Outcome: Progressing Goal: Understanding of discharge needs will improve Outcome: Progressing   Problem: Activity: Goal: Ability to avoid complications of mobility impairment will improve Outcome: Progressing Goal:  Ability to tolerate increased activity will improve Outcome: Progressing Goal: Will remain free from falls Outcome: Progressing   Problem: Bowel/Gastric: Goal: Gastrointestinal status for postoperative course will improve Outcome: Progressing   Problem: Clinical Measurements: Goal: Ability to maintain clinical measurements within normal limits will improve Outcome: Progressing Goal: Postoperative complications will be avoided or minimized Outcome: Progressing Goal: Diagnostic test results will improve Outcome: Progressing   Problem: Pain Management: Goal: Pain level will decrease Outcome: Progressing   Problem: Skin Integrity: Goal: Will show signs of wound healing Outcome: Progressing   Problem: Health Behavior/Discharge Planning: Goal: Identification of resources available to assist in meeting health care needs will improve Outcome: Progressing   Problem: Bladder/Genitourinary: Goal: Urinary functional status for postoperative course will improve Outcome: Progressing   Jayshaun Phillips, BSN ,RN 

## 2019-01-19 NOTE — Evaluation (Signed)
Physical Therapy Evaluation Patient Details Name: Erin Cooke MRN: [id number] DOB: 09-11-1940 Today's Date: 01/19/2019   History of Present Illness  78yo female s/p L3-L4 fusion done 01/17/19. PMH MI, mitral regurgitation, HLD, ischemic cardiomyopathy, CAD, colon CA, CKD, back pain, CHF, total ankle replacement, B rotator cuff repairs, back surgery  Clinical Impression   RN reports patient is doing much better, OK for OOB with therapy. Patient received in bed, pleasant and willing to work with therapy but very anxious regarding mobility, stating "oh this is going to be bad" multiple times during session. Verbally reviewed back precautions, then required MinA for rolling, followed by MaxA for sidelying to sit at EOB and min guard for scooting hips forward to EOB to get feet supported. Heavy VC and TC for sequencing and hand placement, also to preserve back precautions, additionally ModA to power up to standing with RW. Deferred gait due to patient's extremely high anxiety levels towards mobility today, however able to take pivotal steps towards chair with MinA for RW management and balance. Note posterior lean when sitting EOB, milder but still present when standing requiring MinA to maintain upright. She was left up in the chair with all needs met, chair alarm active and nurse tech aware of mobility status. She will continue to benefit from skilled PT Services in the acute setting, currently recommending ST-SNF due to significant mobility deficits and fall risk at this time.     Follow Up Recommendations SNF;Supervision/Assistance - 24 hour    Equipment Recommendations  Rolling walker with 5" wheels;3in1 (PT)    Recommendations for Other Services       Precautions / Restrictions Precautions Precautions: Back;Fall Precaution Booklet Issued: No(verbally reviewed precautions) Precaution Comments: no brace needed per order set Restrictions Weight Bearing Restrictions: No      Mobility  Bed Mobility Overal bed mobility: Needs Assistance Bed Mobility: Rolling;Sidelying to Sit Rolling: Min assist Sidelying to sit: Max assist       General bed mobility comments: MinA for rolling completely to her side, MaxA to power up to full sitting position, min guard for scooting hips forward at EOB. Mod VC for technique throughout. Posterior lean siitting EOB  Transfers Overall transfer level: Needs assistance Equipment used: Rolling walker (2 wheeled) Transfers: Sit to/from [facility] Sit to Stand: Mod assist Stand pivot transfers: Min assist       General transfer comment: Heavy VC for sequencing and correct hand placement, ModA to power up to standing at bedside; MinA for RW management with stand pivot to chair and for posterior lean  Ambulation/Gait             General Gait Details: deferred due to high levels of patient anxiety, balance impairment but able to take pivotal steps to chair during transfer  Stairs            Wheelchair Mobility    Modified Rankin (Stroke Patients Only)       Balance Overall balance assessment: Needs assistance Sitting-balance support: Bilateral upper extremity supported;Feet supported Sitting balance-Leahy Scale: Fair Sitting balance - Comments: posterior lean, able to correct with VC and min guard Postural control: Posterior lean   Standing balance-Leahy Scale: Poor Standing balance comment: heavy reliance on B UE support                             Pertinent Vitals/Pain Pain Assessment: 0-10 Pain Score: 5  Pain Location: back/surgical site Pain Descriptors /  Indicators: Burning;Sore Pain Intervention(s): Limited activity within patient's tolerance;Monitored during session;Repositioned    Home Living Family/patient expects to be discharged to:: Private residence Living Arrangements: Children(with son) Available Help at Discharge: Available 24 hours/day Type of Home: House Home  Access: Stairs to enter Entrance Stairs-Rails: Can reach both Entrance Stairs-Number of Steps: can drive around side of house and go in the back where there is a level entry. If she were to go in the front, there would be 10 steps with B railings Home Layout: Able to live on main level with bedroom/bathroom;Laundry or work area in basement(does not have to go down to basement) Home Equipment: Environmental consultant - 2 wheels;Walker - standard;Grab bars - tub/shower;Shower seat      Prior Function Level of Independence: Independent               Hand Dominance   Dominant Hand: Right    Extremity/Trunk Assessment   Upper Extremity Assessment Upper Extremity Assessment: Generalized weakness    Lower Extremity Assessment Lower Extremity Assessment: Generalized weakness    Cervical / Trunk Assessment Cervical / Trunk Assessment: Kyphotic  Communication   Communication: No difficulties  Cognition Arousal/Alertness: Awake/alert Behavior During Therapy: WFL for tasks assessed/performed;Anxious Overall Cognitive Status: Within Functional Limits for tasks assessed                                 General Comments: A&Ox4      General Comments      Exercises     Assessment/Plan    PT Assessment Patient needs continued PT services  PT Problem List Decreased strength;Decreased mobility;Decreased safety awareness;Decreased coordination;Obesity;Decreased activity tolerance;Cardiopulmonary status limiting activity;Decreased balance;Decreased knowledge of use of DME;Pain       PT Treatment Interventions DME instruction;Therapeutic activities;Gait training;Therapeutic exercise;Patient/family education;Stair training;Balance training;Functional mobility training;Neuromuscular re-education    PT Goals (Current goals can be found in the Care Plan section)  Acute Rehab PT Goals Patient Stated Goal: try to go home PT Goal Formulation: With patient Time For Goal Achievement:  02/02/19 Potential to Achieve Goals: Fair    Frequency Min 5X/week   Barriers to discharge        Co-evaluation               AM-PAC PT "6 Clicks" Mobility  Outcome Measure Help needed turning from your back to your side while in a flat bed without using bedrails?: A Little Help needed moving from lying on your back to sitting on the side of a flat bed without using bedrails?: A Lot Help needed moving to and from a bed to a chair (including a wheelchair)?: A Lot Help needed standing up from a chair using your arms (e.g., wheelchair or bedside chair)?: A Lot Help needed to walk in hospital room?: A Little Help needed climbing 3-5 steps with a railing? : Total 6 Click Score: 13    End of Session Equipment Utilized During Treatment: Gait belt Activity Tolerance: Patient tolerated treatment well;Patient limited by pain Patient left: in chair;with call bell/phone within reach;with chair alarm set Nurse Communication: Mobility status PT Visit Diagnosis: Unsteadiness on feet (R26.81);Difficulty in walking, not elsewhere classified (R26.2);Muscle weakness (generalized) (M62.81);Pain Pain - Right/Left: (back pain) Pain - part of body: (back pain)    Time: 9774-1423 PT Time Calculation (min) (ACUTE ONLY): 40 min   Charges:   PT Evaluation $PT Eval Moderate Complexity: 1 Mod PT Treatments $Therapeutic Activity: 23-37 mins  Deniece Ree PT, DPT, CBIS  Supplemental Physical Therapist [facility]    Pager [phone] Acute Rehab Office [phone]

## 2019-01-19 NOTE — Care Management Important Message (Signed)
Important Message  Patient Details  Name: Erin Cooke MRN: [id number] Date of Birth: 24-Jun-1940   Medicare Important Message Given:  Yes     Orbie Pyo 01/19/2019, 3:09 PM

## 2019-01-19 NOTE — Progress Notes (Signed)
Patient ID: Erin Cooke, female   DOB: [birth year], 78 y.o.   MRN: [id number] BP (!) 99/50 (BP Location: Left Arm)   Pulse 81   Temp 99.5 F (37.5 C) (Oral)   Resp 20   Ht 5\' 1"  (1.549 m)   Wt 97.3 kg   SpO2 96%   BMI 40.53 kg/m  Alert, oriented x 4 Moving lower extremities Wound clean Pt tomorrow

## 2019-01-20 NOTE — Progress Notes (Signed)
Patient ID: Erin Cooke, female   DOB: 1941-03-15, 78 y.o.   MRN: [id number] BP (!) 130/58 (BP Location: Left Arm)   Pulse 95   Temp 99.1 F (37.3 C) (Oral)   Resp 16   Ht 5\' 1"  (1.549 m)   Wt 97.3 kg   SpO2 (!) 86%   BMI 40.53 kg/m  Alert and oriented x 4, speech is clear and fluent Moving all extremities well Wound without signs of infection Working with pt

## 2019-01-20 NOTE — Evaluation (Signed)
Occupational Therapy Evaluation Patient Details Name: Erin Cooke MRN: [id number] DOB: 1941-04-16 Today's Date: 01/20/2019    History of Present Illness Pt is an 78 y/o female s/p L3-L4 fusion done 01/17/19. PMH MI, mitral regurgitation, HLD, ischemic cardiomyopathy, CAD, colon CA, CKD, back pain, CHF, total ankle replacement, B rotator cuff repairs, back surgery   Clinical Impression   PTA patient independent. Admitted for above and limited by problem list below, including back pain, R LE numbness, impaired balance, generalized weakness. Patient currently requires min-mod assist for bed mobility, min assist for transfers, mod assist for toileting and max assist for LB ADLs.  She was educated on back precautions, ADL compensatory techniques and recommendations. She will benefit from continued OT services while admitted and after dc at SNF level in order to maximize independence and return to PLOF with ADLs, mobility.     Follow Up Recommendations  CIR;Supervision/Assistance - 24 hour    Equipment Recommendations  3 in 1 bedside commode    Recommendations for Other Services       Precautions / Restrictions Precautions Precautions: Back;Fall Precaution Comments: no brace needed per order set Restrictions Weight Bearing Restrictions: No      Mobility Bed Mobility Overal bed mobility: Needs Assistance Bed Mobility: Rolling;Sidelying to Sit;Sit to Sidelying Rolling: Supervision Sidelying to sit: Min assist     Sit to sidelying: Mod assist General bed mobility comments: cueing for technique, increased time and effort, mgmt for trunk to ascend to EOB and B LES back to supine   Transfers Overall transfer level: Needs assistance Equipment used: Rolling walker (2 wheeled) Transfers: Sit to/from [facility] Sit to Stand: Min assist Stand pivot transfers: Min assist       General transfer comment: cueing for hand placement and safety, min assist to power up  in to standing from EOB and BSC     Balance Overall balance assessment: Needs assistance Sitting-balance support: Feet supported Sitting balance-Leahy Scale: Fair     Standing balance support: Bilateral upper extremity supported;During functional activity;Single extremity supported Standing balance-Leahy Scale: Poor Standing balance comment: relaint on BUE and external support                            ADL either performed or assessed with clinical judgement   ADL Overall ADL's : Needs assistance/impaired     Grooming: Set up;Sitting   Upper Body Bathing: Set up;Sitting   Lower Body Bathing: Moderate assistance;Sit to/from stand Lower Body Bathing Details (indicate cue type and reason): unable to reach B LEs or buttocks, min assist sit<>Stand   Upper Body Dressing : Set up;Sitting   Lower Body Dressing: Maximal assistance;Sit to/from stand Lower Body Dressing Details (indicate cue type and reason): unable to reach B LEs, assist to pull underwear up; sit to stand min A Toilet Transfer: Minimal assistance;RW;Stand-pivot;BSC   Toileting- Clothing Manipulation and Hygiene: Sit to/from stand;Moderate assistance       Functional mobility during ADLs: Minimal assistance;Rolling walker;Cueing for safety;Cueing for sequencing General ADL Comments: pt limited by pain, weakness and impaired balance      Vision         Perception     Praxis      Pertinent Vitals/Pain Pain Assessment: 0-10 Pain Score: 4  Pain Location: back- incisional Pain Descriptors / Indicators: Burning;Sore Pain Intervention(s): Monitored during session;Repositioned     Hand Dominance Right   Extremity/Trunk Assessment Upper Extremity Assessment Upper Extremity Assessment: Generalized  weakness   Lower Extremity Assessment Lower Extremity Assessment: Defer to PT evaluation       Communication Communication Communication: No difficulties   Cognition Arousal/Alertness:  Awake/alert Behavior During Therapy: WFL for tasks assessed/performed;Anxious Overall Cognitive Status: Within Functional Limits for tasks assessed                                     General Comments  reports R LE numbness    Exercises     Shoulder Instructions      Home Living Family/patient expects to be discharged to:: Private residence Living Arrangements: Children Available Help at Discharge: Available 24 hours/day Type of Home: House Home Access: Stairs to enter [employer] of Steps: can drive around side of house and go in the back where there is a level entry. If she were to go in the front, there would be 10 steps with B railings Entrance Stairs-Rails: Can reach both Home Layout: Able to live on main level with bedroom/bathroom;Laundry or work area in [place] Shower/Tub: Teacher, early years/pre: [place]: Environmental consultant - 2 wheels;Walker - standard;Grab bars - tub/shower;Shower seat          Prior Functioning/Environment Level of Independence: Independent                 OT Problem List: Decreased strength;Decreased activity tolerance;Impaired balance (sitting and/or standing);Decreased knowledge of use of DME or AE;Decreased knowledge of precautions;Pain      OT Treatment/Interventions: Self-care/ADL training;DME and/or AE instruction;Therapeutic activities;Patient/family education;Balance training    OT Goals(Current goals can be found in the care plan section) Acute Rehab OT Goals Patient Stated Goal: try to go home OT Goal Formulation: With patient Time For Goal Achievement: 02/03/19 Potential to Achieve Goals: Good  OT Frequency: Min 2X/week   Barriers to D/C:            Co-evaluation              AM-PAC OT "6 Clicks" Daily Activity     Outcome Measure Help from another person eating meals?: None Help from another person taking care of personal grooming?: A Little Help  from another person toileting, which includes using toliet, bedpan, or urinal?: A Lot Help from another person bathing (including washing, rinsing, drying)?: A Lot Help from another person to put on and taking off regular upper body clothing?: A Little Help from another person to put on and taking off regular lower body clothing?: A Lot 6 Click Score: 16   End of Session Equipment Utilized During Treatment: Gait belt;Rolling walker Nurse Communication: Mobility status  Activity Tolerance: Patient tolerated treatment well Patient left: in bed;with call bell/phone within reach;with bed alarm set  OT Visit Diagnosis: Other abnormalities of gait and mobility (R26.89);Pain;Muscle weakness (generalized) (M62.81) Pain - part of body: (back-incisional)                Time: [id number] OT Time Calculation (min): 25 min Charges:  OT General Charges $OT Visit: 1 Visit OT Evaluation $OT Eval Moderate Complexity: 1 Mod OT Treatments $Self Care/Home Management : 8-22 mins  Delight Stare, OT Acute Rehabilitation Services Pager [phone] Office [phone]   Delight Stare 01/20/2019, 2:26 PM

## 2019-01-20 NOTE — Progress Notes (Signed)
Physical Therapy Treatment Patient Details Name: Erin Cooke MRN: [id number] DOB: October 21, 1940 Today's Date: 01/20/2019    History of Present Illness Pt is an 78 y/o female s/p L3-L4 fusion done 01/17/19. PMH MI, mitral regurgitation, HLD, ischemic cardiomyopathy, CAD, colon CA, CKD, back pain, CHF, total ankle replacement, B rotator cuff repairs, back surgery    PT Comments    Pt remains greatly limited with mobility secondary to pain and weakness with nausea also a limiting factor this session. She was able to tolerate transfers x2 with min-mod A needed. Continue to recommend further PT services at a SNF prior to returning home. Pt would continue to benefit from skilled physical therapy services at this time while admitted and after d/c to address the below listed limitations in order to improve overall safety and independence with functional mobility.    Follow Up Recommendations  SNF;Supervision/Assistance - 24 hour     Equipment Recommendations  None recommended by PT    Recommendations for Other Services       Precautions / Restrictions Precautions Precautions: Back;Fall Precaution Comments: no brace needed per order set Restrictions Weight Bearing Restrictions: No    Mobility  Bed Mobility Overal bed mobility: Needs Assistance Bed Mobility: Rolling;Sidelying to Sit Rolling: Supervision Sidelying to sit: Mod assist       General bed mobility comments: increased time and effort, use of bed rails, assistance to move bilateral LEs off of bed and for trunk elevation to achieve upright sitting at EOB  Transfers Overall transfer level: Needs assistance Equipment used: Rolling walker (2 wheeled) Transfers: Sit to/from [facility] Sit to Stand: Mod assist;Min assist Stand pivot transfers: Min assist       General transfer comment: increased time and effort needed, pt initially only needing min A to rise into standing from EOB; however, needing mod A  to stand from [facility]; min A for pivotal movements towards her R and her L for stability and safety; cueing needed throughout   Ambulation/Gait             General Gait Details: unable to tolerate at this time due to increasing nausea   Stairs             Wheelchair Mobility    Modified Rankin (Stroke Patients Only)       Balance Overall balance assessment: Needs assistance Sitting-balance support: Feet supported Sitting balance-Leahy Scale: Fair     Standing balance support: Bilateral upper extremity supported Standing balance-Leahy Scale: Poor Standing balance comment: heavy reliance on B UE support                            Cognition Arousal/Alertness: Awake/alert Behavior During Therapy: WFL for tasks assessed/performed;Anxious Overall Cognitive Status: Within Functional Limits for tasks assessed                                        Exercises      General Comments        Pertinent Vitals/Pain Pain Assessment: Faces Faces Pain Scale: Hurts even more Pain Location: back/surgical site and R hip Pain Descriptors / Indicators: Burning;Sore Pain Intervention(s): Monitored during session;Repositioned    Home Living                      Prior Function  PT Goals (current goals can now be found in the care plan section) Acute Rehab PT Goals PT Goal Formulation: With patient Time For Goal Achievement: 02/02/19 Potential to Achieve Goals: Fair Progress towards PT goals: Progressing toward goals    Frequency    Min 5X/week      PT Plan Current plan remains appropriate    Co-evaluation              AM-PAC PT "6 Clicks" Mobility   Outcome Measure  Help needed turning from your back to your side while in a flat bed without using bedrails?: A Little Help needed moving from lying on your back to sitting on the side of a flat bed without using bedrails?: A Lot Help needed moving to and from a  bed to a chair (including a wheelchair)?: A Lot Help needed standing up from a chair using your arms (e.g., wheelchair or bedside chair)?: A Lot Help needed to walk in hospital room?: A Lot Help needed climbing 3-5 steps with a railing? : Total 6 Click Score: 12    End of Session Equipment Utilized During Treatment: Gait belt Activity Tolerance: Patient limited by pain;Patient limited by fatigue;Other (comment)(limited secondary to increasing nausea) Patient left: in chair;with call bell/phone within reach;with chair alarm set Nurse Communication: Mobility status PT Visit Diagnosis: Muscle weakness (generalized) (M62.81);Pain;Other abnormalities of gait and mobility (R26.89) Pain - part of body: (back)     Time: [id number] PT Time Calculation (min) (ACUTE ONLY): 28 min  Charges:  $Therapeutic Activity: 23-37 mins                     Sherie Don, [place], DPT  Acute Rehabilitation Services Pager [phone] Office [place] 01/20/2019, 10:12 AM

## 2019-01-20 NOTE — TOC Initial Note (Signed)
Transition of Care [facility]) - Initial/Assessment Note    Patient Details  Name: Erin Cooke MRN: [id number] Date of Birth: January 21, 1941  Transition of [facility]) CM/SW Contact:    Gelene Mink, [place] Phone Number: 01/20/2019, 3:02 PM  Clinical Narrative:                  CSW met with the patient at bedside. Her son was present in the room. CSW introduced herself and explained her role. CSW shared the therapy recommendation. The patient has completed inpatient rehab during her last hospitalization in [place]. She stated that she would need to think about it and discuss it with her son. She stated that she had some concerns. CSW answered her questions and explained the process. CSW provided a copy of the CMS SNF List. The patient asked for time to discuss with her son and the MD. CSW stated that she would follow up with her on Sunday.   CSW will continue to follow and assist with disposition planning.    Expected Discharge Plan: (To be determined) Barriers to Discharge: SNF Pending bed offer, Continued Medical Work up   Patient Goals and CMS Choice Patient states their goals for this hospitalization and ongoing recovery are:: Pt wants to feel better CMS Medicare.gov Compare Post Acute Care list provided to:: Patient Choice offered to / list presented to : Patient  Expected Discharge Plan and Services Expected Discharge Plan: (To be determined) In-house Referral: Clinical Social Work   Post Acute Care Choice: (To be determined) Living arrangements for the past 2 months: Single Family Home                                      Prior Living Arrangements/Services Living arrangements for the past 2 months: Single Family Home Lives with:: Self Patient language and need for interpreter reviewed:: No Do you feel safe going back to the place where you live?: Yes      Need for Family Participation in Patient Care: No (Comment) Care giver support system in place?: Yes (comment)    Criminal Activity/Legal Involvement Pertinent to Current Situation/Hospitalization: No - Comment as needed  Activities of Daily Living Home Assistive Devices/Equipment: Eyeglasses ADL Screening (condition at time of admission) Patient's cognitive ability adequate to safely complete daily activities?: Yes Is the patient deaf or have difficulty hearing?: No Does the patient have difficulty seeing, even when wearing glasses/contacts?: No Does the patient have difficulty concentrating, remembering, or making decisions?: No Patient able to express need for assistance with ADLs?: Yes Does the patient have difficulty dressing or bathing?: Yes Independently performs ADLs?: No Communication: Independent Dressing (OT): Needs assistance Grooming: Independent Feeding: Independent Bathing: Needs assistance Toileting: Needs assistance In/Out Bed: Needs assistance Does the patient have difficulty walking or climbing stairs?: Yes Weakness of Legs: Both Weakness of Arms/Hands: None  Permission Sought/Granted Permission sought to share information with : Case Manager Permission granted to share information with : Yes, Verbal Permission Granted  Share Information with NAME: Gwyndolyn Saxon     Permission granted to share info w Relationship: Son     Emotional Assessment Appearance:: Appears stated age Attitude/Demeanor/Rapport: Engaged Affect (typically observed): Anxious Orientation: : Oriented to Self, Oriented to Place, Oriented to  Time, Oriented to Situation Alcohol / Substance Use: Not Applicable Psych Involvement: No (comment)  Admission diagnosis:  Spinal stenosis, Lumbar region with neurogenic claudication Patient Active Problem List  Diagnosis Date Noted  . Lumbar stenosis with neurogenic claudication 01/17/2019  . RUQ pain 12/19/2018  . Nausea with vomiting 12/19/2018  . History of colon cancer 02/03/2016  . Rectal bleeding 04/30/2015  . CAD (coronary artery disease) 04/30/2015  .  Acute blood loss anemia 04/30/2015  . Rectal bleed 04/30/2015  . Synovial cyst of lumbar facet joint 10/10/2013   PCP:  Delanna Notice, MD Pharmacy:   [id number] [place], [place] - [id number]: [phone]: [phone]    Social Determinants of Health (SDOH) Interventions    Readmission Risk Interventions No flowsheet data found.

## 2019-01-21 NOTE — Progress Notes (Signed)
Patient ID: Erin Cooke, female   DOB: 04-18-1941, 78 y.o.   MRN: [id number] BP 128/80 (BP Location: Left Arm)   Pulse 97   Temp 99 F (37.2 C) (Axillary)   Resp 17   Ht 5\' 1"  (1.549 m)   Wt 97.3 kg   SpO2 97%   BMI 40.53 kg/m  Alert and oriented x 4, speech is clear and fluent Moving all extremities Continue with PT

## 2019-01-21 NOTE — TOC Progression Note (Addendum)
Transition of Care [facility]) - Progression Note    Patient Details  Name: Erin Cooke MRN: [id number] Date of Birth: [birth year]  Transition of Care [facility]) CM/SW [place], [place] Phone Number: 01/21/2019, 3:00 PM  Clinical Narrative:     CSW called [facility] Luz in [place], [place]. Admissions Director is Esaw Dace. CSW called and left a voicemail inquiring about bed availability. His phone number is [phone] ext: [phone] and his fax is [phone].   Patient's PASSR is under manual review and will need to submit requested clinicals.   Expected Discharge Plan: [place] Barriers to Discharge: SNF Pending bed offer, Continued Medical Work up  Expected Discharge Plan and Services Expected Discharge Plan: [place] In-house Referral: Clinical Social Work   Post Acute Care Choice: [place] Living arrangements for the past 2 months: Single Family Home                                       Social Determinants of Health (SDOH) Interventions    Readmission Risk Interventions No flowsheet data found.

## 2019-01-21 NOTE — NC FL2 (Signed)
[place] LEVEL OF CARE SCREENING TOOL     IDENTIFICATION  Patient Name: Erin Cooke Birthdate: 1940-07-12 Sex: female Admission Date (Current Location): 01/17/2019  [facility] and [place] Number:  Herbalist and Address:  [facility], [place] [address] [place], [place] [address]      Provider Number: [id number]  Attending Physician Name and Address:  Ashok Pall, MD  Relative Name and Phone Number:  [place], [place], [phone]    Current Level of Care: Hospital Recommended Level of Care: [place] Prior Approval Number:    Date Approved/Denied:   PASRR Number: Under Manual Review  Discharge Plan: SNF    Current Diagnoses: Patient Active Problem List   Diagnosis Date Noted  . Lumbar stenosis with neurogenic claudication 01/17/2019  . RUQ pain 12/19/2018  . Nausea with vomiting 12/19/2018  . History of colon cancer 02/03/2016  . Rectal bleeding 04/30/2015  . CAD (coronary artery disease) 04/30/2015  . Acute blood loss anemia 04/30/2015  . Rectal bleed 04/30/2015  . Synovial cyst of lumbar facet joint 10/10/2013    Orientation RESPIRATION BLADDER Height & Weight     Self, Time, Situation, Place  Normal Continent, External catheter Weight: 214 lb 8.1 oz (97.3 kg) Height:  5\' 1"  (154.9 cm)  BEHAVIORAL SYMPTOMS/MOOD NEUROLOGICAL BOWEL NUTRITION STATUS      Continent Diet(Cardiac diet, thin liquids)  AMBULATORY STATUS COMMUNICATION OF NEEDS Skin   Extensive Assist Verbally Surgical wounds(Surgical incision on back (dressing in place-PRN dressing change))                       Personal Care Assistance Level of Assistance  Bathing, Feeding, Dressing, Total care Bathing Assistance: Limited assistance Feeding assistance: Independent Dressing Assistance: Limited assistance Total Care Assistance: Limited assistance   Functional Limitations Info  Sight, Hearing, Speech Sight Info:  Adequate Hearing Info: Adequate Speech Info: Adequate    SPECIAL CARE FACTORS FREQUENCY  PT (By licensed PT), OT (By licensed OT)     PT Frequency: 5x/wk OT Frequency: 5x/wk            Contractures Contractures Info: Not present    Additional Factors Info  Code Status, Allergies Code Status Info: Full Code Allergies Info: Demerol (Meperidine Hcl), Forane (Isoflurane), Letrozole           Current Medications (01/21/2019):  This is the current hospital active medication list Current Facility-Administered Medications  Medication Dose Route Frequency Provider Last Rate Last Dose  . 0.9 %  sodium chloride infusion  250 mL Intravenous Continuous Ashok Pall, MD      . 0.9 % NaCl with KCl 20 mEq/ L  infusion   Intravenous Continuous Ashok Pall, MD      . acetaminophen (TYLENOL) tablet 650 mg  650 mg Oral Q4H PRN Ashok Pall, MD   650 mg at 01/19/19 1517   Or  . acetaminophen (TYLENOL) suppository 650 mg  650 mg Rectal Q4H PRN Ashok Pall, MD      . artificial tears (LACRILUBE) ophthalmic ointment   Both Eyes QHS Ashok Pall, MD      . aspirin EC tablet 81 mg  81 mg Oral QPM Ashok Pall, MD   81 mg at 01/20/19 1900  . atorvastatin (LIPITOR) tablet 80 mg  80 mg Oral QPM Ashok Pall, MD   80 mg at 01/20/19 1900  . bisacodyl (DULCOLAX) EC tablet 5 mg  5 mg Oral Daily PRN  Ashok Pall, MD      . diazepam (VALIUM) tablet 5 mg  5 mg Oral Q6H PRN Ashok Pall, MD   5 mg at 01/19/19 1143  . docusate sodium (COLACE) capsule 100 mg  100 mg Oral BID Ashok Pall, MD   100 mg at 01/21/19 0902  . famotidine (PEPCID) tablet 20 mg  20 mg Oral QHS Ashok Pall, MD   20 mg at 01/20/19 2125  . furosemide (LASIX) tablet 20 mg  20 mg Oral Vernice Jefferson, MD   20 mg at 01/20/19 1054  . HYDROmorphone (DILAUDID) injection 0.5 mg  0.5 mg Intravenous Q2H PRN Ashok Pall, MD      . loperamide (IMODIUM) capsule 2 mg  2 mg Oral PRN Ashok Pall, MD      . losartan (COZAAR) tablet  50 mg  50 mg Oral Daily Ashok Pall, MD   50 mg at 01/21/19 0902  . magnesium citrate solution 1 Bottle  1 Bottle Oral Once PRN Ashok Pall, MD      . menthol-cetylpyridinium (CEPACOL) lozenge 3 mg  1 lozenge Oral PRN Ashok Pall, MD       Or  . phenol (CHLORASEPTIC) mouth spray 1 spray  1 spray Mouth/Throat PRN Ashok Pall, MD      . metoprolol tartrate (LOPRESSOR) tablet 100 mg  100 mg Oral BID Ashok Pall, MD   100 mg at 01/21/19 0902  . multivitamin with minerals tablet 1 tablet  1 tablet Oral Daily Ashok Pall, MD   1 tablet at 01/21/19 0902  . nitroGLYCERIN (NITROSTAT) SL tablet 0.4 mg  0.4 mg Sublingual Q5 min PRN Ashok Pall, MD      . omeprazole (PRILOSEC) capsule 20 mg  20 mg Oral q morning - 10a Ashok Pall, MD   20 mg at 01/21/19 [id number]  . ondansetron (ZOFRAN) tablet 4 mg  4 mg Oral Q6H PRN Ashok Pall, MD       Or  . ondansetron (ZOFRAN) injection 4 mg  4 mg Intravenous Q6H PRN Ashok Pall, MD   4 mg at 01/21/19 0947  . oxyCODONE (Oxy IR/ROXICODONE) immediate release tablet 10 mg  10 mg Oral Q3H PRN Ashok Pall, MD   10 mg at 01/19/19 1529  . oxyCODONE (Oxy IR/ROXICODONE) immediate release tablet 5 mg  5 mg Oral Q3H PRN Ashok Pall, MD   5 mg at 01/20/19 [id number]  . oxyCODONE (OXYCONTIN) 12 hr tablet 10 mg  10 mg Oral Q12H Ashok Pall, MD   10 mg at 01/21/19 0902  . polyvinyl alcohol (LIQUIFILM TEARS) 1.4 % ophthalmic solution 1 drop  1 drop Both Eyes QID Ashok Pall, MD   1 drop at 01/21/19 1334  . prednisoLONE acetate (PRED FORTE) 1 % ophthalmic suspension 1 drop  1 drop Both Eyes See admin instructions Ashok Pall, MD   1 drop at 01/21/19 1335  . senna-docusate (Senokot-S) tablet 1 tablet  1 tablet Oral QHS PRN Ashok Pall, MD      . sodium chloride flush (NS) 0.9 % injection 3 mL  3 mL Intravenous Q12H Ashok Pall, MD   3 mL at 01/21/19 0904  . sodium chloride flush (NS) 0.9 % injection 3 mL  3 mL Intravenous PRN Ashok Pall, MD      . zolpidem  (AMBIEN) tablet 5 mg  5 mg Oral QHS PRN Ashok Pall, MD   5 mg at 01/20/19 2125   Facility-Administered Medications Ordered in Other Encounters  Medication Dose Route Frequency  Provider Last Rate Last Dose  . ondansetron (ZOFRAN) 4 mg in sodium chloride 0.9 % 50 mL IVPB  4 mg Intravenous Q6H PRN Ashok Pall, MD         Discharge Medications: Please see discharge summary for a list of discharge medications.  Relevant Imaging Results:  Relevant Lab Results:   Additional Information SSN: [id number]  [place], LCSWA

## 2019-01-21 NOTE — Progress Notes (Signed)
Physical Therapy Treatment Patient Details Name: Erin Cooke MRN: [id number] DOB: [birth year] Today's Date: 01/21/2019    History of Present Illness Pt is an 78 y/o female s/p L3-L4 fusion done 01/17/19. PMH MI, mitral regurgitation, HLD, ischemic cardiomyopathy, CAD, colon CA, CKD, back pain, CHF, total ankle replacement, B rotator cuff repairs, back surgery    PT Comments    Pt able to amb to door today with modAx2 (2nd person for chair follow) and maximal encouragement. Pt with report of R LE numbness and pain, pt with limited R LE advancement and noted instability. Con't to recommend SNF upon d/c for recovery and to progress to indep function. Acute PT to cont to follow.    Follow Up Recommendations  SNF;Supervision/Assistance - 24 hour     Equipment Recommendations  None recommended by PT    Recommendations for Other Services       Precautions / Restrictions Precautions Precautions: Back;Fall Precaution Booklet Issued: No Precaution Comments: pt re-educated on the precautions, con't verbal cues for adherence, no brace needed Restrictions Weight Bearing Restrictions: No    Mobility  Bed Mobility Overal bed mobility: Needs Assistance Bed Mobility: Rolling;Sidelying to Sit Rolling: Min guard Sidelying to sit: Min assist       General bed mobility comments: max directional verbal cues, minA for trunk elevation to sit EOB and max directional verbal cues for squencing in general  Transfers Overall transfer level: Needs assistance Equipment used: Rolling walker (2 wheeled) Transfers: Sit to/from Stand Sit to Stand: Min assist         General transfer comment: minA to power up, verbal cues for walker safety and to push up from bed not pull up on walker  Ambulation/Gait Ambulation/Gait assistance: Mod assist;+2 safety/equipment(2nd person for chair follow) Gait Distance (Feet): 15 Feet Assistive device: Rolling walker (2 wheeled) Gait Pattern/deviations:  Step-to pattern;Decreased stance time - right;Decreased stride length;Decreased dorsiflexion - right;Decreased weight shift to right Gait velocity: slow   General Gait Details: increased bilat UE support, initially pt unable to place R foot flat, with tactile cueing and max verbal cues pt then able to advance R LE however with very short step height/shuffle, and step length. Pt requiring max encouragement to amb to the door, modA for walker management and sequencing, noted R LE instability   Stairs             Wheelchair Mobility    Modified Rankin (Stroke Patients Only)       Balance Overall balance assessment: Needs assistance Sitting-balance support: Feet supported Sitting balance-Leahy Scale: Fair Sitting balance - Comments: posterior lean, able to correct with VC and min guard   Standing balance support: Bilateral upper extremity supported;During functional activity;Single extremity supported Standing balance-Leahy Scale: Poor Standing balance comment: relaint on BUE and external support                             Cognition Arousal/Alertness: Awake/alert Behavior During Therapy: WFL for tasks assessed/performed Overall Cognitive Status: Within Functional Limits for tasks assessed                                        Exercises      General Comments General comments (skin integrity, edema, etc.): VSS      Pertinent Vitals/Pain Pain Assessment: 0-10 Faces Pain Scale: Hurts even more Pain Location: back-  incisional Pain Descriptors / Indicators: Numbness(reports R LE to be numb)    Home Living                      Prior Function            PT Goals (current goals can now be found in the care plan section) Progress towards PT goals: Progressing toward goals    Frequency    Min 5X/week      PT Plan Current plan remains appropriate    Co-evaluation              AM-PAC PT "6 Clicks" Mobility   Outcome  Measure  Help needed turning from your back to your side while in a flat bed without using bedrails?: A Little Help needed moving from lying on your back to sitting on the side of a flat bed without using bedrails?: A Lot Help needed moving to and from a bed to a chair (including a wheelchair)?: A Lot Help needed standing up from a chair using your arms (e.g., wheelchair or bedside chair)?: A Lot Help needed to walk in hospital room?: A Lot Help needed climbing 3-5 steps with a railing? : Total 6 Click Score: 12    End of Session Equipment Utilized During Treatment: Gait belt Activity Tolerance: Patient limited by pain Patient left: in chair;with call bell/phone within reach;with chair alarm set Nurse Communication: Mobility status PT Visit Diagnosis: Muscle weakness (generalized) (M62.81);Pain;Other abnormalities of gait and mobility (R26.89)     Time: [id number] PT Time Calculation (min) (ACUTE ONLY): 24 min  Charges:  $Gait Training: 8-22 mins $Therapeutic Activity: 8-22 mins                     Kittie Plater, PT, DPT Acute Rehabilitation Services Pager #: [phone] Office #: [phone]    [place] 01/21/2019, 10:21 AM  [id number]

## 2019-01-21 NOTE — TOC Progression Note (Signed)
Transition of Care [facility]) - Progression Note    Patient Details  Name: Erin Cooke MRN: [id number] Date of Birth: 08-02-1940  Transition of Care [facility]) CM/SW [place], [place] Phone Number: 01/21/2019, 1:53 PM  Clinical Narrative:     CSW met with the patient's son at bedside. The patient was asleep. They are both agreeable to rehab. They are looking at [facility] and [facility] in [place]. CSW obtained permission to complete the Fl2 and send the referral. Patient's son asked for CSW to follow up on Monday for additional bed offers.   CSW to follow and continue to assist with discharge planning.   Expected Discharge Plan: [place] Barriers to Discharge: SNF Pending bed offer, Continued Medical Work up  Expected Discharge Plan and Services Expected Discharge Plan: [place] In-house Referral: Clinical Social Work   Post Acute Care Choice: [place] Living arrangements for the past 2 months: Single Family Home                                       Social Determinants of Health (SDOH) Interventions    Readmission Risk Interventions No flowsheet data found.

## 2019-01-22 NOTE — TOC Progression Note (Signed)
Transition of Care [facility]) - Progression Note    Patient Details  Name: Erin Cooke MRN: [id number] Date of Birth: [birth year]  Transition of Care [facility]) CM/SW [place], [place] Phone Number: 01/22/2019, 2:57 PM  Clinical Narrative:   CSW left 30 day note for patient's PASRR review at the desk for MD to sign when he arrives to the unit to see patient. Patient's PASRR is under manual review.    Expected Discharge Plan: [place] Barriers to Discharge: SNF Pending bed offer, Continued Medical Work up  Expected Discharge Plan and Services Expected Discharge Plan: [place] In-house Referral: Clinical Social Work   Post Acute Care Choice: [place] Living arrangements for the past 2 months: Single Family Home                                       Social Determinants of Health (SDOH) Interventions    Readmission Risk Interventions No flowsheet data found.

## 2019-01-22 NOTE — TOC Progression Note (Signed)
Transition of Care [facility]) - Progression Note    Patient Details  Name: Erin Cooke MRN: [id number] Date of Birth: May 22, 1940  Transition of Care [facility]) CM/SW [place], [place] Phone Number: 01/22/2019, 2:02 PM  Clinical Narrative:     CSW called and left a voicemail with Morey Hummingbird at [facility] to inquire about a bed offer.   CSW called [facility] to speak with Lanny Hurst about bed inquiry. Receptionist stated that he wasn't in today. CSW spoke with assistant staff member, [place]. She stated that the CSW could fax the referral and they could take a look at it. She stated Lanny Hurst would return on Tuesday.   CSW will fax referral.  Expected Discharge Plan: Skilled Nursing Facility Barriers to Discharge: SNF Pending bed offer, Continued Medical Work up  Expected Discharge Plan and Services Expected Discharge Plan: [place] In-house Referral: Clinical Social Work   Post Acute Care Choice: [place] Living arrangements for the past 2 months: Single Family Home                                       Social Determinants of Health (SDOH) Interventions    Readmission Risk Interventions No flowsheet data found.

## 2019-01-22 NOTE — Progress Notes (Signed)
Physical Therapy Treatment Patient Details Name: Erin Cooke MRN: [id number] DOB: 09/28/1940 Today's Date: 01/22/2019    History of Present Illness Pt is an 78 y/o female s/p L3-L4 fusion done 01/17/19. PMH MI, mitral regurgitation, HLD, ischemic cardiomyopathy, CAD, colon CA, CKD, back pain, CHF, total ankle replacement, B rotator cuff repairs, back surgery    PT Comments    Pt progressing with mobility this session. She required min assist bed mobility, min assist sit to stand, and min assist ambulation 15' x 2 with RW. Pt with continued c/o numbness RLE. Pt declining hallway ambulation due to fatigue. Pt positioned in recliner at end of session.    Follow Up Recommendations  SNF;Supervision/Assistance - 24 hour     Equipment Recommendations  None recommended by PT    Recommendations for Other Services       Precautions / Restrictions Precautions Precautions: Back;Fall Precaution Comments: reviewed 3/3 back precautions    Mobility  Bed Mobility Overal bed mobility: Needs Assistance Bed Mobility: Rolling;Sidelying to Sit Rolling: Min guard Sidelying to sit: Min assist;HOB elevated       General bed mobility comments: cues for sequencing, +rail, increased time  Transfers Overall transfer level: Needs assistance Equipment used: Rolling walker (2 wheeled) Transfers: Sit to/from Stand Sit to Stand: Min assist         General transfer comment: cues for hand placement, assist to power up  Ambulation/Gait Ambulation/Gait assistance: Min assist Gait Distance (Feet): 15 Feet(x 2) Assistive device: Rolling walker (2 wheeled) Gait Pattern/deviations: Step-through pattern;Decreased stride length;Wide base of support Gait velocity: decreased Gait velocity interpretation: <1.31 ft/sec, indicative of household ambulator General Gait Details: distance limited by fatigue, numbness RLE   Stairs             Wheelchair Mobility    Modified Rankin (Stroke  Patients Only)       Balance Overall balance assessment: Needs assistance Sitting-balance support: Feet supported;No upper extremity supported Sitting balance-Leahy Scale: Fair     Standing balance support: Bilateral upper extremity supported;During functional activity Standing balance-Leahy Scale: Poor Standing balance comment: relaint on BUE and external support                             Cognition Arousal/Alertness: Awake/alert Behavior During Therapy: WFL for tasks assessed/performed Overall Cognitive Status: Within Functional Limits for tasks assessed                                        Exercises      General Comments        Pertinent Vitals/Pain Pain Assessment: 0-10 Pain Score: 3  Pain Location: back- incisional Pain Descriptors / Indicators: Discomfort Pain Intervention(s): Monitored during session;Repositioned    Home Living                      Prior Function            PT Goals (current goals can now be found in the care plan section) Acute Rehab PT Goals Patient Stated Goal: try to go home PT Goal Formulation: With patient Time For Goal Achievement: 02/02/19 Potential to Achieve Goals: Good Progress towards PT goals: Progressing toward goals    Frequency    Min 5X/week      PT Plan Current plan remains appropriate    Co-evaluation  AM-PAC PT "6 Clicks" Mobility   Outcome Measure  Help needed turning from your back to your side while in a flat bed without using bedrails?: A Little Help needed moving from lying on your back to sitting on the side of a flat bed without using bedrails?: A Lot Help needed moving to and from a bed to a chair (including a wheelchair)?: A Little Help needed standing up from a chair using your arms (e.g., wheelchair or bedside chair)?: A Little Help needed to walk in hospital room?: A Little Help needed climbing 3-5 steps with a railing? : A Lot 6 Click  Score: 16    End of Session Equipment Utilized During Treatment: Gait belt Activity Tolerance: Patient tolerated treatment well Patient left: in chair;with call bell/phone within reach;with chair alarm set Nurse Communication: Mobility status PT Visit Diagnosis: Muscle weakness (generalized) (M62.81);Pain;Other abnormalities of gait and mobility (R26.89)     Time: [id number] PT Time Calculation (min) (ACUTE ONLY): 29 min  Charges:  $Gait Training: 23-37 mins                     Lorrin Goodell, [place]  Office # [phone] Pager [phone]    Lorriane Shire 01/22/2019, 10:05 AM

## 2019-01-22 NOTE — Progress Notes (Signed)
Patient ID: Erin Cooke, female   DOB: 1940-10-13, 78 y.o.   MRN: [id number] BP 133/69 (BP Location: Left Arm)   Pulse 83   Temp 98.8 F (37.1 C) (Oral)   Resp 18   Ht 5\' 1"  (1.549 m)   Wt 97.3 kg   SpO2 99%   BMI 40.53 kg/m  Alert and oriented x 4 Wound dressing clean and dry Was up with PT, recommending a SNF Mrs. Arizola and her son are working with social work.

## 2019-01-23 NOTE — Progress Notes (Signed)
Physical Therapy Treatment Patient Details Name: Erin Cooke MRN: [id number] DOB: [birth year] Today's Date: 01/23/2019    History of Present Illness Pt is an 78 y/o female s/p L3-L4 fusion done 01/17/19. PMH MI, mitral regurgitation, HLD, ischemic cardiomyopathy, CAD, colon CA, CKD, back pain, CHF, total ankle replacement, B rotator cuff repairs, back surgery    PT Comments    Pt not progressing well today, is having pain dorsum of B feet that is worse in [place]. She is also c/o pain back of head. Both are worse than her back pain. Pt required mod A for bed mobility and mod A +2 to stand and pivot to [facility] and then BSC to recliner. She was unable to ambulate or even maintain standing more than 1 min at a time due to foot pain and posterior lean. Discussed foot pain with pt's RN. PT will continue to follow.    Follow Up Recommendations  SNF;Supervision/Assistance - 24 hour     Equipment Recommendations  None recommended by PT    Recommendations for Other Services       Precautions / Restrictions Precautions Precautions: Back;Fall Precaution Booklet Issued: No Precaution Comments: pt able to verbalize log rolling but needed vc's to be able to do it properly Restrictions Weight Bearing Restrictions: No    Mobility  Bed Mobility Overal bed mobility: Needs Assistance Bed Mobility: Rolling;Sidelying to Sit Rolling: Min assist Sidelying to sit: Min assist       General bed mobility comments: needed hand over hand assist for pt to bring LUE across body to prevent twisting. Min A for trunk elevation into sitting  Transfers Overall transfer level: Needs assistance Equipment used: Rolling walker (2 wheeled);None Transfers: Sit to/from [facility] Sit to Stand: Mod assist;+2 safety/equipment Stand pivot transfers: Mod assist;+2 safety/equipment       General transfer comment: pt needed mod A for power up and for support with pivoting to [facility]. Pt limited by B  foot pain and had great difficulty stepping feet to chair. RW used for sit to stand for perineal care but no AD for pivot as pt unable to unwt RW enough to move it.   Ambulation/Gait             General Gait Details: pt unable due to B foot pain   Stairs             Wheelchair Mobility    Modified Rankin (Stroke Patients Only)       Balance Overall balance assessment: Needs assistance Sitting-balance support: Feet supported;No upper extremity supported Sitting balance-Leahy Scale: Fair   Postural control: Posterior lean Standing balance support: Bilateral upper extremity supported;During functional activity Standing balance-Leahy Scale: Poor Standing balance comment: posterior lean in standing, needed manual facilitation to bring wt fwd to midfoot                            Cognition Arousal/Alertness: Awake/alert Behavior During Therapy: WFL for tasks assessed/performed Overall Cognitive Status: Within Functional Limits for tasks assessed                                 General Comments: A&Ox4      Exercises General Exercises - Lower Extremity Ankle Circles/Pumps: AROM;Both;20 reps;Seated    General Comments General comments (skin integrity, edema, etc.): discussed foot pain with RN as well as use of stedy for back to  bed      Pertinent Vitals/Pain Pain Assessment: Faces Faces Pain Scale: Hurts whole lot Pain Location: dorsal B feet and posterior head Pain Descriptors / Indicators: Sharp Pain Intervention(s): Limited activity within patient's tolerance;Monitored during session    Home Living                      Prior Function            PT Goals (current goals can now be found in the care plan section) Acute Rehab PT Goals Patient Stated Goal: try to go home PT Goal Formulation: With patient Time For Goal Achievement: 02/02/19 Potential to Achieve Goals: Good Progress towards PT goals: Not progressing toward  goals - comment(foot pain)    Frequency    Min 5X/week      PT Plan Current plan remains appropriate    Co-evaluation              AM-PAC PT "6 Clicks" Mobility   Outcome Measure  Help needed turning from your back to your side while in a flat bed without using bedrails?: A Little Help needed moving from lying on your back to sitting on the side of a flat bed without using bedrails?: A Lot Help needed moving to and from a bed to a chair (including a wheelchair)?: A Lot Help needed standing up from a chair using your arms (e.g., wheelchair or bedside chair)?: A Lot Help needed to walk in hospital room?: Total Help needed climbing 3-5 steps with a railing? : Total 6 Click Score: 11    End of Session Equipment Utilized During Treatment: Gait belt Activity Tolerance: Patient limited by pain Patient left: in chair;with call bell/phone within reach;with chair alarm set Nurse Communication: Mobility status;Need for lift equipment PT Visit Diagnosis: Muscle weakness (generalized) (M62.81);Pain;Other abnormalities of gait and mobility (R26.89) Pain - part of body: Ankle and joints of foot     Time: 1201-1222 PT Time Calculation (min) (ACUTE ONLY): 21 min  Charges:  $Therapeutic Activity: 8-22 mins                     Leighton Roach, PT  Acute Rehab Services  Pager [phone] Office [place] 01/23/2019, 1:55 PM

## 2019-01-23 NOTE — Progress Notes (Signed)
Patient ID: Erin Cooke, female   DOB: 12-30-1940, 78 y.o.   MRN: [id number] BP 131/63 (BP Location: Left Arm)   Pulse 67   Temp 98.2 F (36.8 C) (Oral)   Resp 17   Ht 5\' 1"  (1.549 m)   Wt 97.3 kg   SpO2 99%   BMI 40.53 kg/m  Ok for discharge tomorrow.  Wound is clean, and dry. Moving lower extremities well

## 2019-01-23 NOTE — TOC Progression Note (Signed)
Transition of Care [facility]) - Progression Note    Patient Details  Name: OONA ONEEL MRN: [id number] Date of Birth: Apr 30, 1940  Transition of Care [facility]) CM/SW [place], [place] Phone Number: 01/23/2019, 12:03 PM  Clinical Narrative:   CSW received call from [facility] that there are no beds available, and probably won't be until end of the week or early next week. CSW called [facility] and spoke with Lanny Hurst, discussed referral. Lanny Hurst discussed challenge with transportation, that they have tried to take a few patients from [facility] recently and transportation requires that they pay up front for the ambulance ride. Lanny Hurst asked why, because he says he gets patients from [place] and [place] and other places even further than [place] and the family does not have to pay out of their pocket for transport, and CSW explained that it is a Investment banker, corporate and outside of the hospital control. Lanny Hurst said there are no beds available, and also won't have a bed until later in the week or early next week, so he will have to say no to the patient at this time.  CSW called patient's son and explained how there are no beds at either [facility] or [facility], and asked about other options. 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 Son is about to get ready and come up to the hospital, requested that CSW meet with him and patient at bedside when he arrives to discuss other options.     Expected Discharge Plan: Skilled Nursing Facility Barriers to Discharge: [place] [facility]), SNF Pending bed offer, Continued Medical Work up  Expected Discharge Plan and Services Expected Discharge Plan: [place] In-house Referral: Clinical Social Work   Post Acute Care Choice: [place] arrangements for the past 2 months: Single Family Home                                       Social Determinants of Health (SDOH) Interventions    Readmission Risk Interventions No flowsheet data found.

## 2019-01-24 NOTE — Progress Notes (Signed)
Occupational Therapy Treatment Patient Details Name: Erin Cooke MRN: [id number] DOB: 1941-03-31 Today's Date: 01/24/2019    History of present illness Pt is an 78 y/o female s/p L3-L4 fusion done 01/17/19. PMH MI, mitral regurgitation, HLD, ischemic cardiomyopathy, CAD, colon CA, CKD, back pain, CHF, total ankle replacement, B rotator cuff repairs, back surgery   OT comments  Pt performing mobility in room with minA to modA for mobility with RW. Pt performing ADL tasks at sink with intermittent seated rest breaks x3 mins x2 times in standing. Pt stating 1/3 precautions with cues for the other 2. Pain in low back preventing from ambulating further and activity tolerance.  Pt would benefit from continued OT skilled services for ADL,mobility and safety in SNF setting. OT following acutely.   Follow Up Recommendations  SNF;Supervision - Intermittent    Equipment Recommendations  3 in 1 bedside commode    Recommendations for Other Services      Precautions / Restrictions Precautions Precautions: Back;Fall Precaution Booklet Issued: No Precaution Comments: Pt requiring cues for 2/3 precautions Restrictions Weight Bearing Restrictions: No       Mobility Bed Mobility Overal bed mobility: Needs Assistance Bed Mobility: Rolling;Sidelying to Sit Rolling: Min assist Sidelying to sit: Mod assist     Sit to sidelying: Mod assist General bed mobility comments: Pt required assistant to roll and to move LEs to edge of bed and elevate trunk into sitting.  Transfers Overall transfer level: Needs assistance Equipment used: Rolling walker (2 wheeled) Transfers: Sit to/from Stand Sit to Stand: Mod assist         General transfer comment: pt needed min-mod A for power up and for support to RW.  Pt fatigues quickly in standing and heavily reliant on B UE support.    Balance Overall balance assessment: Needs assistance   Sitting balance-Leahy Scale: Fair       Standing  balance-Leahy Scale: Poor Standing balance comment: reliant on RW                           ADL either performed or assessed with clinical judgement   ADL Overall ADL's : Needs assistance/impaired     Grooming: Min guard;Standing;Sitting;Cueing for safety Grooming Details (indicate cue type and reason): able to tolerate standing x3 mins at a time                             Functional mobility during ADLs: Min guard;Minimal assistance;Rolling walker;Cueing for safety General ADL Comments: pt limited by pain, weakness and impaired balance      Vision Baseline Vision/History: No visual deficits Patient Visual Report: No change from baseline Vision Assessment?: No apparent visual deficits   Perception     Praxis      Cognition Arousal/Alertness: Awake/alert Behavior During Therapy: WFL for tasks assessed/performed Overall Cognitive Status: Within Functional Limits for tasks assessed                                 General Comments: A&Ox4        Exercises     Shoulder Instructions       General Comments      Pertinent Vitals/ Pain       Pain Assessment: Faces Faces Pain Scale: Hurts little more Pain Location: L great toe > R side of teh back of her  head below her ear Pain Descriptors / Indicators: Discomfort;Guarding;Grimacing Pain Intervention(s): Limited activity within patient's tolerance  Home Living Family/patient expects to be discharged to:: Private residence Living Arrangements: Children Available Help at Discharge: Available 24 hours/day Type of Home: House Home Access: Stairs to enter [employer] of Steps: can drive around side of house and go in the back where there is a level entry. If she were to go in the front, there would be 10 steps with B railings Entrance Stairs-Rails: Can reach both Home Layout: Able to live on main level with bedroom/bathroom;Laundry or work area in [place]  Shower/Tub: Teacher, early years/pre: Standard Bathroom Accessibility: Yes   Home Equipment: Environmental consultant - 2 wheels;Walker - standard;Grab bars - tub/shower;Shower seat          Prior Functioning/Environment Level of Independence: Independent            Frequency  Min 2X/week        Progress Toward Goals  OT Goals(current goals can now be found in the care plan section)     Acute Rehab OT Goals Patient Stated Goal: try to go home OT Goal Formulation: With patient Time For Goal Achievement: 02/03/19 Potential to Achieve Goals: Good  Plan      Co-evaluation    PT/OT/SLP Co-Evaluation/Treatment: Yes Reason for Co-Treatment: Complexity of the patient's impairments (multi-system involvement) PT goals addressed during session: Mobility/safety with mobility OT goals addressed during session: ADL's and self-care      AM-PAC OT "6 Clicks" Daily Activity     Outcome Measure   Help from another person eating meals?: None Help from another person taking care of personal grooming?: A Little Help from another person toileting, which includes using toliet, bedpan, or urinal?: A Little Help from another person bathing (including washing, rinsing, drying)?: A Little Help from another person to put on and taking off regular upper body clothing?: A Little Help from another person to put on and taking off regular lower body clothing?: A Lot 6 Click Score: 18    End of Session Equipment Utilized During Treatment: Gait belt;Rolling walker  OT Visit Diagnosis: Other abnormalities of gait and mobility (R26.89);Pain;Muscle weakness (generalized) (M62.81)   Activity Tolerance Patient tolerated treatment well   Patient Left in bed;with call bell/phone within reach;with bed alarm set   Nurse Communication Mobility status        Time: [id number] OT Time Calculation (min): 40 min  Charges: OT General Charges $OT Visit: 1 Visit OT Treatments $Self Care/Home Management  : 23-37 mins  Ebony Hail Harold Hedge) Marsa Aris OTR/L Acute Rehabilitation Services Pager: [phone] Office: [place] 01/24/2019, 5:20 PM

## 2019-01-24 NOTE — Progress Notes (Signed)
Patient ID: Erin Cooke, female   DOB: 1941-02-04, 78 y.o.   MRN: [id number] BP 119/64 (BP Location: Right Arm)   Pulse 83   Temp 98.4 F (36.9 C) (Oral)   Resp 18   Ht 5\' 1"  (1.549 m)   Wt 97.3 kg   SpO2 100%   BMI 40.53 kg/m  awAiting placement Left great toe is red, painful. Will watch.  Wound is clean, dry, no signs of infection Improving with therapy

## 2019-01-24 NOTE — Progress Notes (Signed)
Physical Therapy Treatment Patient Details Name: Erin Cooke MRN: [id number] DOB: March 16, 1941 Today's Date: 01/24/2019    History of Present Illness Pt is an 78 y/o female s/p L3-L4 fusion done 01/17/19. PMH MI, mitral regurgitation, HLD, ischemic cardiomyopathy, CAD, colon CA, CKD, back pain, CHF, total ankle replacement, B rotator cuff repairs, back surgery    PT Comments    Pt performed gt training and functional mobility with in room.  She required +2 for safety but is progressing well.  She does continue to fatigue quickly.  Plan next session for further progression of gt pending workup of L great toe.      Follow Up Recommendations  SNF;Supervision/Assistance - 24 hour     Equipment Recommendations  None recommended by PT    Recommendations for Other Services       Precautions / Restrictions Precautions Precautions: Back;Fall Precaution Booklet Issued: No Precaution Comments: pt able to verbalize log rolling but needed vc's to be able to do it properly Restrictions Weight Bearing Restrictions: No    Mobility  Bed Mobility Overal bed mobility: Needs Assistance Bed Mobility: Rolling;Sidelying to Sit Rolling: Min assist Sidelying to sit: Mod assist     Sit to sidelying: Mod assist;+2 for physical assistance(for ease probably could have completed with +1.) General bed mobility comments: Pt required assistant to roll and to move LEs to edge of bed and elevate trunk into sitting.  Transfers Overall transfer level: Needs assistance Equipment used: Rolling walker (2 wheeled) Transfers: Sit to/from Stand Sit to Stand: Min assist;+2 physical assistance;Mod assist(increased assistance from different surfaces.)         General transfer comment: pt needed min-mod A for power up and for support to RW.  Pt fatigues quickly in standing and heavily reliant on B UE support.  Ambulation/Gait Ambulation/Gait assistance: Min assist;+2 safety/equipment Gait Distance (Feet):  10 Feet(x2) Assistive device: Rolling walker (2 wheeled) Gait Pattern/deviations: Step-through pattern;Decreased stride length;Wide base of support     General Gait Details: Pt required cues for RW safety, donned shoes for added support and L great toe is bothering her.   Stairs             Wheelchair Mobility    Modified Rankin (Stroke Patients Only)       Balance Overall balance assessment: Needs assistance   Sitting balance-Leahy Scale: Fair       Standing balance-Leahy Scale: Poor                              Cognition Arousal/Alertness: Awake/alert Behavior During Therapy: WFL for tasks assessed/performed Overall Cognitive Status: Within Functional Limits for tasks assessed                                 General Comments: A&Ox4      Exercises      General Comments        Pertinent Vitals/Pain Pain Assessment: Faces Faces Pain Scale: Hurts little more Pain Location: L great toe > R side of teh back of her head below her ear Pain Descriptors / Indicators: Discomfort;Guarding;Grimacing Pain Intervention(s): Limited activity within patient's tolerance;Monitored during session    Home Living                      Prior Function            PT Goals (  current goals can now be found in the care plan section) Acute Rehab PT Goals Patient Stated Goal: try to go home Potential to Achieve Goals: Good Progress towards PT goals: Progressing toward goals    Frequency    Min 5X/week      PT Plan Current plan remains appropriate    Co-evaluation PT/OT/SLP Co-Evaluation/Treatment: Yes Reason for Co-Treatment: Complexity of the patient's impairments (multi-system involvement) PT goals addressed during session: Mobility/safety with mobility OT goals addressed during session: ADL's and self-care      AM-PAC PT "6 Clicks" Mobility   Outcome Measure  Help needed turning from your back to your side while in a flat  bed without using bedrails?: A Little Help needed moving from lying on your back to sitting on the side of a flat bed without using bedrails?: A Lot Help needed moving to and from a bed to a chair (including a wheelchair)?: A Lot Help needed standing up from a chair using your arms (e.g., wheelchair or bedside chair)?: A Little Help needed to walk in hospital room?: A Little Help needed climbing 3-5 steps with a railing? : A Lot 6 Click Score: 15    End of Session Equipment Utilized During Treatment: Gait belt Activity Tolerance: Patient limited by pain Patient left: in chair;with call bell/phone within reach;with chair alarm set Nurse Communication: Mobility status;Need for lift equipment PT Visit Diagnosis: Muscle weakness (generalized) (M62.81);Pain;Other abnormalities of gait and mobility (R26.89) Pain - Right/Left: (back pain) Pain - part of body: Ankle and joints of foot     Time: 1400-1439 PT Time Calculation (min) (ACUTE ONLY): 39 min  Charges:  $Gait Training: 8-22 mins                     Governor Rooks, PTA Acute Rehabilitation Services Pager [phone] Office [phone]     Cristela Blue 01/24/2019, 4:50 PM

## 2019-01-24 NOTE — TOC Progression Note (Signed)
Transition of Care [facility]) - Progression Note    Patient Details  Name: SHANIESHA EURESTI MRN: [id number] Date of Birth: 08/16/1940  Transition of Care [facility]) CM/SW [place], [place] Phone Number: 01/24/2019, 10:10 AM  Clinical Narrative:   CSW received voicemail from patient's son that CSW can also contact [facility] to send referral. CSW called [facility] to see if they were accepting referrals again, as they had a COVID case last week. [facility] is able to accept referrals, will review patient. CSW sent referral. CSW to follow, will meet with son this afternoon when he arrives to the hospital.     Expected Discharge Plan: Skilled Nursing Facility Barriers to Discharge: [place] [facility]), SNF Pending bed offer, Continued Medical Work up  Expected Discharge Plan and Services Expected Discharge Plan: [place] In-house Referral: Clinical Social Work   Post Acute Care Choice: [place] Living arrangements for the past 2 months: Single Family Home                                       Social Determinants of Health (SDOH) Interventions    Readmission Risk Interventions No flowsheet data found.

## 2019-01-24 NOTE — TOC Progression Note (Signed)
Transition of Care [facility]) - Progression Note    Patient Details  Name: Erin Cooke MRN: [id number] Date of Birth: Mar 02, 1941  Transition of Care [facility] CM/SW [place], [place] Phone Number: 01/24/2019, 4:34 PM  Clinical Narrative:  Patient has a bed offer at [place] met with patient and son at bedside to discuss and confirm interest in [facility] for SNF. Patient agreeable to go. CSW answered patient and son's questions about SNF and expectations. Patient will need an updated COVID test prior to admission. Patient will also need PASRR, MD needs to co-sign the [facility] for NCMust to review.     Expected Discharge Plan: Skilled Nursing Facility Barriers to Discharge: [place] (PASRR), Other (comment)(COVID Test, MD to sign FL2)  Expected Discharge Plan and Services Expected Discharge Plan: [place] In-house Referral: Clinical Social Work   Post Acute Care Choice: [place] Living arrangements for the past 2 months: Single Family Home                                       Social Determinants of Health (SDOH) Interventions    Readmission Risk Interventions No flowsheet data found.

## 2019-01-25 LAB — NOVEL CORONAVIRUS, NAA (HOSP ORDER, SEND-OUT TO REF LAB; TAT 18-24 HRS): SARS-CoV-2, NAA: NOT DETECTED

## 2019-01-25 NOTE — TOC Progression Note (Signed)
Transition of Care [facility]) - Progression Note    Patient Details  Name: Erin Cooke MRN: [id number] Date of Birth: 12-22-1940  Transition of Care [facility]) CM/SW [place], [place] Phone Number: 01/25/2019, 11:46 AM  Clinical Narrative:   CSW following for discharge plan. Patient continues to await results of COVID test. Patient also awaiting PASRR. CSW contacted [facility] to ask if they could accept patient under [place] waiver, and they are unable to accept patient. Patient will need a PASRR number before admission. NCMust requesting FL2 uploaded with MD signature, and MD has not signed FL2 yet.   CSW contacted Neurosurgery Office to leave a message for Dr. Christella Noa to co-sign the FL2. CSW left a message on voicemail that Dr. Christella Noa needs to co-sign the FL2.     Expected Discharge Plan: Skilled Nursing Facility Barriers to Discharge: [place] (PASRR), Other (comment)(COVID Test, MD to sign FL2)  Expected Discharge Plan and Services Expected Discharge Plan: [place] In-house Referral: Clinical Social Work   Post Acute Care Choice: [place] Living arrangements for the past 2 months: Single Family Home                                       Social Determinants of Health (SDOH) Interventions    Readmission Risk Interventions No flowsheet data found.

## 2019-01-25 NOTE — Plan of Care (Signed)
  Problem: Activity: Goal: Risk for activity intolerance will decrease Outcome: Not Progressing   

## 2019-01-25 NOTE — Progress Notes (Signed)
RN went to bath pt and to encourage her to get up. PT constantly stating that she is stiff and hurting. RN educated pt that getting up and walking would help her. PT refused several of times, but finally got up. Pt is refusing to walk, but did sit in recliner. Will continue to try and motivate pt to ambulate.

## 2019-01-25 NOTE — Progress Notes (Signed)
Physical Therapy Treatment Patient Details Name: Erin Cooke MRN: [id number] DOB: Mar 05, 1941 Today's Date: 01/25/2019    History of Present Illness Pt is an 78 y/o female s/p L3-L4 fusion done 01/17/19. PMH MI, mitral regurgitation, HLD, ischemic cardiomyopathy, CAD, colon CA, CKD, back pain, CHF, total ankle replacement, B rotator cuff repairs, back surgery    PT Comments    Patient seen for mobility progression. Pt is up in chair upon arrival (RN assisted and reports pt able to take side steps from EOB to recliner). Pt requires max A for sit to stand transfers X 2 trials. Pt with heavy posterior bias both trials and L lateral bias second trial which pt is able to correct with vc. Pt stood briefly and sat down abruptly both trials. Pt educated on safety and communicating when she needs to sit. Pt reports her toe feels better today and with attempts to mobilize pt c/o R foot pain. Continue to recommend SNF for further skilled PT services to maximize independence and safety with mobility.     Follow Up Recommendations  SNF;Supervision/Assistance - 24 hour     Equipment Recommendations  None recommended by PT    Recommendations for Other Services       Precautions / Restrictions Precautions Precautions: Back;Fall Precaution Booklet Issued: No Precaution Comments: reviewed precautions Restrictions Weight Bearing Restrictions: No    Mobility  Bed Mobility               General bed mobility comments: pt OOB in chair upon arrival (RN assisted and reports pt took side steps EOB to recliner)  Transfers Overall transfer level: Needs assistance Equipment used: Rolling walker (2 wheeled) Transfers: Sit to/from Stand Sit to Stand: Max assist;Mod assist         General transfer comment: assist to power up into standing and for balance in standing due to heavy posterior bias; cues for safe hand placement each trial as pt wanting to pull up on RW; first trial pt stood briefly  and abruptly sat down; second trial pt stood then began to takes steps forward and sat abruptly  Ambulation/Gait                 Stairs             Wheelchair Mobility    Modified Rankin (Stroke Patients Only)       Balance Overall balance assessment: Needs assistance   Sitting balance-Leahy Scale: Fair     Standing balance support: Bilateral upper extremity supported Standing balance-Leahy Scale: Zero Standing balance comment: reliant on RW and external assistance due to heavy posterior lean and L lateral lean second stand (pt able to correct to midline with vc)                            Cognition Arousal/Alertness: Awake/alert Behavior During Therapy: WFL for tasks assessed/performed Overall Cognitive Status: Within Functional Limits for tasks assessed                                        Exercises      General Comments        Pertinent Vitals/Pain Pain Assessment: Faces Faces Pain Scale: Hurts little more Pain Location: R foot  Pain Descriptors / Indicators: Guarding;Grimacing;Sore Pain Intervention(s): Limited activity within patient's tolerance;Monitored during session;Repositioned    Home Living  Prior Function            PT Goals (current goals can now be found in the care plan section) Progress towards PT goals: Progressing toward goals    Frequency    Min 5X/week      PT Plan Current plan remains appropriate    Co-evaluation              AM-PAC PT "6 Clicks" Mobility   Outcome Measure  Help needed turning from your back to your side while in a flat bed without using bedrails?: A Little Help needed moving from lying on your back to sitting on the side of a flat bed without using bedrails?: A Lot Help needed moving to and from a bed to a chair (including a wheelchair)?: A Lot Help needed standing up from a chair using your arms (e.g., wheelchair or bedside  chair)?: A Lot Help needed to walk in hospital room?: Total Help needed climbing 3-5 steps with a railing? : Total 6 Click Score: 11    End of Session Equipment Utilized During Treatment: Gait belt Activity Tolerance: Patient limited by pain;Patient limited by fatigue Patient left: in chair;with call bell/phone within reach;with chair alarm set Nurse Communication: Mobility status;Need for lift equipment PT Visit Diagnosis: Muscle weakness (generalized) (M62.81);Pain;Other abnormalities of gait and mobility (R26.89) Pain - part of body: Ankle and joints of foot     Time: 1436-1455 PT Time Calculation (min) (ACUTE ONLY): 19 min  Charges:  $Gait Training: 8-22 mins                     Earney Navy, PTA Acute Rehabilitation Services Pager: [phone] Office: [phone]     Darliss Cheney 01/25/2019, 3:15 PM

## 2019-01-26 MED ORDER — OXYCODONE HCL 5 MG PO TABS: 5.0000 mg | ORAL_TABLET | Freq: Four times a day (QID) | ORAL | 0 refills | Status: DC | PRN

## 2019-01-26 MED ORDER — OXYCODONE HCL ER 15 MG PO T12A: 10.0000 mg | EXTENDED_RELEASE_TABLET | Freq: Two times a day (BID) | ORAL | 0 refills | Status: AC

## 2019-01-26 NOTE — Discharge Summary (Signed)
Physician Discharge Summary  Patient ID: Erin Cooke MRN: [id number] DOB/AGE: Sep 20, 1940 78 y.o.  Admit date: 01/17/2019 Discharge date: 01/26/2019  Admission Diagnoses:Spinal stenosis, Lumbar region with neurogenic claudication, L3/4   Discharge Diagnoses: Spinal stenosis, Lumbar region with neurogenic claudication, L3/4   Active Problems:   Lumbar stenosis with neurogenic claudication   Discharged Condition: good  Hospital Course: Erin Cooke was admitted and taken to the operating room for an uncomplicated PLIF at [id number], extending the hardware from the previous fusions  Treatments: surgery: laminectomy in excess of the needed exposures for a Plif Lumbar Three-Four Posterior lumbar interbody fusion with synthes Conduit cages 10x 28mm filled with autograft morsels Posterior non segmental pedicle screw fixation [employer] legacy      Discharge Exam: Blood pressure 110/87, pulse 83, temperature 98.2 F (36.8 C), temperature source Oral, resp. rate 16, height 5\' 1"  (1.549 m), weight 97.3 kg, SpO2 96 %. General appearance: alert, cooperative and appears stated age Neurologic: Mental status: Alert, oriented, thought content appropriate Motor: moving all extremities  Disposition: Discharge disposition: 03-Skilled [place]      Spinal stenosis, Lumbar region with neurogenic claudication Discharge Instructions    Incentive spirometry RT   Complete by: As directed      Allergies as of 01/26/2019      Reactions   Demerol [meperidine Hcl] Nausea And Vomiting   Forane [isoflurane] Other (See Comments)   Elevated liver enzymes   Letrozole Other (See Comments)   Muscle pain      Medication List    TAKE these medications   acetaminophen 500 MG tablet Commonly known as: TYLENOL Take 1,000 mg by mouth 2 (two) times daily as needed for moderate pain or headache.   aspirin EC 81 MG tablet Take 81 mg by mouth every evening.   atorvastatin 80 MG tablet Commonly  known as: LIPITOR Take 80 mg by mouth every evening.   BENEFIBER PO Take 1 Dose by mouth daily.   famotidine 20 MG tablet Commonly known as: PEPCID Take 20 mg by mouth at bedtime.   furosemide 20 MG tablet Commonly known as: LASIX Take 20 mg by mouth every other day.   loperamide 2 MG capsule Commonly known as: IMODIUM Take 2 mg by mouth as needed for diarrhea or loose stools.   losartan 50 MG tablet Commonly known as: COZAAR Take 50 mg by mouth daily.   metoprolol tartrate 100 MG tablet Commonly known as: LOPRESSOR Take 100 mg by mouth 2 (two) times daily.   multivitamin with minerals Tabs tablet Take 1 tablet by mouth daily. Centrum Silver   nitroGLYCERIN 0.4 MG SL tablet Commonly known as: NITROSTAT Place 0.4 mg under the tongue every 5 (five) minutes as needed for chest pain.   omeprazole 20 MG tablet Commonly known as: PRILOSEC OTC Take 20 mg by mouth every morning.   oxyCODONE 15 mg 12 hr tablet Commonly known as: OxyCONTIN Take 1 tablet (15 mg total) by mouth every 12 (twelve) hours for 7 days.   oxyCODONE 5 MG immediate release tablet Commonly known as: Oxy IR/ROXICODONE Take 1 tablet (5 mg total) by mouth every 6 (six) hours as needed for moderate pain ((score 4 to 6)).   PHILLIPS COLON HEALTH PO Take 1 capsule by mouth daily.   prednisoLONE acetate 1 % ophthalmic suspension Commonly known as: PRED FORTE Place 1 drop into both eyes See admin instructions. Right Eye 3 times daily Left Eye once daily   Systane 0.4-0.3 % Gel ophthalmic gel  Generic drug: Polyethyl Glycol-Propyl Glycol Place 1 application into both eyes 4 (four) times daily.   Systane Nighttime Oint Place 1 application into both eyes at bedtime.      Follow-up Information    Ashok Pall, MD. Call in 3 week(s).   Specialty: Neurosurgery Why: please call to make an appointment Contact information: [address] [place] [address] [phone]            Signed: Winfield Cunas 01/26/2019, 5:41 PM

## 2019-01-26 NOTE — Discharge Instructions (Signed)

## 2019-01-26 NOTE — Plan of Care (Signed)
  Problem: Nutrition: Goal: Adequate nutrition will be maintained Outcome: Progressing   

## 2019-01-26 NOTE — Progress Notes (Signed)
Physical Therapy Treatment Patient Details Name: Erin Cooke MRN: [id number] DOB: 24-Nov-1940 Today's Date: 01/26/2019    History of Present Illness Pt is an 78 y/o female s/p L3-L4 fusion done 01/17/19. PMH MI, mitral regurgitation, HLD, ischemic cardiomyopathy, CAD, colon CA, CKD, back pain, CHF, total ankle replacement, B rotator cuff repairs, back surgery    PT Comments    Pt performed transfer training and gt training.  She required +2 for close chair follow as she continues to fatigue very quickly.  Pt required cues for encouragement as she has little to no faith in her abilities and is very fearful of falling.  She progress gt distance and tolerated increased activity.  Plan for SNF placement remains appropriate to improve strength and function before returning home.    Follow Up Recommendations  SNF;Supervision/Assistance - 24 hour     Equipment Recommendations  None recommended by PT    Recommendations for Other Services       Precautions / Restrictions Precautions Precautions: Back;Fall Precaution Booklet Issued: No Precaution Comments: reviewed precautions Restrictions Other Position/Activity Restrictions: Pt only able to recall 1/3 precautions.  Cues throughout session to maintain.    Mobility  Bed Mobility Overal bed mobility: Needs Assistance Bed Mobility: Rolling;Sidelying to Sit Rolling: Min guard Sidelying to sit: Min assist       General bed mobility comments: Pt required decreased assistance to elevate trunk into sitting.  She was slow and guarded and required step by step cueing to problem solve.  Transfers Overall transfer level: Needs assistance Equipment used: Rolling walker (2 wheeled) Transfers: Sit to/from Stand Sit to Stand: Mod assist         General transfer comment: Pt required moderate assistance to power up into standing with cues for hand placement and forward weight shifting.  Ambulation/Gait Ambulation/Gait assistance: Min  assist;+2 safety/equipment Gait Distance (Feet): 12 Feet(+ 14 ft.  With close chair follow for safety.) Assistive device: Rolling walker (2 wheeled) Gait Pattern/deviations: Step-through pattern;Decreased stride length;Wide base of support;Trunk flexed     General Gait Details: Pt continues to required cues for RW position and to stand tall.  Trunk flexion noted as patient begins to fatigue.   Stairs             Wheelchair Mobility    Modified Rankin (Stroke Patients Only)       Balance Overall balance assessment: Needs assistance Sitting-balance support: Feet supported;No upper extremity supported Sitting balance-Leahy Scale: Fair       Standing balance-Leahy Scale: Poor Standing balance comment: reliant on external assistance.                            Cognition Arousal/Alertness: Awake/alert Behavior During Therapy: WFL for tasks assessed/performed Overall Cognitive Status: Within Functional Limits for tasks assessed                                 General Comments: A&Ox4      Exercises      General Comments        Pertinent Vitals/Pain Pain Assessment: Faces Pain Score: 8  Pain Location: R shoulder with shoulder flexion. Pain Descriptors / Indicators: Guarding;Grimacing;Sore Pain Intervention(s): Monitored during session;Repositioned    Home Living                      Prior Function  PT Goals (current goals can now be found in the care plan section) Acute Rehab PT Goals Patient Stated Goal: try to go home Potential to Achieve Goals: Good Progress towards PT goals: Progressing toward goals    Frequency    Min 5X/week      PT Plan Current plan remains appropriate    Co-evaluation              AM-PAC PT "6 Clicks" Mobility   Outcome Measure  Help needed turning from your back to your side while in a flat bed without using bedrails?: A Little Help needed moving from lying on your  back to sitting on the side of a flat bed without using bedrails?: A Little Help needed moving to and from a bed to a chair (including a wheelchair)?: A Lot Help needed standing up from a chair using your arms (e.g., wheelchair or bedside chair)?: A Lot Help needed to walk in hospital room?: A Little Help needed climbing 3-5 steps with a railing? : A Lot 6 Click Score: 15    End of Session Equipment Utilized During Treatment: Gait belt Activity Tolerance: Patient limited by pain;Patient limited by fatigue Patient left: in chair;with call bell/phone within reach;with chair alarm set Nurse Communication: Mobility status;Need for lift equipment PT Visit Diagnosis: Muscle weakness (generalized) (M62.81);Pain;Other abnormalities of gait and mobility (R26.89) Pain - Right/Left: (back pain.) Pain - part of body: Ankle and joints of foot     Time: [id number] PT Time Calculation (min) (ACUTE ONLY): 21 min  Charges:  $Gait Training: 8-22 mins                     Erin Cooke, PTA Acute Rehabilitation Services Pager [phone] Office [phone]     Erin Cooke 01/26/2019, 12:37 PM

## 2019-01-26 NOTE — TOC Progression Note (Signed)
Transition of Care [facility]) - Progression Note    Patient Details  Name: Erin Cooke MRN: [id number] Date of Birth: December 05, 1940  Transition of Care [facility]) CM/SW [place], [place] Phone Number: 01/26/2019, 4:15 PM  Clinical Narrative:   No discharge order or summary received at this time; patient can DC tomorrow if orders and summary are completed. [facility] can accept patient. CSW to call Tia Alert at [phone] to discuss patient's admission tomorrow. Patient and son at bedside aware. Son will come to hospital tomorrow before patient transitions. CSW to follow.    Expected Discharge Plan: Skilled Nursing Facility Barriers to Discharge: SNF Pending discharge orders, SNF Pending discharge summary  Expected Discharge Plan and Services Expected Discharge Plan: [place] In-house Referral: Clinical Social Work   Post Acute Care Choice: [place] Living arrangements for the past 2 months: Single Family Home                                       Social Determinants of Health (SDOH) Interventions    Readmission Risk Interventions No flowsheet data found.

## 2019-01-26 NOTE — TOC Progression Note (Signed)
Transition of Care [facility]) - Progression Note    Patient Details  Name: Erin Cooke MRN: [id number] Date of Birth: July 22, 1940  Transition of Care [facility]) CM/SW [place], [place] Phone Number: 01/26/2019, 1:49 PM  Clinical Narrative:   CSW following for discharge plan. CSW printed FL2 and scanned to [facility] after MD signature. PASRR number has been received, and [facility] can admit patient today. Awaiting DC order and summary. CSW contacted Neurosurgery office to leave a message for MD to discharge patient. Left a message with Caren Griffins for MD to discharge patient. [facility] will need DC summary by 3:30 PM at the latest. CSW to follow.    Expected Discharge Plan: Skilled Nursing Facility Barriers to Discharge: SNF Pending discharge orders, SNF Pending discharge summary  Expected Discharge Plan and Services Expected Discharge Plan: [place] In-house Referral: Clinical Social Work   Post Acute Care Choice: [place] Living arrangements for the past 2 months: Single Family Home                                       Social Determinants of Health (SDOH) Interventions    Readmission Risk Interventions No flowsheet data found.

## 2019-01-26 NOTE — TOC Progression Note (Signed)
Transition of Care [facility]) - Progression Note    Patient Details  Name: Erin Cooke MRN: [id number] Date of Birth: [birth year]  Transition of Care [facility]) CM/SW [place], [place] Phone Number: 01/26/2019, 10:24 AM  Clinical Narrative:   CSW reached out to neurosurgery office, as MD has still not signed FL2 note for the patient. CSW left voicemail for Caren Griffins, Dr. Lacy Duverney secretary, to ask that he cosign the [facility] for PASRR, because if it's not sent in this morning then she will be stuck here waiting all weekend as [facility] offices are closed for the weekend. CSW received call back from Caren Griffins that she informed MD yesterday and he said he would take care of it. FL2 is still not cosigned. Caren Griffins to try to get a message to MD again today to cosign the FL2. CSW to follow.    Expected Discharge Plan: Skilled Nursing Facility Barriers to Discharge: Other (comment), Awaiting State Approval (PASRR)(MD to co-sign FL2)  Expected Discharge Plan and Services Expected Discharge Plan: [place] In-house Referral: Clinical Social Work   Post Acute Care Choice: [place] Living arrangements for the past 2 months: Single Family Home                                       Social Determinants of Health (SDOH) Interventions    Readmission Risk Interventions No flowsheet data found.

## 2019-01-27 DIAGNOSIS — I2510 Atherosclerotic heart disease of native coronary artery without angina pectoris: Secondary | ICD-10-CM | POA: Diagnosis not present

## 2019-01-27 DIAGNOSIS — Z48811 Encounter for surgical aftercare following surgery on the nervous system: Secondary | ICD-10-CM | POA: Diagnosis not present

## 2019-01-27 DIAGNOSIS — M6281 Muscle weakness (generalized): Secondary | ICD-10-CM | POA: Diagnosis not present

## 2019-01-27 DIAGNOSIS — M2550 Pain in unspecified joint: Secondary | ICD-10-CM | POA: Diagnosis not present

## 2019-01-27 DIAGNOSIS — M48062 Spinal stenosis, lumbar region with neurogenic claudication: Secondary | ICD-10-CM | POA: Diagnosis not present

## 2019-01-27 DIAGNOSIS — E7849 Other hyperlipidemia: Secondary | ICD-10-CM | POA: Diagnosis not present

## 2019-01-27 DIAGNOSIS — M4800 Spinal stenosis, site unspecified: Secondary | ICD-10-CM | POA: Diagnosis not present

## 2019-01-27 DIAGNOSIS — I110 Hypertensive heart disease with heart failure: Secondary | ICD-10-CM | POA: Diagnosis not present

## 2019-01-27 DIAGNOSIS — M545 Low back pain: Secondary | ICD-10-CM | POA: Diagnosis not present

## 2019-01-27 DIAGNOSIS — I509 Heart failure, unspecified: Secondary | ICD-10-CM | POA: Diagnosis not present

## 2019-01-27 DIAGNOSIS — K219 Gastro-esophageal reflux disease without esophagitis: Secondary | ICD-10-CM | POA: Diagnosis not present

## 2019-01-27 DIAGNOSIS — Z7401 Bed confinement status: Secondary | ICD-10-CM | POA: Diagnosis not present

## 2019-01-27 DIAGNOSIS — R2689 Other abnormalities of gait and mobility: Secondary | ICD-10-CM | POA: Diagnosis not present

## 2019-01-27 DIAGNOSIS — R5381 Other malaise: Secondary | ICD-10-CM | POA: Diagnosis not present

## 2019-01-27 DIAGNOSIS — R41841 Cognitive communication deficit: Secondary | ICD-10-CM | POA: Diagnosis not present

## 2019-01-27 NOTE — TOC Transition Note (Signed)
Transition of Care [facility]) - CM/SW Discharge Note   Patient Details  Name: Erin Cooke MRN: [id number] Date of Birth: 11-16-1940  Transition of Care [facility]) CM/SW Contact:  Gelene Mink, [place] Phone Number: 01/27/2019, 11:08 AM   Clinical Narrative:     Patient will DC to: [facility] Anticipated DC date: 01/27/2019 Family notified: Yes Transport by: Corey Harold   Per MD patient ready for DC to . RN, patient, patient's family, and facility notified of DC. Discharge Summary and FL2 sent to facility. RN to call report prior to discharge [phone] Ask for RN Jody). The patient will go to room 150.  DC packet on chart. Ambulance transport requested for patient.   CSW will sign off for now as social work intervention is no longer needed. Please consult [place] again if new needs arise.  Sharaya Boruff, LCSW-A [place]/Clinical Social Work Department Cell: [phone]    Final next level of care: [place] Barriers to Discharge: No Barriers Identified   Patient Goals and CMS Choice Patient states their goals for this hospitalization and ongoing recovery are:: Pt will go to [facility] for [facility]hab CMS Medicare.gov Compare Post Acute Care list provided to:: Patient Represenative (must comment) Choice offered to / list presented to : Adult Children  Discharge Placement   Existing PASRR number confirmed : 01/26/19          Patient chooses bed at: Other - please specify in the comment section below:([facility]) Patient to be transferred to facility by: [place] Name of family member notified: Gwyndolyn Saxon Patient and family notified of of transfer: 01/27/19  Discharge Plan and Services In-house Referral: Clinical Social Work   Post Acute Care Choice: [place]          DME Arranged: N/A DME Agency: NA       HH Arranged: NA HH Agency: NA        Social Determinants of Health (SDOH) Interventions     Readmission Risk  Interventions No flowsheet data found.

## 2019-01-27 NOTE — Progress Notes (Signed)
Gave report to RN at [employer]. Awaiting PTAR

## 2019-01-28 DIAGNOSIS — K219 Gastro-esophageal reflux disease without esophagitis: Secondary | ICD-10-CM | POA: Diagnosis not present

## 2019-01-28 DIAGNOSIS — E7849 Other hyperlipidemia: Secondary | ICD-10-CM | POA: Diagnosis not present

## 2019-01-28 DIAGNOSIS — M545 Low back pain: Secondary | ICD-10-CM | POA: Diagnosis not present

## 2019-01-28 DIAGNOSIS — M4800 Spinal stenosis, site unspecified: Secondary | ICD-10-CM | POA: Diagnosis not present

## 2019-02-09 ENCOUNTER — Inpatient Hospital Stay (HOSPITAL_COMMUNITY)
Admission: AD | Admit: 2019-02-09 | Discharge: 2019-02-13 | DRG: 863 | Disposition: A | Discharge status: Home Health | Payer: Medicare Other | Attending: Neurosurgery | Admitting: Neurosurgery

## 2019-02-09 ENCOUNTER — Other Ambulatory Visit: Payer: Self-pay | Admitting: Neurosurgery

## 2019-02-09 DIAGNOSIS — N189 Chronic kidney disease, unspecified: Secondary | ICD-10-CM | POA: Diagnosis present

## 2019-02-09 DIAGNOSIS — Z7982 Long term (current) use of aspirin: Secondary | ICD-10-CM | POA: Diagnosis not present

## 2019-02-09 DIAGNOSIS — I2510 Atherosclerotic heart disease of native coronary artery without angina pectoris: Secondary | ICD-10-CM | POA: Diagnosis present

## 2019-02-09 DIAGNOSIS — Z981 Arthrodesis status: Secondary | ICD-10-CM

## 2019-02-09 DIAGNOSIS — Z885 Allergy status to narcotic agent status: Secondary | ICD-10-CM | POA: Diagnosis not present

## 2019-02-09 DIAGNOSIS — Z9011 Acquired absence of right breast and nipple: Secondary | ICD-10-CM

## 2019-02-09 DIAGNOSIS — E669 Obesity, unspecified: Secondary | ICD-10-CM | POA: Diagnosis present

## 2019-02-09 DIAGNOSIS — T8141XA Infection following a procedure, superficial incisional surgical site, initial encounter: Secondary | ICD-10-CM | POA: Diagnosis not present

## 2019-02-09 DIAGNOSIS — Z20828 Contact with and (suspected) exposure to other viral communicable diseases: Secondary | ICD-10-CM | POA: Diagnosis present

## 2019-02-09 DIAGNOSIS — S31000A Unspecified open wound of lower back and pelvis without penetration into retroperitoneum, initial encounter: Secondary | ICD-10-CM | POA: Diagnosis not present

## 2019-02-09 DIAGNOSIS — Z96661 Presence of right artificial ankle joint: Secondary | ICD-10-CM | POA: Diagnosis present

## 2019-02-09 DIAGNOSIS — Z79899 Other long term (current) drug therapy: Secondary | ICD-10-CM

## 2019-02-09 DIAGNOSIS — T8149XA Infection following a procedure, other surgical site, initial encounter: Secondary | ICD-10-CM | POA: Diagnosis present

## 2019-02-09 DIAGNOSIS — I110 Hypertensive heart disease with heart failure: Secondary | ICD-10-CM | POA: Diagnosis not present

## 2019-02-09 DIAGNOSIS — Z888 Allergy status to other drugs, medicaments and biological substances status: Secondary | ICD-10-CM | POA: Diagnosis not present

## 2019-02-09 DIAGNOSIS — I5032 Chronic diastolic (congestive) heart failure: Secondary | ICD-10-CM | POA: Diagnosis not present

## 2019-02-09 DIAGNOSIS — K219 Gastro-esophageal reflux disease without esophagitis: Secondary | ICD-10-CM | POA: Diagnosis present

## 2019-02-09 DIAGNOSIS — Z6841 Body Mass Index (BMI) 40.0 and over, adult: Secondary | ICD-10-CM | POA: Diagnosis not present

## 2019-02-09 DIAGNOSIS — Z85038 Personal history of other malignant neoplasm of large intestine: Secondary | ICD-10-CM

## 2019-02-09 DIAGNOSIS — Z8719 Personal history of other diseases of the digestive system: Secondary | ICD-10-CM

## 2019-02-09 DIAGNOSIS — I252 Old myocardial infarction: Secondary | ICD-10-CM | POA: Diagnosis not present

## 2019-02-09 DIAGNOSIS — Z87891 Personal history of nicotine dependence: Secondary | ICD-10-CM

## 2019-02-09 DIAGNOSIS — Z800 Family history of malignant neoplasm of digestive organs: Secondary | ICD-10-CM

## 2019-02-09 DIAGNOSIS — Z853 Personal history of malignant neoplasm of breast: Secondary | ICD-10-CM

## 2019-02-09 DIAGNOSIS — E785 Hyperlipidemia, unspecified: Secondary | ICD-10-CM | POA: Diagnosis present

## 2019-02-09 DIAGNOSIS — Z85828 Personal history of other malignant neoplasm of skin: Secondary | ICD-10-CM

## 2019-02-09 LAB — PROTIME-INR
INR: 1.1 (ref 0.8–1.2)
Prothrombin Time: 13.8 seconds (ref 11.4–15.2)

## 2019-02-09 LAB — COMPREHENSIVE METABOLIC PANEL
ALT: 22 U/L (ref 0–44)
AST: 16 U/L (ref 15–41)
Albumin: 2.9 g/dL — ABNORMAL LOW (ref 3.5–5.0)
Alkaline Phosphatase: 114 U/L (ref 38–126)
Anion gap: 10 (ref 5–15)
BUN: 23 mg/dL (ref 8–23)
CO2: 22 mmol/L (ref 22–32)
Calcium: 8.9 mg/dL (ref 8.9–10.3)
Chloride: 106 mmol/L (ref 98–111)
Creatinine, Ser: 1.35 mg/dL — ABNORMAL HIGH (ref 0.44–1.00)
GFR calc Af Amer: 43 mL/min — ABNORMAL LOW (ref >60)
GFR calc non Af Amer: 37 mL/min — ABNORMAL LOW (ref >60)
Glucose, Bld: 107 mg/dL — ABNORMAL HIGH (ref 70–99)
Potassium: 4.4 mmol/L (ref 3.5–5.1)
Sodium: 138 mmol/L (ref 135–145)
Total Bilirubin: 0.5 mg/dL (ref 0.3–1.2)
Total Protein: 6.6 g/dL (ref 6.5–8.1)

## 2019-02-09 LAB — APTT: aPTT: 28 seconds (ref 24–36)

## 2019-02-09 LAB — CBC WITH DIFFERENTIAL/PLATELET
Abs Immature Granulocytes: 0.11 10*3/uL — ABNORMAL HIGH (ref 0.00–0.07)
Basophils Absolute: 0 10*3/uL (ref 0.0–0.1)
Basophils Relative: 0 %
Eosinophils Absolute: 0.2 10*3/uL (ref 0.0–0.5)
Eosinophils Relative: 2 %
HCT: 35.3 % — ABNORMAL LOW (ref 36.0–46.0)
Hemoglobin: 11.1 g/dL — ABNORMAL LOW (ref 12.0–15.0)
Immature Granulocytes: 1 %
Lymphocytes Relative: 25 %
Lymphs Abs: 2.4 10*3/uL (ref 0.7–4.0)
MCH: 28.8 pg (ref 26.0–34.0)
MCHC: 31.4 g/dL (ref 30.0–36.0)
MCV: 91.5 fL (ref 80.0–100.0)
Monocytes Absolute: 0.8 10*3/uL (ref 0.1–1.0)
Monocytes Relative: 8 %
Neutro Abs: 5.9 10*3/uL (ref 1.7–7.7)
Neutrophils Relative %: 64 %
Platelets: 401 10*3/uL — ABNORMAL HIGH (ref 150–400)
RBC: 3.86 MIL/uL — ABNORMAL LOW (ref 3.87–5.11)
RDW: 13.8 % (ref 11.5–15.5)
WBC: 9.4 10*3/uL (ref 4.0–10.5)
nRBC: 0 % (ref 0.0–0.2)

## 2019-02-09 LAB — SARS CORONAVIRUS 2 (TAT 6-24 HRS): SARS Coronavirus 2: NEGATIVE

## 2019-02-09 LAB — SURGICAL PCR SCREEN
MRSA, PCR: NEGATIVE
Staphylococcus aureus: NEGATIVE

## 2019-02-09 MED ORDER — PREDNISOLONE ACETATE 1 % OP SUSP
1.0000 [drp] | Freq: Three times a day (TID) | OPHTHALMIC | Status: DC
  Administered 2019-02-09 – 2019-02-13 (×10): 1 [drp] via OPHTHALMIC
  Filled 2019-02-09: qty 5

## 2019-02-09 MED ORDER — METOPROLOL TARTRATE 50 MG PO TABS
100.0000 mg | ORAL_TABLET | Freq: Two times a day (BID) | ORAL | Status: DC
  Administered 2019-02-09 – 2019-02-13 (×8): 100 mg via ORAL
  Filled 2019-02-09 (×8): qty 2

## 2019-02-09 MED ORDER — ARTIFICIAL TEARS OPHTHALMIC OINT
1.0000 "application " | TOPICAL_OINTMENT | Freq: Every day | OPHTHALMIC | Status: DC
  Administered 2019-02-09 – 2019-02-12 (×2): 1 via OPHTHALMIC
  Filled 2019-02-09: qty 3.5

## 2019-02-09 MED ORDER — ZOLPIDEM TARTRATE 5 MG PO TABS: 5.0000 mg | ORAL_TABLET | Freq: Every evening | ORAL | Status: DC | PRN

## 2019-02-09 MED ORDER — LOSARTAN POTASSIUM 50 MG PO TABS
50.0000 mg | ORAL_TABLET | Freq: Every day | ORAL | Status: DC
  Administered 2019-02-11 – 2019-02-13 (×3): 50 mg via ORAL
  Filled 2019-02-09 (×3): qty 1

## 2019-02-09 MED ORDER — SODIUM CHLORIDE 0.9% FLUSH: 3.0000 mL | INTRAVENOUS | Status: DC | PRN

## 2019-02-09 MED ORDER — PREDNISOLONE ACETATE 1 % OP SUSP: 1.0000 [drp] | OPHTHALMIC | Status: DC

## 2019-02-09 MED ORDER — HEPARIN SODIUM (PORCINE) 5000 UNIT/ML IJ SOLN
5000.0000 [IU] | Freq: Three times a day (TID) | INTRAMUSCULAR | Status: DC
  Administered 2019-02-09 – 2019-02-13 (×11): 5000 [IU] via SUBCUTANEOUS
  Filled 2019-02-09 (×12): qty 1

## 2019-02-09 MED ORDER — SODIUM CHLORIDE 0.9 % IV SOLN
250.0000 mL | INTRAVENOUS | Status: DC | PRN
  Administered 2019-02-11: 20:00:00 250 mL via INTRAVENOUS

## 2019-02-09 MED ORDER — PANTOPRAZOLE SODIUM 40 MG PO TBEC
40.0000 mg | DELAYED_RELEASE_TABLET | Freq: Every day | ORAL | Status: DC
  Administered 2019-02-10 – 2019-02-13 (×4): 40 mg via ORAL
  Filled 2019-02-09 (×4): qty 1

## 2019-02-09 MED ORDER — ACETAMINOPHEN 650 MG RE SUPP: 650.0000 mg | Freq: Four times a day (QID) | RECTAL | Status: DC | PRN

## 2019-02-09 MED ORDER — FAMOTIDINE 20 MG PO TABS
20.0000 mg | ORAL_TABLET | Freq: Every day | ORAL | Status: DC
  Administered 2019-02-09 – 2019-02-12 (×4): 20 mg via ORAL
  Filled 2019-02-09 (×4): qty 1

## 2019-02-09 MED ORDER — POTASSIUM CHLORIDE IN NACL 20-0.9 MEQ/L-% IV SOLN
INTRAVENOUS | Status: DC
  Administered 2019-02-09: 23:00:00 via INTRAVENOUS
  Filled 2019-02-09: qty 1000

## 2019-02-09 MED ORDER — ACETAMINOPHEN 325 MG PO TABS
650.0000 mg | ORAL_TABLET | Freq: Four times a day (QID) | ORAL | Status: DC | PRN
  Administered 2019-02-11 – 2019-02-13 (×3): 650 mg via ORAL
  Filled 2019-02-09 (×3): qty 2

## 2019-02-09 MED ORDER — SODIUM CHLORIDE 0.9% FLUSH
3.0000 mL | Freq: Two times a day (BID) | INTRAVENOUS | Status: DC
  Administered 2019-02-12 – 2019-02-13 (×2): 3 mL via INTRAVENOUS

## 2019-02-09 MED ORDER — OXYCODONE HCL 5 MG PO TABS
5.0000 mg | ORAL_TABLET | ORAL | Status: DC | PRN
  Filled 2019-02-09: qty 1

## 2019-02-09 MED ORDER — NITROGLYCERIN 0.4 MG SL SUBL: 0.4000 mg | SUBLINGUAL_TABLET | SUBLINGUAL | Status: DC | PRN

## 2019-02-09 MED ORDER — ARTIFICIAL TEARS OPHTHALMIC OINT
TOPICAL_OINTMENT | Freq: Four times a day (QID) | OPHTHALMIC | Status: DC
  Administered 2019-02-10 (×2): via OPHTHALMIC
  Administered 2019-02-10: 1 via OPHTHALMIC
  Administered 2019-02-11: 10:00:00 via OPHTHALMIC
  Administered 2019-02-11: 1 via OPHTHALMIC
  Filled 2019-02-09: qty 3.5

## 2019-02-09 MED ORDER — OMEPRAZOLE MAGNESIUM 20 MG PO TBEC: 20.0000 mg | DELAYED_RELEASE_TABLET | Freq: Every morning | ORAL | Status: DC

## 2019-02-09 MED ORDER — ADULT MULTIVITAMIN W/MINERALS CH
1.0000 | ORAL_TABLET | Freq: Every day | ORAL | Status: DC
  Administered 2019-02-11: 10:00:00 1 via ORAL
  Filled 2019-02-09 (×3): qty 1

## 2019-02-09 MED ORDER — PREDNISOLONE ACETATE 1 % OP SUSP
1.0000 [drp] | Freq: Every day | OPHTHALMIC | Status: DC
  Administered 2019-02-10 – 2019-02-12 (×5): 1 [drp] via OPHTHALMIC
  Filled 2019-02-09: qty 5

## 2019-02-09 MED ORDER — POLYETHYL GLYCOL-PROPYL GLYCOL 0.4-0.3 % OP GEL
1.0000 "application " | Freq: Four times a day (QID) | OPHTHALMIC | Status: DC
  Filled 2019-02-09: qty 10

## 2019-02-09 MED ORDER — FUROSEMIDE 20 MG PO TABS
20.0000 mg | ORAL_TABLET | ORAL | Status: DC
  Administered 2019-02-11: 10:00:00 20 mg via ORAL
  Filled 2019-02-09 (×2): qty 1

## 2019-02-09 MED ORDER — ONDANSETRON HCL 4 MG PO TABS: 4.0000 mg | ORAL_TABLET | Freq: Four times a day (QID) | ORAL | Status: DC | PRN

## 2019-02-09 MED ORDER — ATORVASTATIN CALCIUM 80 MG PO TABS
80.0000 mg | ORAL_TABLET | Freq: Every evening | ORAL | Status: DC
  Administered 2019-02-10 – 2019-02-12 (×2): 80 mg via ORAL
  Filled 2019-02-09 (×3): qty 1

## 2019-02-09 MED ORDER — ONDANSETRON HCL 4 MG/2ML IJ SOLN: 4.0000 mg | Freq: Four times a day (QID) | INTRAMUSCULAR | Status: DC | PRN

## 2019-02-09 MED ORDER — ASPIRIN EC 81 MG PO TBEC
81.0000 mg | DELAYED_RELEASE_TABLET | Freq: Every evening | ORAL | Status: DC
  Administered 2019-02-12: 17:00:00 81 mg via ORAL
  Filled 2019-02-09 (×2): qty 1

## 2019-02-09 NOTE — H&P (Signed)
Erin Cooke is an 78 y.o. female.   Chief Complaint: foul smelling wound HPI: Erin Cooke was in the [facility] rehab facility post lumbar fusion. When she was transferred on 01/26/2019 a bandage was still on her wound. The bandage was never changed, the wound was never inspected, and complaints by Erin Cooke that the wound smelled bad were ignored. When she was sent home, the home health nurse did inspect the wound and felt it was infected. My office arranged for her to be admitted for operative debridement.  Past Medical History:  Diagnosis Date  . Arthritis   . Cancer (HCC)    breast  . CHF (congestive heart failure) ([place])   . Chronic back pain    synovial cyst,radiculopathy,and stenosis  . CKD (chronic kidney disease)   . Colon cancer ([place])   . Coronary artery disease   . Dyspnea   . Erosive esophagitis   . Fatty liver   . GERD (gastroesophageal reflux disease)    takes OTC Prilosec daily  . History of colon polyps   . History of ischemic cardiomyopathy   . Hyperlipidemia    takes Atorvastatin daily  . Microscopic colitis   . Mitral regurgitation    mild-moderate MR 02/10/18 [place]  . Myocardial infarction ([place]) 1989, 2017  . Palpitations    takes betapace daily  . PONV (postoperative nausea and vomiting)    states Forane elevated her liver enzymes  . Skin cancer    (8) squamous cells  . Stress incontinence   . Thyroid nodule    benign    Past Surgical History:  Procedure Laterality Date  . ANKLE SURGERY Right 1990   pins and plate  . APPENDECTOMY    . BACK SURGERY  2011  . BIOPSY THYROID    . CARDIAC CATHETERIZATION  1989/1999  . CARPAL TUNNEL RELEASE Right 2004  . cataract surgery with corneal transplant  Right 2008  . cataract surgery with corneal transplant  Left 2014  . COLON SURGERY  2017  . COLONOSCOPY    . COLONOSCOPY N/A 05/01/2015   Procedure: COLONOSCOPY;  Surgeon: Rogene Houston, MD;  [facility];  Service: Endoscopy;   Laterality: N/A;  . COLONOSCOPY N/A 07/28/2016   Procedure: COLONOSCOPY;  Surgeon: Rogene Houston, MD;  Location[facility];  Service: Endoscopy;  Laterality: N/A;  830 - moved to 4/11 @ 12:00  . COLONOSCOPY N/A 08/18/2017   Procedure: COLONOSCOPY;  Surgeon: Rogene Houston, MD;  Location[facility];  Service: Endoscopy;  Laterality: N/A;  930  . CORONARY ANGIOPLASTY     2 stents  . DILATION AND CURETTAGE OF UTERUS  2014  . ESOPHAGOGASTRODUODENOSCOPY    . ESOPHAGOGASTRODUODENOSCOPY N/A 07/28/2016   Procedure: ESOPHAGOGASTRODUODENOSCOPY (EGD);  Surgeon: Rogene Houston, MD;  [facility];  Service: Endoscopy;  Laterality: N/A;  . eye lid surgery Bilateral 2014  . MASTECTOMY, PARTIAL Right 2013  . POLYPECTOMY  08/18/2017   Procedure: POLYPECTOMY;  Surgeon: Rogene Houston, MD;  Location: [facility];  Service: Endoscopy;;  transverse  . ROTATOR CUFF REPAIR Left 1989  . ROTATOR CUFF REPAIR Right 2001  . SPINAL FUSION  2012  . TOTAL ANKLE REPLACEMENT Right 2012    Family History  Problem Relation Age of Onset  . Colon cancer Other    Social History:  reports that she has quit smoking. She has a 2.50 pack-year smoking history. She has never used smokeless tobacco. She reports that  she does not drink alcohol or use drugs.  Allergies:  Allergies  Allergen Reactions  . Demerol [Meperidine Hcl] Nausea And Vomiting  . Forane [Isoflurane] Other (See Comments)    Elevated liver enzymes  . Letrozole Other (See Comments)    Muscle pain    Medications Prior to Admission  Medication Sig Dispense Refill  . acetaminophen (TYLENOL) 500 MG tablet Take 1,000 mg by mouth 2 (two) times daily as needed for moderate pain or headache.    Marland Kitchen aspirin EC 81 MG tablet Take 81 mg by mouth every evening.     Marland Kitchen atorvastatin (LIPITOR) 80 MG tablet Take 80 mg by mouth every evening.     . famotidine (PEPCID) 20 MG tablet Take 20 mg by mouth at bedtime.    . furosemide (LASIX) 20 MG tablet  Take 20 mg by mouth every other day.     . loperamide (IMODIUM) 2 MG capsule Take 4 mg by mouth as needed for diarrhea or loose stools.     Marland Kitchen losartan (COZAAR) 50 MG tablet Take 50 mg by mouth daily.    . metoprolol (LOPRESSOR) 100 MG tablet Take 100 mg by mouth 2 (two) times daily.    . Multiple Vitamin (MULTIVITAMIN WITH MINERALS) TABS tablet Take 1 tablet by mouth daily. Centrum Silver    . nitroGLYCERIN (NITROSTAT) 0.4 MG SL tablet Place 0.4 mg under the tongue every 5 (five) minutes as needed for chest pain.    Marland Kitchen omeprazole (PRILOSEC OTC) 20 MG tablet Take 20 mg by mouth every morning.    Marland Kitchen oxyCODONE (OXY IR/ROXICODONE) 5 MG immediate release tablet Take 1 tablet (5 mg total) by mouth every 6 (six) hours as needed for moderate pain ((score 4 to 6)). (Patient not taking: Reported on 02/09/2019) 30 tablet 0  . Polyethyl Glycol-Propyl Glycol (SYSTANE) 0.4-0.3 % GEL ophthalmic gel Place 1 application into both eyes 4 (four) times daily.    . prednisoLONE acetate (PRED FORTE) 1 % ophthalmic suspension Place 1 drop into both eyes See admin instructions. Right Eye 3 times daily Left Eye once daily    . Probiotic Product (PHILLIPS COLON HEALTH PO) Take 1 capsule by mouth daily.    . Wheat Dextrin (BENEFIBER PO) Take 1 Dose by mouth daily.    Dema Severin Petrolatum-Mineral Oil (SYSTANE NIGHTTIME) OINT Place 1 application into both eyes at bedtime.      No results found for this or any previous visit (from the past 48 hour(s)). No results found.  Review of Systems  HENT: Negative.   Eyes: Negative.   Respiratory: Negative.   Cardiovascular: Negative.   Gastrointestinal: Negative.   Genitourinary: Negative.   Musculoskeletal: Positive for back pain.  Skin:       Infected lumbar wound  Neurological: Negative.   Endo/Heme/Allergies: Negative.   Psychiatric/Behavioral: Negative.     Blood pressure 121/61, pulse (!) 106, temperature 98.2 F (36.8 C), temperature source Oral, resp. rate 18, SpO2  96 %. Physical Exam  Constitutional: She is oriented to person, place, and time. She appears well-developed and well-nourished. She appears distressed.  HENT:  Head: Normocephalic and atraumatic.  Eyes: Pupils are equal, round, and reactive to light. Conjunctivae and EOM are normal.  Neck: Normal range of motion. Neck supple.  Cardiovascular: Normal rate.  Respiratory: Effort normal and breath sounds normal.  GI: Soft. Bowel sounds are normal.  Musculoskeletal: Normal range of motion.  Neurological: She is alert and oriented to person, place, and time. She has  normal reflexes. She displays normal reflexes. No cranial nerve deficit. She exhibits normal muscle tone. Coordination normal.  Skin:  Lumbar wound erythematous, tender, drainage purulent  Psychiatric: She has a normal mood and affect. Her behavior is normal. Judgment and thought content normal.     Assessment/Plan Wound infection OR tomorrow.   Ashok Pall, MD 02/09/2019, 5:47 PM

## 2019-02-10 ENCOUNTER — Inpatient Hospital Stay (HOSPITAL_COMMUNITY): Payer: Medicare Other | Admitting: Certified Registered"

## 2019-02-10 ENCOUNTER — Encounter (HOSPITAL_COMMUNITY): Payer: Self-pay | Admitting: Certified Registered"

## 2019-02-10 ENCOUNTER — Encounter (HOSPITAL_COMMUNITY)
Admission: AD | Disposition: A | Discharge status: Home Health | Payer: Self-pay | Source: Home / Self Care | Attending: Neurosurgery

## 2019-02-10 ENCOUNTER — Inpatient Hospital Stay (HOSPITAL_COMMUNITY): Admission: RE | Admit: 2019-02-10 | Payer: Medicare Other | Source: Home / Self Care | Admitting: Neurosurgery

## 2019-02-10 HISTORY — PX: LUMBAR WOUND DEBRIDEMENT: SHX1988

## 2019-02-10 SURGERY — LUMBAR WOUND DEBRIDEMENT
Anesthesia: General | Site: Back

## 2019-02-10 MED ORDER — VANCOMYCIN HCL 10 G IV SOLR
1250.0000 mg | INTRAVENOUS | Status: DC
  Administered 2019-02-12: 10:00:00 1250 mg via INTRAVENOUS
  Filled 2019-02-10: qty 1250

## 2019-02-10 MED ORDER — SUGAMMADEX SODIUM 500 MG/5ML IV SOLN
INTRAVENOUS | Status: DC | PRN
  Administered 2019-02-10: 500 mg via INTRAVENOUS

## 2019-02-10 MED ORDER — FENTANYL CITRATE (PF) 100 MCG/2ML IJ SOLN
INTRAMUSCULAR | Status: DC | PRN
  Administered 2019-02-10: 100 ug via INTRAVENOUS

## 2019-02-10 MED ORDER — POVIDONE-IODINE 10 % EX OINT
TOPICAL_OINTMENT | CUTANEOUS | Status: DC | PRN
  Administered 2019-02-10: 1 via TOPICAL

## 2019-02-10 MED ORDER — PROPOFOL 10 MG/ML IV BOLUS
INTRAVENOUS | Status: DC | PRN
  Administered 2019-02-10: 150 mg via INTRAVENOUS

## 2019-02-10 MED ORDER — LIDOCAINE 2% (20 MG/ML) 5 ML SYRINGE
INTRAMUSCULAR | Status: DC | PRN
  Administered 2019-02-10: 60 mg via INTRAVENOUS

## 2019-02-10 MED ORDER — PROPOFOL 500 MG/50ML IV EMUL
INTRAVENOUS | Status: DC | PRN
  Administered 2019-02-10: 100 ug/kg/min via INTRAVENOUS

## 2019-02-10 MED ORDER — DEXAMETHASONE SODIUM PHOSPHATE 10 MG/ML IJ SOLN
INTRAMUSCULAR | Status: DC | PRN
  Administered 2019-02-10: 4 mg via INTRAVENOUS

## 2019-02-10 MED ORDER — VANCOMYCIN HCL 1000 MG IV SOLR
INTRAVENOUS | Status: DC | PRN
  Administered 2019-02-10: 1000 mg via INTRAVENOUS

## 2019-02-10 MED ORDER — ROCURONIUM BROMIDE 10 MG/ML (PF) SYRINGE
PREFILLED_SYRINGE | INTRAVENOUS | Status: DC | PRN
  Administered 2019-02-10: 50 mg via INTRAVENOUS

## 2019-02-10 MED ORDER — PHENYLEPHRINE 40 MCG/ML (10ML) SYRINGE FOR IV PUSH (FOR BLOOD PRESSURE SUPPORT)
PREFILLED_SYRINGE | INTRAVENOUS | Status: DC | PRN
  Administered 2019-02-10: 80 ug via INTRAVENOUS
  Administered 2019-02-10 (×2): 120 ug via INTRAVENOUS

## 2019-02-10 MED ORDER — LACTATED RINGERS IV SOLN
INTRAVENOUS | Status: DC
  Administered 2019-02-10: 10:00:00 via INTRAVENOUS

## 2019-02-10 MED ORDER — 0.9 % SODIUM CHLORIDE (POUR BTL) OPTIME
TOPICAL | Status: DC | PRN
  Administered 2019-02-10: 11:00:00 1000 mL

## 2019-02-10 MED ORDER — MIDAZOLAM HCL 5 MG/5ML IJ SOLN
INTRAMUSCULAR | Status: DC | PRN
  Administered 2019-02-10: 2 mg via INTRAVENOUS

## 2019-02-10 MED ORDER — ONDANSETRON HCL 4 MG/2ML IJ SOLN
INTRAMUSCULAR | Status: DC | PRN
  Administered 2019-02-10: 4 mg via INTRAVENOUS

## 2019-02-10 SURGICAL SUPPLY — 62 items
BAG DECANTER FOR FLEXI CONT (MISCELLANEOUS) ×3 IMPLANT
BENZOIN TINCTURE PRP APPL 2/3 (GAUZE/BANDAGES/DRESSINGS) IMPLANT
BLADE CLIPPER SURG (BLADE) IMPLANT
CANISTER SUCT 3000ML PPV (MISCELLANEOUS) ×3 IMPLANT
CARTRIDGE OIL MAESTRO DRILL (MISCELLANEOUS) IMPLANT
CLOSURE WOUND 1/2 X4 (GAUZE/BANDAGES/DRESSINGS)
CONT SPEC 4OZ CLIKSEAL STRL BL (MISCELLANEOUS) IMPLANT
COVER WAND RF STERILE (DRAPES) ×3 IMPLANT
DECANTER SPIKE VIAL GLASS SM (MISCELLANEOUS) ×3 IMPLANT
DIFFUSER DRILL AIR PNEUMATIC (MISCELLANEOUS) IMPLANT
DRAPE LAPAROTOMY 100X72X124 (DRAPES) ×3 IMPLANT
DRAPE POUCH INSTRU U-SHP 10X18 (DRAPES) IMPLANT
DRAPE SURG 17X23 STRL (DRAPES) ×3 IMPLANT
DURAPREP 26ML APPLICATOR (WOUND CARE) ×3 IMPLANT
ELECT REM PT RETURN 9FT ADLT (ELECTROSURGICAL) ×3
ELECTRODE REM PT RTRN 9FT ADLT (ELECTROSURGICAL) ×1 IMPLANT
GAUZE 4X4 16PLY RFD (DISPOSABLE) IMPLANT
GAUZE SPONGE 4X4 12PLY STRL (GAUZE/BANDAGES/DRESSINGS) ×3 IMPLANT
GLOVE BIO SURGEON STRL SZ 6.5 (GLOVE) ×2 IMPLANT
GLOVE BIO SURGEON STRL SZ7 (GLOVE) ×3 IMPLANT
GLOVE BIO SURGEON STRL SZ7.5 (GLOVE) IMPLANT
GLOVE BIO SURGEON STRL SZ8 (GLOVE) IMPLANT
GLOVE BIO SURGEON STRL SZ8.5 (GLOVE) IMPLANT
GLOVE BIO SURGEONS STRL SZ 6.5 (GLOVE) ×1
GLOVE BIOGEL M 8.0 STRL (GLOVE) IMPLANT
GLOVE ECLIPSE 6.5 STRL STRAW (GLOVE) ×3 IMPLANT
GLOVE ECLIPSE 7.0 STRL STRAW (GLOVE) ×3 IMPLANT
GLOVE ECLIPSE 7.5 STRL STRAW (GLOVE) IMPLANT
GLOVE ECLIPSE 8.0 STRL XLNG CF (GLOVE) IMPLANT
GLOVE ECLIPSE 8.5 STRL (GLOVE) IMPLANT
GLOVE EXAM NITRILE XL STR (GLOVE) IMPLANT
GLOVE INDICATOR 6.5 STRL GRN (GLOVE) IMPLANT
GLOVE INDICATOR 7.0 STRL GRN (GLOVE) IMPLANT
GLOVE INDICATOR 7.5 STRL GRN (GLOVE) IMPLANT
GLOVE INDICATOR 8.0 STRL GRN (GLOVE) IMPLANT
GLOVE INDICATOR 8.5 STRL (GLOVE) IMPLANT
GLOVE OPTIFIT SS 8.0 STRL (GLOVE) IMPLANT
GLOVE SURG SS PI 6.5 STRL IVOR (GLOVE) IMPLANT
GOWN STRL REUS W/ TWL LRG LVL3 (GOWN DISPOSABLE) ×2 IMPLANT
GOWN STRL REUS W/ TWL XL LVL3 (GOWN DISPOSABLE) IMPLANT
GOWN STRL REUS W/TWL 2XL LVL3 (GOWN DISPOSABLE) IMPLANT
GOWN STRL REUS W/TWL LRG LVL3 (GOWN DISPOSABLE) ×4
GOWN STRL REUS W/TWL XL LVL3 (GOWN DISPOSABLE)
KIT BASIN OR (CUSTOM PROCEDURE TRAY) ×3 IMPLANT
KIT TURNOVER KIT B (KITS) ×3 IMPLANT
NS IRRIG 1000ML POUR BTL (IV SOLUTION) ×3 IMPLANT
OIL CARTRIDGE MAESTRO DRILL (MISCELLANEOUS)
PACK LAMINECTOMY NEURO (CUSTOM PROCEDURE TRAY) ×3 IMPLANT
PAD ARMBOARD 7.5X6 YLW CONV (MISCELLANEOUS) ×9 IMPLANT
SET ADHESIVE SKIN CLSR ABRA (MISCELLANEOUS) ×9 IMPLANT
SPONGE LAP 4X18 RFD (DISPOSABLE) IMPLANT
SPONGE SURGIFOAM ABS GEL SZ50 (HEMOSTASIS) IMPLANT
STRIP CLOSURE SKIN 1/2X4 (GAUZE/BANDAGES/DRESSINGS) IMPLANT
SUT VIC AB 0 CT1 18XCR BRD8 (SUTURE) ×1 IMPLANT
SUT VIC AB 0 CT1 8-18 (SUTURE) ×2
SUT VIC AB 2-0 CT1 18 (SUTURE) ×3 IMPLANT
SUT VIC AB 3-0 SH 8-18 (SUTURE) ×3 IMPLANT
SWAB COLLECTION DEVICE MRSA (MISCELLANEOUS) ×3 IMPLANT
SWAB CULTURE ESWAB REG 1ML (MISCELLANEOUS) ×3 IMPLANT
TOWEL GREEN STERILE (TOWEL DISPOSABLE) ×3 IMPLANT
TOWEL GREEN STERILE FF (TOWEL DISPOSABLE) ×3 IMPLANT
WATER STERILE IRR 1000ML POUR (IV SOLUTION) ×3 IMPLANT

## 2019-02-10 NOTE — Transfer of Care (Signed)
Immediate Anesthesia Transfer of Care Note  Patient: Erin Cooke  Procedure(s) Performed: LUMBAR WOUND DEBRIDEMENT (N/A Back)  Patient Location: PACU  Anesthesia Type:General  Level of Consciousness: drowsy and patient cooperative  Airway & Oxygen Therapy: Patient Spontanous Breathing and Patient connected to nasal cannula oxygen  Post-op Assessment: Report given to RN, Post -op Vital signs reviewed and stable and Patient moving all extremities  Post vital signs: Reviewed and stable  Last Vitals:  Vitals Value Taken Time  BP    Temp    Pulse 59 02/10/19 1139  Resp 13 02/10/19 1139  SpO2 98 % 02/10/19 1139  Vitals shown include unvalidated device data.  Last Pain:  Vitals:   02/10/19 0755  TempSrc: Oral  PainSc:          Complications: No apparent anesthesia complications

## 2019-02-10 NOTE — Progress Notes (Signed)
Pharmacy Antibiotic Note  Erin Cooke is a 78 y.o. female admitted on 02/09/2019 with superficial lumbar wound infection s/p debridement 10/24 in OR.  Pharmacy has been consulted for Vancomycin dosing. Pre-operative Vancomycin 1000 mg given at 1105 AM. WBC within normal limits. Afebrile. Wound was foul smelling but not deep and no abscess or purulence noted per Dr. Lacy Duverney operative note.   Most recent documented weight from 01/26/19 was 97.3kg. BMI 40.  SCr is elevated at 1.35 - similar to prior SCr from September 2020.  10/24 Aerobic superficial culture sent from OR.   Plan: Vancomycin 1250 mg IV Q 48 hrs. Goal AUC 400-550. Expected AUC: 508  SCr used: 1.35 Monitor renal function, clinical status, and culture results.      Temp (24hrs), Avg:97.9 F (36.6 C), Min:97.6 F (36.4 C), Max:98.2 F (36.8 C)  Recent Labs  Lab 02/09/19 1827  WBC 9.4  CREATININE 1.35*    CrCl cannot be calculated (Unknown ideal weight.).    Allergies  Allergen Reactions  . Demerol [Meperidine Hcl] Nausea And Vomiting  . Forane [Isoflurane] Other (See Comments)    Elevated liver enzymes  . Letrozole Other (See Comments)    Muscle pain    Antimicrobials this admission: Vancomycin 10/24 >>  Dose adjustments this admission:  Microbiology results: 10/24 Wound, superficial culture >> 10/24 MRSA PCR: negative  Thank you for allowing pharmacy to be a part of this patient's care.  Sloan Leiter, PharmD, BCPS, BCCCP Clinical Pharmacist Clinical phone 02/10/2019 until [phone] Please refer to [facility] for [place] numbers 02/10/2019 1:35 PM

## 2019-02-10 NOTE — Anesthesia Procedure Notes (Signed)
Procedure Name: Intubation Date/Time: 02/10/2019 10:40 AM Performed by: Moshe Salisbury, CRNA Pre-anesthesia Checklist: Patient identified, Emergency Drugs available, Suction available and Patient being monitored Patient Re-evaluated:Patient Re-evaluated prior to induction Oxygen Delivery Method: Circle System Utilized Preoxygenation: Pre-oxygenation with 100% oxygen Induction Type: IV induction Ventilation: Mask ventilation without difficulty Laryngoscope Size: Mac and 3 Grade View: Grade III Tube type: Oral Tube size: 7.0 mm Number of attempts: 1 Airway Equipment and Method: Stylet Placement Confirmation: ETT inserted through vocal cords under direct vision,  positive ETCO2 and breath sounds checked- equal and bilateral Secured at: 21 cm Tube secured with: Tape Dental Injury: Teeth and Oropharynx as per pre-operative assessment

## 2019-02-10 NOTE — Op Note (Signed)
02/10/2019  11:52 AM  PATIENT:  Erin Cooke  78 y.o. female  PRE-OPERATIVE DIAGNOSIS:  Lumbar wound infection  POST-OPERATIVE DIAGNOSIS:  Lumbar wound infection  PROCEDURE:  Procedure(s): LUMBAR WOUND DEBRIDEMENT  SURGEON: Surgeon(s): Ashok Pall, MD  ASSISTANTS:none  ANESTHESIA:   tiva  EBL:  Total I/O In: 750 [I.V.:750] Out: 15 [Blood:15]  BLOOD ADMINISTERED:none  CELL SAVER GIVEN:none  COUNT:per nursing  DRAINS: none   SPECIMEN:  Source of Specimen:  lumbar wound  DICTATION: Erin Cooke was taken to the operating room, intubated, and given a total intravenous anesthetic without difficulty. She was positioned prone on the Wilson frame. Her lumbar wound was prepped and draped in a sterile manner. I removed the sutures which were still in place. I opened what I could just with spreading of a hemostat. The wound integrity was quite good. It was foul smelling. I used dissectors and the suction to see if the infection wAs at all deep. It was not, there were no abscesses, no purulence at all. I irrigated the surface and just underneath the skin copiously. I simply could not identify a deeper infection. I placed ointment on the incision, and covered it with gauze. She was rolled onto a stretcher, and extubated with no difficulty.   PLAN OF CARE: Admit to inpatient   PATIENT DISPOSITION:  PACU - hemodynamically stable.   Delay start of Pharmacological VTE agent (>24hrs) due to surgical blood loss or risk of bleeding:  no

## 2019-02-10 NOTE — Anesthesia Postprocedure Evaluation (Signed)
Anesthesia Post Note  Patient: Erin Cooke  Procedure(s) Performed: LUMBAR WOUND DEBRIDEMENT (N/A Back)     Patient location during evaluation: PACU Anesthesia Type: General Level of consciousness: awake and alert Pain management: pain level controlled Vital Signs Assessment: post-procedure vital signs reviewed and stable Respiratory status: spontaneous breathing, nonlabored ventilation, respiratory function stable and patient connected to nasal cannula oxygen Cardiovascular status: blood pressure returned to baseline and stable Postop Assessment: no apparent nausea or vomiting Anesthetic complications: no    Last Vitals:  Vitals:   02/10/19 1240 02/10/19 1303  BP: 140/65 (!) 135/57  Pulse: (!) 50 64  Resp: 13 14  Temp: 36.7 C 36.4 C  SpO2: 98% 100%    Last Pain:  Vitals:   02/10/19 1303  TempSrc: Oral  PainSc: 0-No pain                 Tiajuana Amass

## 2019-02-10 NOTE — Anesthesia Preprocedure Evaluation (Addendum)
Anesthesia Evaluation  Patient identified by MRN, date of birth, ID band Patient awake    Reviewed: Allergy & Precautions, NPO status , Patient's Chart, lab work & pertinent test results, reviewed documented beta blocker date and time   History of Anesthesia Complications (+) PONV  Airway Mallampati: III  TM Distance: >3 FB Neck ROM: Full  Mouth opening: Limited Mouth Opening  Dental no notable dental hx. (+) Teeth Intact, Dental Advisory Given   Pulmonary former smoker,    Pulmonary exam normal breath sounds clear to auscultation       Cardiovascular hypertension, Pt. on medications and Pt. on home beta blockers + CAD, + Past MI and +CHF  Normal cardiovascular exam+ dysrhythmias + Valvular Problems/Murmurs MR  Rhythm:Regular Rate:Normal  MI 1989, NSTEMI postop 2017  CHFpEF  Last TTE 2019: Suboptimal, EF 50% (was 45-50%), aortic valve sclerosis, mild-moderate MR, mild TR, grade 1 diastolic dysfunction, RVSP 30.  Palpitations since colorectal surgery- started on metoprolol   UPDATE 01/16/19 12:45 PM: The following records were received from [facility]: - Nuclear stress test 04/22/15 ([place]):  Impressions:  1. Status post myocardial infarction in the right coronary artery or left circumflex territory with minimal amount of peri-infarct reversibility. 2.  Well preserved resting LV systolic function.  EF measured at 60%.  - Cardiac cath 09/03/14 ([place]): Findings: LM: Normal. Bifurcates into the LAD and left circumflex. LAD and CX proximally appeared to be calcified. LAD: Wraps around the apex of the left ventricle and no significant disease is seen.  D1 branch average size vessel with 40% stenosis in the proximal to mid segment. LCX: Gives rise to an average sized OM1.  There is 40% narrowing in the mid circumflex.  There is collateral filling to the distal right. RCA: Totally occluded in the proximal to  mid segment, appears to be chronic occlusion with collateralization from the left system. Impression: Significant disease involving the RCA, which is occluded with collaterals from the left system as outlined above with nonobstructive disease in the LAD and the left circumflex as outlined above.  The patient will be continued on aggressive medical therapy.    Neuro/Psych negative neurological ROS  negative psych ROS   GI/Hepatic PUD, GERD  Medicated,CRC s/p colectomy 0000000 complicated by postop NSTEMI  Erosive esophagitis   Endo/Other  Obesity BMI 39  Renal/GU CRFRenal disease   Stress incontinence    Musculoskeletal  (+) Arthritis , Osteoarthritis,  Chronic LBP   Abdominal Normal abdominal exam  (+)   Peds  Hematology  (+) anemia ,   Anesthesia Other Findings Elevated LFTs with isoflurane? In 2011  Reproductive/Obstetrics Hx breast ca s/p mastectomy 2013                             Lab Results  Component Value Date   WBC 9.4 02/09/2019   HGB 11.1 (L) 02/09/2019   HCT 35.3 (L) 02/09/2019   MCV 91.5 02/09/2019   PLT 401 (H) 02/09/2019   Lab Results  Component Value Date   CREATININE 1.35 (H) 02/09/2019   BUN 23 02/09/2019   NA 138 02/09/2019   K 4.4 02/09/2019   CL 106 02/09/2019   CO2 22 02/09/2019    Anesthesia Physical  Anesthesia Plan  ASA: III  Anesthesia Plan: General   Post-op Pain Management:    Induction: Intravenous  PONV Risk Score and Plan: 4 or greater and Ondansetron, Dexamethasone, Treatment may vary  due to age or medical condition and TIVA  Airway Management Planned: Oral ETT and Video Laryngoscope Planned  Additional Equipment: None  Intra-op Plan:   Post-operative Plan: Extubation in OR  Informed Consent: I have reviewed the patients History and Physical, chart, labs and discussed the procedure including the risks, benefits and alternatives for the proposed anesthesia with the patient or authorized  representative who has indicated his/her understanding and acceptance.     Dental advisory given  Plan Discussed with: CRNA  Anesthesia Plan Comments: ( )      Anesthesia Quick Evaluation

## 2019-02-11 ENCOUNTER — Encounter (HOSPITAL_COMMUNITY): Payer: Self-pay | Admitting: Neurosurgery

## 2019-02-11 NOTE — Progress Notes (Signed)
Overall looks good.  Back pain much improved.  Mobilizing well.  Wound dressing dry.  Doing well following lumbar wound debridement.  Continue local wound care.  Complete IV antibiotic course tomorrow.  Possible discharge home over the next day or 2

## 2019-02-12 LAB — AEROBIC CULTURE W GRAM STAIN (SUPERFICIAL SPECIMEN)
Culture: NO GROWTH
Gram Stain: NONE SEEN

## 2019-02-12 LAB — CREATININE, SERUM
Creatinine, Ser: 1.29 mg/dL — ABNORMAL HIGH (ref 0.44–1.00)
GFR calc Af Amer: 45 mL/min — ABNORMAL LOW (ref >60)
GFR calc non Af Amer: 39 mL/min — ABNORMAL LOW (ref >60)

## 2019-02-12 NOTE — TOC Initial Note (Signed)
Transition of Care [facility]) - Initial/Assessment Note    Patient Details  Name: Erin Cooke MRN: [id number] Date of Birth: 07-16-1940  Transition of Care [facility]) CM/SW Contact:    Erin Collet, RN Phone Number: 02/12/2019, 1:59 PM  Clinical Narrative:               Erin Cooke w patient at bedside. She will return home at DC and continue care from [place] services. Requested [place] orders from [facility] phone- [phone], [phone]. Faxed  facesheet and notes, need to fax orders. Spoke to receptionist, able to accept.    Expected Discharge Plan: [place] Barriers to Discharge: Continued Medical Work up   Patient Goals and CMS Choice Patient states their goals for this hospitalization and ongoing recovery are:: to go home CMS Medicare.gov Compare Post Acute Care list provided to:: Patient(she would like to continue w company she was active with prior to admission) Choice offered to / list presented to : Patient  Expected Discharge Plan and Services Expected Discharge Plan: [place]                           DME Agency: ([facility]) Date DME Agency Contacted: 02/12/19 Time DME Agency Contacted: [id number]   [place] Arranged: RN [place] Agency: ([place] [phone])        Prior Living Arrangements/Services                       Activities of Daily Living      Permission Sought/Granted                  Emotional Assessment              Admission diagnosis:  lumbar wound infection Patient Active Problem List   Diagnosis Date Noted  . Wound infection after surgery 02/09/2019  . Lumbar stenosis with neurogenic claudication 01/17/2019  . RUQ pain 12/19/2018  . Nausea with vomiting 12/19/2018  . History of colon cancer 02/03/2016  . Rectal bleeding 04/30/2015  . CAD (coronary artery disease) 04/30/2015  . Acute blood loss anemia 04/30/2015  . Rectal bleed 04/30/2015  .  Synovial cyst of lumbar facet joint 10/10/2013   PCP:  Delanna Notice, MD Pharmacy:   [id number] - [place], [place] [address] Phone: [phone] Fax: [phone]  [place], [facility] [address] [place] [address] [place] [address] Phone: [phone] Fax: [phone]     Social Determinants of Health (SDOH) Interventions    Readmission Risk Interventions No flowsheet data found.

## 2019-02-13 MED ORDER — CIPROFLOXACIN HCL 500 MG PO TABS: 500.0000 mg | ORAL_TABLET | Freq: Two times a day (BID) | ORAL | 0 refills | Status: AC

## 2019-02-13 MED ORDER — OXYCODONE HCL 5 MG PO TABS: 5.0000 mg | ORAL_TABLET | Freq: Four times a day (QID) | ORAL | 0 refills | Status: DC | PRN

## 2019-02-13 NOTE — Plan of Care (Signed)

## 2019-02-13 NOTE — Care Management Important Message (Signed)
Important Message  Patient Details  Name: Erin Cooke MRN: [id number] Date of Birth: [birth year]   Medicare Important Message Given:  Yes     Memory Argue 02/13/2019, 1:55 PM    IM GIVEN TO PATIENT BEFORE DISCHARGE

## 2019-02-13 NOTE — Plan of Care (Signed)
Problem: Education: Goal: Knowledge of General Education information will improve Description: Including pain rating scale, medication(s)/side effects and non-pharmacologic comfort measures 02/13/2019 1410 by Lawanda Cousins, RN Outcome: Completed/Met 02/13/2019 1409 by Lawanda Cousins, RN Outcome: Progressing   Problem: Health Behavior/Discharge Planning: Goal: Ability to manage health-related needs will improve 02/13/2019 1410 by Lawanda Cousins, RN Outcome: Completed/Met 02/13/2019 1409 by Lawanda Cousins, RN Outcome: Progressing   Problem: Clinical Measurements: Goal: Ability to maintain clinical measurements within normal limits will improve 02/13/2019 1410 by Lawanda Cousins, RN Outcome: Completed/Met 02/13/2019 1409 by Lawanda Cousins, RN Outcome: Progressing Goal: Will remain free from infection 02/13/2019 1410 by Lawanda Cousins, RN Outcome: Completed/Met 02/13/2019 1409 by Lawanda Cousins, RN Outcome: Progressing Goal: Diagnostic test results will improve 02/13/2019 1410 by Lawanda Cousins, RN Outcome: Completed/Met 02/13/2019 1409 by Lawanda Cousins, RN Outcome: Progressing Goal: Respiratory complications will improve 02/13/2019 1410 by Lawanda Cousins, RN Outcome: Completed/Met 02/13/2019 1409 by Lawanda Cousins, RN Outcome: Progressing Goal: Cardiovascular complication will be avoided 02/13/2019 1410 by Lawanda Cousins, RN Outcome: Completed/Met 02/13/2019 1409 by Lawanda Cousins, RN Outcome: Progressing   Problem: Activity: Goal: Risk for activity intolerance will decrease 02/13/2019 1410 by Lawanda Cousins, RN Outcome: Completed/Met 02/13/2019 1409 by Lawanda Cousins, RN Outcome: Progressing   Problem: Nutrition: Goal: Adequate nutrition will be maintained 02/13/2019 1410 by Lawanda Cousins, RN Outcome: Completed/Met 02/13/2019 1409 by Lawanda Cousins, RN Outcome: Progressing   Problem: Coping: Goal: Level of anxiety will decrease 02/13/2019 1410 by Lawanda Cousins, RN Outcome: Completed/Met 02/13/2019 1409 by Lawanda Cousins, RN Outcome: Progressing   Problem: Elimination: Goal: Will not experience complications related to bowel motility 02/13/2019 1410 by Lawanda Cousins, RN Outcome: Completed/Met 02/13/2019 1409 by Lawanda Cousins, RN Outcome: Progressing Goal: Will not experience complications related to urinary retention 02/13/2019 1410 by Lawanda Cousins, RN Outcome: Completed/Met 02/13/2019 1409 by Lawanda Cousins, RN Outcome: Progressing   Problem: Pain Managment: Goal: General experience of comfort will improve 02/13/2019 1410 by Lawanda Cousins, RN Outcome: Completed/Met 02/13/2019 1409 by Lawanda Cousins, RN Outcome: Progressing   Problem: Safety: Goal: Ability to remain free from injury will improve 02/13/2019 1410 by Lawanda Cousins, RN Outcome: Completed/Met 02/13/2019 1409 by Lawanda Cousins, RN Outcome: Progressing   Problem: Skin Integrity: Goal: Risk for impaired skin integrity will decrease 02/13/2019 1410 by Lawanda Cousins, RN Outcome: Completed/Met 02/13/2019 1409 by Lawanda Cousins, RN Outcome: Progressing   Problem: Education: Goal: Ability to verbalize activity precautions or restrictions will improve 02/13/2019 1410 by Lawanda Cousins, RN Outcome: Completed/Met 02/13/2019 1409 by Lawanda Cousins, RN Outcome: Progressing Goal: Knowledge of the prescribed therapeutic regimen will improve 02/13/2019 1410 by Lawanda Cousins, RN Outcome: Completed/Met 02/13/2019 1409 by Lawanda Cousins, RN Outcome: Progressing Goal: Understanding of discharge needs will improve 02/13/2019 1410 by Lawanda Cousins, RN Outcome: Completed/Met 02/13/2019 1409 by Lawanda Cousins, RN Outcome: Progressing   Problem: Activity: Goal: Ability to avoid complications of mobility impairment will improve 02/13/2019 1410 by Lawanda Cousins, RN Outcome: Completed/Met 02/13/2019 1409 by Lawanda Cousins, RN Outcome: Progressing Goal: Ability  to tolerate increased activity will improve 02/13/2019 1410 by Lawanda Cousins, RN Outcome: Completed/Met 02/13/2019 1409 by Lawanda Cousins, RN Outcome: Progressing Goal: Will remain free from falls 02/13/2019 1410 by Lawanda Cousins, RN Outcome: Completed/Met 02/13/2019 1409 by Lawanda Cousins,  RN Outcome: Progressing   Problem: Bowel/Gastric: Goal: Gastrointestinal status for postoperative course will improve 02/13/2019 1410 by Lawanda Cousins, RN Outcome: Completed/Met 02/13/2019 1409 by Lawanda Cousins, RN Outcome: Progressing   Problem: Clinical Measurements: Goal: Ability to maintain clinical measurements within normal limits will improve 02/13/2019 1410 by Lawanda Cousins, RN Outcome: Completed/Met 02/13/2019 1409 by Lawanda Cousins, RN Outcome: Progressing Goal: Postoperative complications will be avoided or minimized 02/13/2019 1410 by Lawanda Cousins, RN Outcome: Completed/Met 02/13/2019 1409 by Lawanda Cousins, RN Outcome: Progressing Goal: Diagnostic test results will improve 02/13/2019 1410 by Lawanda Cousins, RN Outcome: Completed/Met 02/13/2019 1409 by Lawanda Cousins, RN Outcome: Progressing   Problem: Pain Management: Goal: Pain level will decrease 02/13/2019 1410 by Lawanda Cousins, RN Outcome: Completed/Met 02/13/2019 1409 by Lawanda Cousins, RN Outcome: Progressing   Problem: Skin Integrity: Goal: Will show signs of wound healing 02/13/2019 1410 by Lawanda Cousins, RN Outcome: Completed/Met 02/13/2019 1409 by Lawanda Cousins, RN Outcome: Progressing   Problem: Health Behavior/Discharge Planning: Goal: Identification of resources available to assist in meeting health care needs will improve 02/13/2019 1410 by Lawanda Cousins, RN Outcome: Completed/Met 02/13/2019 1409 by Lawanda Cousins, RN Outcome: Progressing   Problem: Bladder/Genitourinary: Goal: Urinary functional status for postoperative course will improve 02/13/2019 1410 by Lawanda Cousins,  RN Outcome: Completed/Met 02/13/2019 1409 by Lawanda Cousins, RN Outcome: Progressing

## 2019-02-13 NOTE — Discharge Instructions (Addendum)
Dressing to be changed once per day  [place] Follow up.   Why: They will resume Tracy you were receiving before you came to hospital Contact information: Ph- [phone] Fax [phone]

## 2019-02-13 NOTE — Discharge Summary (Signed)
Physician Discharge Summary  Patient ID: Erin Cooke MRN: [id number] DOB/AGE: October 02, 1940 78 y.o.  Admit date: 02/09/2019 Discharge date: 02/13/2019  Admission Diagnoses:wound infection  Discharge Diagnoses:  Active Problems:   Wound infection after surgery   Discharged Condition: good  Hospital Course: Erin Cooke was admitted and taken to the operating room for an uncomplicated wound exploration. There was no deep infection, only superficial. She is doing much better, and ready for discharge.  Treatments: surgery: wound exploration  Discharge Exam: Blood pressure 138/62, pulse 71, temperature 98.2 F (36.8 C), temperature source Oral, resp. rate 18, SpO2 95 %. General appearance: alert, cooperative, appears stated age and no distress  Disposition: Discharge disposition: 01-Home or Self Care      Lumbar wound infection  Allergies as of 02/13/2019      Reactions   Demerol [meperidine Hcl] Nausea And Vomiting   Forane [isoflurane] Other (See Comments)   Elevated liver enzymes   Letrozole Other (See Comments)   Muscle pain      Medication List    TAKE these medications   acetaminophen 500 MG tablet Commonly known as: TYLENOL Take 1,000 mg by mouth 2 (two) times daily as needed for moderate pain or headache.   aspirin EC 81 MG tablet Take 81 mg by mouth every evening.   atorvastatin 80 MG tablet Commonly known as: LIPITOR Take 80 mg by mouth every evening.   BENEFIBER PO Take 1 Dose by mouth daily.   ciprofloxacin 500 MG tablet Commonly known as: Cipro Take 1 tablet (500 mg total) by mouth 2 (two) times daily for 10 days.   famotidine 20 MG tablet Commonly known as: PEPCID Take 20 mg by mouth at bedtime.   furosemide 20 MG tablet Commonly known as: LASIX Take 20 mg by mouth every other day.   loperamide 2 MG capsule Commonly known as: IMODIUM Take 4 mg by mouth 4 (four) times daily as needed for diarrhea or loose stools.   losartan 50 MG  tablet Commonly known as: COZAAR Take 50 mg by mouth daily.   metoprolol tartrate 100 MG tablet Commonly known as: LOPRESSOR Take 100 mg by mouth 2 (two) times daily.   multivitamin with minerals Tabs tablet Take 1 tablet by mouth daily. Centrum Silver   nitroGLYCERIN 0.4 MG SL tablet Commonly known as: NITROSTAT Place 0.4 mg under the tongue every 5 (five) minutes as needed for chest pain.   omeprazole 20 MG tablet Commonly known as: PRILOSEC OTC Take 20 mg by mouth every morning.   oxyCODONE 5 MG immediate release tablet Commonly known as: Oxy IR/ROXICODONE Take 1 tablet (5 mg total) by mouth every 6 (six) hours as needed for moderate pain. What changed: reasons to take this   Alpine PO Take 1 capsule by mouth daily.   prednisoLONE acetate 1 % ophthalmic suspension Commonly known as: PRED FORTE Place 1 drop into both eyes See admin instructions. Instill 1 drop into the right eye three times a day and 1 drop into the left eye in the morning (only)   Systane 0.4-0.3 % Gel ophthalmic gel Generic drug: Polyethyl Glycol-Propyl Glycol Place 1 application into both eyes 4 (four) times daily.   Systane Nighttime Oint Place 1 application into both eyes at bedtime.      Follow-up [facility] Follow up.   Why: They will resume [place] you were receiving before you came to hospital Contact information: Ph- [phone] Fax [phone]  SignedAshok Pall 02/13/2019, 1:00 PM

## 2019-02-13 NOTE — TOC Transition Note (Signed)
Transition of Care [facility]) - CM/SW Discharge Note Marvetta Gibbons RN,BSN Transitions of Care Unit 4NP (non trauma) - RN Case Manager [phone]   Patient Details  Name: Erin Cooke MRN: [id number] Date of Birth: [birth year]  [facility]) CM/SW Contact:  Dawayne Patricia, RN Phone Number: 02/13/2019, 1:42 PM   Clinical Narrative:    Pt stable for transition home today, orders placed for HHPT- faxed to [place]- call made to agency and spoke with Arville Go- they have received fax and will resume HHPT services- pt does not meet skilled guidelines for [facility]- agency will f/u and be sure husband/pt are doing drsg changes. Pt to return home today with spouse.    Final next level of care: [place] Barriers to Discharge: Barriers Resolved   Patient Goals and CMS Choice Patient states their goals for this hospitalization and ongoing recovery are:: to go home CMS Medicare.gov Compare Post Acute Care list provided to:: Patient(she would like to continue w company she was active with prior to admission) Choice offered to / list presented to : Patient  Discharge Placement             Home [facility]          Discharge Plan and Services                  DME Agency: ([facility]) Date DME Agency Contacted: 02/12/19 Time DME Agency Contacted: [id number]   [place]: ([place] [phone]) Date HH Agency Contacted: 02/13/19 Time [place]: [place] Representative spoke with at [place]: [place] ([place]) Interventions     Readmission Risk Interventions Readmission Risk Prevention Plan 02/13/2019  Post Dischage Appt Complete  Medication Screening Complete  Transportation Screening Complete  Some recent data might be hidden

## 2019-02-14 DIAGNOSIS — I252 Old myocardial infarction: Secondary | ICD-10-CM | POA: Diagnosis not present

## 2019-02-14 DIAGNOSIS — Z48811 Encounter for surgical aftercare following surgery on the nervous system: Secondary | ICD-10-CM | POA: Diagnosis not present

## 2019-02-14 DIAGNOSIS — M48062 Spinal stenosis, lumbar region with neurogenic claudication: Secondary | ICD-10-CM | POA: Diagnosis not present

## 2019-02-14 DIAGNOSIS — I509 Heart failure, unspecified: Secondary | ICD-10-CM | POA: Diagnosis not present

## 2019-02-14 DIAGNOSIS — I340 Nonrheumatic mitral (valve) insufficiency: Secondary | ICD-10-CM | POA: Diagnosis not present

## 2019-02-14 DIAGNOSIS — R2689 Other abnormalities of gait and mobility: Secondary | ICD-10-CM | POA: Diagnosis not present

## 2019-02-14 DIAGNOSIS — I2510 Atherosclerotic heart disease of native coronary artery without angina pectoris: Secondary | ICD-10-CM | POA: Diagnosis not present

## 2019-02-14 DIAGNOSIS — N189 Chronic kidney disease, unspecified: Secondary | ICD-10-CM | POA: Diagnosis not present

## 2019-02-14 DIAGNOSIS — R41841 Cognitive communication deficit: Secondary | ICD-10-CM | POA: Diagnosis not present

## 2019-02-14 DIAGNOSIS — M6281 Muscle weakness (generalized): Secondary | ICD-10-CM | POA: Diagnosis not present

## 2019-02-14 DIAGNOSIS — K219 Gastro-esophageal reflux disease without esophagitis: Secondary | ICD-10-CM | POA: Diagnosis not present

## 2019-02-14 DIAGNOSIS — T8140XD Infection following a procedure, unspecified, subsequent encounter: Secondary | ICD-10-CM | POA: Diagnosis not present

## 2019-03-01 DIAGNOSIS — Z23 Encounter for immunization: Secondary | ICD-10-CM | POA: Diagnosis not present

## 2019-03-05 DIAGNOSIS — M48062 Spinal stenosis, lumbar region with neurogenic claudication: Secondary | ICD-10-CM | POA: Diagnosis not present

## 2019-03-16 DIAGNOSIS — M48062 Spinal stenosis, lumbar region with neurogenic claudication: Secondary | ICD-10-CM | POA: Diagnosis not present

## 2019-03-16 DIAGNOSIS — R2689 Other abnormalities of gait and mobility: Secondary | ICD-10-CM | POA: Diagnosis not present

## 2019-03-16 DIAGNOSIS — M6281 Muscle weakness (generalized): Secondary | ICD-10-CM | POA: Diagnosis not present

## 2019-03-16 DIAGNOSIS — T8140XD Infection following a procedure, unspecified, subsequent encounter: Secondary | ICD-10-CM | POA: Diagnosis not present

## 2019-03-16 DIAGNOSIS — Z48811 Encounter for surgical aftercare following surgery on the nervous system: Secondary | ICD-10-CM | POA: Diagnosis not present

## 2019-03-16 DIAGNOSIS — R41841 Cognitive communication deficit: Secondary | ICD-10-CM | POA: Diagnosis not present

## 2019-03-19 DIAGNOSIS — R41841 Cognitive communication deficit: Secondary | ICD-10-CM | POA: Diagnosis not present

## 2019-03-19 DIAGNOSIS — Z48811 Encounter for surgical aftercare following surgery on the nervous system: Secondary | ICD-10-CM | POA: Diagnosis not present

## 2019-03-19 DIAGNOSIS — M6281 Muscle weakness (generalized): Secondary | ICD-10-CM | POA: Diagnosis not present

## 2019-03-19 DIAGNOSIS — H04123 Dry eye syndrome of bilateral lacrimal glands: Secondary | ICD-10-CM | POA: Diagnosis not present

## 2019-03-19 DIAGNOSIS — R2689 Other abnormalities of gait and mobility: Secondary | ICD-10-CM | POA: Diagnosis not present

## 2019-03-19 DIAGNOSIS — T8140XD Infection following a procedure, unspecified, subsequent encounter: Secondary | ICD-10-CM | POA: Diagnosis not present

## 2019-03-19 DIAGNOSIS — H0288B Meibomian gland dysfunction left eye, upper and lower eyelids: Secondary | ICD-10-CM | POA: Diagnosis not present

## 2019-03-19 DIAGNOSIS — H0288A Meibomian gland dysfunction right eye, upper and lower eyelids: Secondary | ICD-10-CM | POA: Diagnosis not present

## 2019-03-19 DIAGNOSIS — M48062 Spinal stenosis, lumbar region with neurogenic claudication: Secondary | ICD-10-CM | POA: Diagnosis not present

## 2019-03-20 ENCOUNTER — Ambulatory Visit (INDEPENDENT_AMBULATORY_CARE_PROVIDER_SITE_OTHER): Payer: Medicare Other | Admitting: Internal Medicine

## 2019-03-20 DIAGNOSIS — R41841 Cognitive communication deficit: Secondary | ICD-10-CM | POA: Diagnosis not present

## 2019-03-20 DIAGNOSIS — Z48811 Encounter for surgical aftercare following surgery on the nervous system: Secondary | ICD-10-CM | POA: Diagnosis not present

## 2019-03-20 DIAGNOSIS — M48062 Spinal stenosis, lumbar region with neurogenic claudication: Secondary | ICD-10-CM | POA: Diagnosis not present

## 2019-03-20 DIAGNOSIS — M6281 Muscle weakness (generalized): Secondary | ICD-10-CM | POA: Diagnosis not present

## 2019-03-20 DIAGNOSIS — T8140XD Infection following a procedure, unspecified, subsequent encounter: Secondary | ICD-10-CM | POA: Diagnosis not present

## 2019-03-20 DIAGNOSIS — R2689 Other abnormalities of gait and mobility: Secondary | ICD-10-CM | POA: Diagnosis not present

## 2019-03-21 DIAGNOSIS — R41841 Cognitive communication deficit: Secondary | ICD-10-CM | POA: Diagnosis not present

## 2019-03-21 DIAGNOSIS — R2689 Other abnormalities of gait and mobility: Secondary | ICD-10-CM | POA: Diagnosis not present

## 2019-03-21 DIAGNOSIS — T8140XD Infection following a procedure, unspecified, subsequent encounter: Secondary | ICD-10-CM | POA: Diagnosis not present

## 2019-03-21 DIAGNOSIS — M6281 Muscle weakness (generalized): Secondary | ICD-10-CM | POA: Diagnosis not present

## 2019-03-21 DIAGNOSIS — Z48811 Encounter for surgical aftercare following surgery on the nervous system: Secondary | ICD-10-CM | POA: Diagnosis not present

## 2019-03-21 DIAGNOSIS — M48062 Spinal stenosis, lumbar region with neurogenic claudication: Secondary | ICD-10-CM | POA: Diagnosis not present

## 2019-03-23 DIAGNOSIS — Z48811 Encounter for surgical aftercare following surgery on the nervous system: Secondary | ICD-10-CM | POA: Diagnosis not present

## 2019-03-23 DIAGNOSIS — M48062 Spinal stenosis, lumbar region with neurogenic claudication: Secondary | ICD-10-CM | POA: Diagnosis not present

## 2019-03-23 DIAGNOSIS — T8140XD Infection following a procedure, unspecified, subsequent encounter: Secondary | ICD-10-CM | POA: Diagnosis not present

## 2019-03-23 DIAGNOSIS — R2689 Other abnormalities of gait and mobility: Secondary | ICD-10-CM | POA: Diagnosis not present

## 2019-03-23 DIAGNOSIS — R41841 Cognitive communication deficit: Secondary | ICD-10-CM | POA: Diagnosis not present

## 2019-03-23 DIAGNOSIS — M6281 Muscle weakness (generalized): Secondary | ICD-10-CM | POA: Diagnosis not present

## 2019-03-26 DIAGNOSIS — T8140XD Infection following a procedure, unspecified, subsequent encounter: Secondary | ICD-10-CM | POA: Diagnosis not present

## 2019-03-26 DIAGNOSIS — Z48811 Encounter for surgical aftercare following surgery on the nervous system: Secondary | ICD-10-CM | POA: Diagnosis not present

## 2019-03-26 DIAGNOSIS — M6281 Muscle weakness (generalized): Secondary | ICD-10-CM | POA: Diagnosis not present

## 2019-03-26 DIAGNOSIS — R2689 Other abnormalities of gait and mobility: Secondary | ICD-10-CM | POA: Diagnosis not present

## 2019-03-26 DIAGNOSIS — R41841 Cognitive communication deficit: Secondary | ICD-10-CM | POA: Diagnosis not present

## 2019-03-26 DIAGNOSIS — M48062 Spinal stenosis, lumbar region with neurogenic claudication: Secondary | ICD-10-CM | POA: Diagnosis not present

## 2019-03-27 DIAGNOSIS — T8140XD Infection following a procedure, unspecified, subsequent encounter: Secondary | ICD-10-CM | POA: Diagnosis not present

## 2019-03-27 DIAGNOSIS — R41841 Cognitive communication deficit: Secondary | ICD-10-CM | POA: Diagnosis not present

## 2019-03-27 DIAGNOSIS — M48062 Spinal stenosis, lumbar region with neurogenic claudication: Secondary | ICD-10-CM | POA: Diagnosis not present

## 2019-03-27 DIAGNOSIS — R2689 Other abnormalities of gait and mobility: Secondary | ICD-10-CM | POA: Diagnosis not present

## 2019-03-27 DIAGNOSIS — M6281 Muscle weakness (generalized): Secondary | ICD-10-CM | POA: Diagnosis not present

## 2019-03-27 DIAGNOSIS — Z48811 Encounter for surgical aftercare following surgery on the nervous system: Secondary | ICD-10-CM | POA: Diagnosis not present

## 2019-03-30 DIAGNOSIS — R2689 Other abnormalities of gait and mobility: Secondary | ICD-10-CM | POA: Diagnosis not present

## 2019-03-30 DIAGNOSIS — T8140XD Infection following a procedure, unspecified, subsequent encounter: Secondary | ICD-10-CM | POA: Diagnosis not present

## 2019-03-30 DIAGNOSIS — M48062 Spinal stenosis, lumbar region with neurogenic claudication: Secondary | ICD-10-CM | POA: Diagnosis not present

## 2019-03-30 DIAGNOSIS — M6281 Muscle weakness (generalized): Secondary | ICD-10-CM | POA: Diagnosis not present

## 2019-03-30 DIAGNOSIS — Z48811 Encounter for surgical aftercare following surgery on the nervous system: Secondary | ICD-10-CM | POA: Diagnosis not present

## 2019-03-30 DIAGNOSIS — R41841 Cognitive communication deficit: Secondary | ICD-10-CM | POA: Diagnosis not present

## 2019-04-03 DIAGNOSIS — T8140XD Infection following a procedure, unspecified, subsequent encounter: Secondary | ICD-10-CM | POA: Diagnosis not present

## 2019-04-03 DIAGNOSIS — R41841 Cognitive communication deficit: Secondary | ICD-10-CM | POA: Diagnosis not present

## 2019-04-03 DIAGNOSIS — M6281 Muscle weakness (generalized): Secondary | ICD-10-CM | POA: Diagnosis not present

## 2019-04-03 DIAGNOSIS — R2689 Other abnormalities of gait and mobility: Secondary | ICD-10-CM | POA: Diagnosis not present

## 2019-04-03 DIAGNOSIS — Z48811 Encounter for surgical aftercare following surgery on the nervous system: Secondary | ICD-10-CM | POA: Diagnosis not present

## 2019-04-03 DIAGNOSIS — M48062 Spinal stenosis, lumbar region with neurogenic claudication: Secondary | ICD-10-CM | POA: Diagnosis not present

## 2019-04-05 DIAGNOSIS — T8140XD Infection following a procedure, unspecified, subsequent encounter: Secondary | ICD-10-CM | POA: Diagnosis not present

## 2019-04-05 DIAGNOSIS — Z48811 Encounter for surgical aftercare following surgery on the nervous system: Secondary | ICD-10-CM | POA: Diagnosis not present

## 2019-04-05 DIAGNOSIS — M6281 Muscle weakness (generalized): Secondary | ICD-10-CM | POA: Diagnosis not present

## 2019-04-05 DIAGNOSIS — M48062 Spinal stenosis, lumbar region with neurogenic claudication: Secondary | ICD-10-CM | POA: Diagnosis not present

## 2019-04-05 DIAGNOSIS — R41841 Cognitive communication deficit: Secondary | ICD-10-CM | POA: Diagnosis not present

## 2019-04-05 DIAGNOSIS — R2689 Other abnormalities of gait and mobility: Secondary | ICD-10-CM | POA: Diagnosis not present

## 2019-04-30 DIAGNOSIS — M48062 Spinal stenosis, lumbar region with neurogenic claudication: Secondary | ICD-10-CM | POA: Diagnosis not present

## 2019-05-02 DIAGNOSIS — M48062 Spinal stenosis, lumbar region with neurogenic claudication: Secondary | ICD-10-CM | POA: Diagnosis not present

## 2019-05-09 DIAGNOSIS — Z1283 Encounter for screening for malignant neoplasm of skin: Secondary | ICD-10-CM | POA: Diagnosis not present

## 2019-05-09 DIAGNOSIS — L570 Actinic keratosis: Secondary | ICD-10-CM | POA: Diagnosis not present

## 2019-05-09 DIAGNOSIS — X32XXXD Exposure to sunlight, subsequent encounter: Secondary | ICD-10-CM | POA: Diagnosis not present

## 2019-05-09 DIAGNOSIS — D225 Melanocytic nevi of trunk: Secondary | ICD-10-CM | POA: Diagnosis not present

## 2019-05-09 DIAGNOSIS — D0462 Carcinoma in situ of skin of left upper limb, including shoulder: Secondary | ICD-10-CM | POA: Diagnosis not present

## 2019-05-09 DIAGNOSIS — L821 Other seborrheic keratosis: Secondary | ICD-10-CM | POA: Diagnosis not present

## 2019-05-10 DIAGNOSIS — M48062 Spinal stenosis, lumbar region with neurogenic claudication: Secondary | ICD-10-CM | POA: Diagnosis not present

## 2019-05-11 DIAGNOSIS — M48062 Spinal stenosis, lumbar region with neurogenic claudication: Secondary | ICD-10-CM | POA: Diagnosis not present

## 2019-05-13 DIAGNOSIS — Z23 Encounter for immunization: Secondary | ICD-10-CM | POA: Diagnosis not present

## 2019-05-14 DIAGNOSIS — M48062 Spinal stenosis, lumbar region with neurogenic claudication: Secondary | ICD-10-CM | POA: Diagnosis not present

## 2019-05-17 DIAGNOSIS — M48062 Spinal stenosis, lumbar region with neurogenic claudication: Secondary | ICD-10-CM | POA: Diagnosis not present

## 2019-05-21 DIAGNOSIS — M48062 Spinal stenosis, lumbar region with neurogenic claudication: Secondary | ICD-10-CM | POA: Diagnosis not present

## 2019-05-25 DIAGNOSIS — C182 Malignant neoplasm of ascending colon: Secondary | ICD-10-CM | POA: Diagnosis not present

## 2019-05-25 DIAGNOSIS — D509 Iron deficiency anemia, unspecified: Secondary | ICD-10-CM | POA: Diagnosis not present

## 2019-05-25 DIAGNOSIS — R635 Abnormal weight gain: Secondary | ICD-10-CM | POA: Diagnosis not present

## 2019-05-25 DIAGNOSIS — Z853 Personal history of malignant neoplasm of breast: Secondary | ICD-10-CM | POA: Diagnosis not present

## 2019-05-30 DIAGNOSIS — I2510 Atherosclerotic heart disease of native coronary artery without angina pectoris: Secondary | ICD-10-CM | POA: Diagnosis not present

## 2019-05-30 DIAGNOSIS — E785 Hyperlipidemia, unspecified: Secondary | ICD-10-CM | POA: Diagnosis not present

## 2019-05-30 DIAGNOSIS — N289 Disorder of kidney and ureter, unspecified: Secondary | ICD-10-CM | POA: Diagnosis not present

## 2019-05-30 DIAGNOSIS — I429 Cardiomyopathy, unspecified: Secondary | ICD-10-CM | POA: Diagnosis not present

## 2019-05-30 DIAGNOSIS — C182 Malignant neoplasm of ascending colon: Secondary | ICD-10-CM | POA: Diagnosis not present

## 2019-05-30 DIAGNOSIS — I340 Nonrheumatic mitral (valve) insufficiency: Secondary | ICD-10-CM | POA: Diagnosis not present

## 2019-05-31 DIAGNOSIS — M48062 Spinal stenosis, lumbar region with neurogenic claudication: Secondary | ICD-10-CM | POA: Diagnosis not present

## 2019-06-04 DIAGNOSIS — Z6841 Body Mass Index (BMI) 40.0 and over, adult: Secondary | ICD-10-CM | POA: Diagnosis not present

## 2019-06-04 DIAGNOSIS — M79604 Pain in right leg: Secondary | ICD-10-CM | POA: Diagnosis not present

## 2019-06-04 DIAGNOSIS — M48062 Spinal stenosis, lumbar region with neurogenic claudication: Secondary | ICD-10-CM | POA: Diagnosis not present

## 2019-06-04 DIAGNOSIS — I10 Essential (primary) hypertension: Secondary | ICD-10-CM | POA: Diagnosis not present

## 2019-06-05 DIAGNOSIS — Z23 Encounter for immunization: Secondary | ICD-10-CM | POA: Diagnosis not present

## 2019-06-13 DIAGNOSIS — M48062 Spinal stenosis, lumbar region with neurogenic claudication: Secondary | ICD-10-CM | POA: Diagnosis not present

## 2019-06-14 DIAGNOSIS — M48062 Spinal stenosis, lumbar region with neurogenic claudication: Secondary | ICD-10-CM | POA: Diagnosis not present

## 2019-06-20 DIAGNOSIS — R7989 Other specified abnormal findings of blood chemistry: Secondary | ICD-10-CM | POA: Diagnosis not present

## 2019-06-20 DIAGNOSIS — I5020 Unspecified systolic (congestive) heart failure: Secondary | ICD-10-CM | POA: Diagnosis not present

## 2019-06-20 DIAGNOSIS — C189 Malignant neoplasm of colon, unspecified: Secondary | ICD-10-CM | POA: Diagnosis not present

## 2019-06-20 DIAGNOSIS — N183 Chronic kidney disease, stage 3 unspecified: Secondary | ICD-10-CM | POA: Diagnosis not present

## 2019-06-20 DIAGNOSIS — I2510 Atherosclerotic heart disease of native coronary artery without angina pectoris: Secondary | ICD-10-CM | POA: Diagnosis not present

## 2019-06-20 DIAGNOSIS — I10 Essential (primary) hypertension: Secondary | ICD-10-CM | POA: Diagnosis not present

## 2019-06-21 DIAGNOSIS — D0462 Carcinoma in situ of skin of left upper limb, including shoulder: Secondary | ICD-10-CM | POA: Diagnosis not present

## 2019-07-04 DIAGNOSIS — Z5181 Encounter for therapeutic drug level monitoring: Secondary | ICD-10-CM | POA: Diagnosis not present

## 2019-07-04 DIAGNOSIS — M5416 Radiculopathy, lumbar region: Secondary | ICD-10-CM | POA: Diagnosis not present

## 2019-07-05 DIAGNOSIS — M48062 Spinal stenosis, lumbar region with neurogenic claudication: Secondary | ICD-10-CM | POA: Diagnosis not present

## 2019-07-09 DIAGNOSIS — N289 Disorder of kidney and ureter, unspecified: Secondary | ICD-10-CM | POA: Diagnosis not present

## 2019-07-09 DIAGNOSIS — I429 Cardiomyopathy, unspecified: Secondary | ICD-10-CM | POA: Diagnosis not present

## 2019-07-09 DIAGNOSIS — C182 Malignant neoplasm of ascending colon: Secondary | ICD-10-CM | POA: Diagnosis not present

## 2019-07-09 DIAGNOSIS — I340 Nonrheumatic mitral (valve) insufficiency: Secondary | ICD-10-CM | POA: Diagnosis not present

## 2019-07-09 DIAGNOSIS — E785 Hyperlipidemia, unspecified: Secondary | ICD-10-CM | POA: Diagnosis not present

## 2019-07-09 DIAGNOSIS — I2510 Atherosclerotic heart disease of native coronary artery without angina pectoris: Secondary | ICD-10-CM | POA: Diagnosis not present

## 2019-07-10 DIAGNOSIS — Z6841 Body Mass Index (BMI) 40.0 and over, adult: Secondary | ICD-10-CM | POA: Diagnosis not present

## 2019-07-10 DIAGNOSIS — I10 Essential (primary) hypertension: Secondary | ICD-10-CM | POA: Diagnosis not present

## 2019-07-10 DIAGNOSIS — M48062 Spinal stenosis, lumbar region with neurogenic claudication: Secondary | ICD-10-CM | POA: Diagnosis not present

## 2019-07-11 DIAGNOSIS — N183 Chronic kidney disease, stage 3 unspecified: Secondary | ICD-10-CM | POA: Diagnosis not present

## 2019-07-16 DIAGNOSIS — Z1231 Encounter for screening mammogram for malignant neoplasm of breast: Secondary | ICD-10-CM | POA: Diagnosis not present

## 2019-07-19 DIAGNOSIS — X32XXXD Exposure to sunlight, subsequent encounter: Secondary | ICD-10-CM | POA: Diagnosis not present

## 2019-07-19 DIAGNOSIS — D0462 Carcinoma in situ of skin of left upper limb, including shoulder: Secondary | ICD-10-CM | POA: Diagnosis not present

## 2019-07-19 DIAGNOSIS — L570 Actinic keratosis: Secondary | ICD-10-CM | POA: Diagnosis not present

## 2019-07-24 DIAGNOSIS — C182 Malignant neoplasm of ascending colon: Secondary | ICD-10-CM | POA: Diagnosis not present

## 2019-07-24 DIAGNOSIS — Z853 Personal history of malignant neoplasm of breast: Secondary | ICD-10-CM | POA: Diagnosis not present

## 2019-08-02 DIAGNOSIS — X32XXXD Exposure to sunlight, subsequent encounter: Secondary | ICD-10-CM | POA: Diagnosis not present

## 2019-08-02 DIAGNOSIS — L570 Actinic keratosis: Secondary | ICD-10-CM | POA: Diagnosis not present

## 2019-08-02 DIAGNOSIS — Z85828 Personal history of other malignant neoplasm of skin: Secondary | ICD-10-CM | POA: Diagnosis not present

## 2019-08-02 DIAGNOSIS — Z08 Encounter for follow-up examination after completed treatment for malignant neoplasm: Secondary | ICD-10-CM | POA: Diagnosis not present

## 2019-09-10 DIAGNOSIS — Z961 Presence of intraocular lens: Secondary | ICD-10-CM | POA: Diagnosis not present

## 2019-09-10 DIAGNOSIS — H02889 Meibomian gland dysfunction of unspecified eye, unspecified eyelid: Secondary | ICD-10-CM | POA: Diagnosis not present

## 2019-09-10 DIAGNOSIS — Z947 Corneal transplant status: Secondary | ICD-10-CM | POA: Diagnosis not present

## 2019-09-10 DIAGNOSIS — H47292 Other optic atrophy, left eye: Secondary | ICD-10-CM | POA: Diagnosis not present

## 2019-09-25 DIAGNOSIS — I2510 Atherosclerotic heart disease of native coronary artery without angina pectoris: Secondary | ICD-10-CM | POA: Diagnosis not present

## 2019-09-25 DIAGNOSIS — I5020 Unspecified systolic (congestive) heart failure: Secondary | ICD-10-CM | POA: Diagnosis not present

## 2019-09-25 DIAGNOSIS — N183 Chronic kidney disease, stage 3 unspecified: Secondary | ICD-10-CM | POA: Diagnosis not present

## 2019-09-25 DIAGNOSIS — I10 Essential (primary) hypertension: Secondary | ICD-10-CM | POA: Diagnosis not present

## 2019-11-12 DIAGNOSIS — M48062 Spinal stenosis, lumbar region with neurogenic claudication: Secondary | ICD-10-CM | POA: Diagnosis not present

## 2019-11-12 DIAGNOSIS — Z6841 Body Mass Index (BMI) 40.0 and over, adult: Secondary | ICD-10-CM | POA: Diagnosis not present

## 2019-11-12 DIAGNOSIS — I10 Essential (primary) hypertension: Secondary | ICD-10-CM | POA: Diagnosis not present

## 2019-11-29 DIAGNOSIS — R262 Difficulty in walking, not elsewhere classified: Secondary | ICD-10-CM | POA: Diagnosis not present

## 2019-11-29 DIAGNOSIS — M48062 Spinal stenosis, lumbar region with neurogenic claudication: Secondary | ICD-10-CM | POA: Diagnosis not present

## 2019-12-06 DIAGNOSIS — R262 Difficulty in walking, not elsewhere classified: Secondary | ICD-10-CM | POA: Diagnosis not present

## 2019-12-06 DIAGNOSIS — M48062 Spinal stenosis, lumbar region with neurogenic claudication: Secondary | ICD-10-CM | POA: Diagnosis not present

## 2019-12-12 DIAGNOSIS — M48062 Spinal stenosis, lumbar region with neurogenic claudication: Secondary | ICD-10-CM | POA: Diagnosis not present

## 2019-12-12 DIAGNOSIS — R262 Difficulty in walking, not elsewhere classified: Secondary | ICD-10-CM | POA: Diagnosis not present

## 2019-12-14 DIAGNOSIS — R262 Difficulty in walking, not elsewhere classified: Secondary | ICD-10-CM | POA: Diagnosis not present

## 2019-12-14 DIAGNOSIS — M48062 Spinal stenosis, lumbar region with neurogenic claudication: Secondary | ICD-10-CM | POA: Diagnosis not present

## 2019-12-17 DIAGNOSIS — M48062 Spinal stenosis, lumbar region with neurogenic claudication: Secondary | ICD-10-CM | POA: Diagnosis not present

## 2019-12-17 DIAGNOSIS — R262 Difficulty in walking, not elsewhere classified: Secondary | ICD-10-CM | POA: Diagnosis not present

## 2019-12-20 DIAGNOSIS — M48062 Spinal stenosis, lumbar region with neurogenic claudication: Secondary | ICD-10-CM | POA: Diagnosis not present

## 2019-12-20 DIAGNOSIS — R262 Difficulty in walking, not elsewhere classified: Secondary | ICD-10-CM | POA: Diagnosis not present

## 2020-01-01 DIAGNOSIS — Z96661 Presence of right artificial ankle joint: Secondary | ICD-10-CM | POA: Diagnosis not present

## 2020-01-01 DIAGNOSIS — M19071 Primary osteoarthritis, right ankle and foot: Secondary | ICD-10-CM | POA: Diagnosis not present

## 2020-01-01 DIAGNOSIS — M79671 Pain in right foot: Secondary | ICD-10-CM | POA: Diagnosis not present

## 2020-01-01 DIAGNOSIS — M19171 Post-traumatic osteoarthritis, right ankle and foot: Secondary | ICD-10-CM | POA: Diagnosis not present

## 2020-01-16 DIAGNOSIS — R262 Difficulty in walking, not elsewhere classified: Secondary | ICD-10-CM | POA: Diagnosis not present

## 2020-01-16 DIAGNOSIS — M48062 Spinal stenosis, lumbar region with neurogenic claudication: Secondary | ICD-10-CM | POA: Diagnosis not present

## 2020-01-21 DIAGNOSIS — R262 Difficulty in walking, not elsewhere classified: Secondary | ICD-10-CM | POA: Diagnosis not present

## 2020-01-21 DIAGNOSIS — M48062 Spinal stenosis, lumbar region with neurogenic claudication: Secondary | ICD-10-CM | POA: Diagnosis not present

## 2020-01-22 DIAGNOSIS — R635 Abnormal weight gain: Secondary | ICD-10-CM | POA: Diagnosis not present

## 2020-01-22 DIAGNOSIS — C182 Malignant neoplasm of ascending colon: Secondary | ICD-10-CM | POA: Diagnosis not present

## 2020-01-22 DIAGNOSIS — D509 Iron deficiency anemia, unspecified: Secondary | ICD-10-CM | POA: Diagnosis not present

## 2020-01-22 DIAGNOSIS — Z853 Personal history of malignant neoplasm of breast: Secondary | ICD-10-CM | POA: Diagnosis not present

## 2020-01-25 DIAGNOSIS — M48062 Spinal stenosis, lumbar region with neurogenic claudication: Secondary | ICD-10-CM | POA: Diagnosis not present

## 2020-01-25 DIAGNOSIS — R262 Difficulty in walking, not elsewhere classified: Secondary | ICD-10-CM | POA: Diagnosis not present

## 2020-01-29 DIAGNOSIS — Z808 Family history of malignant neoplasm of other organs or systems: Secondary | ICD-10-CM | POA: Diagnosis not present

## 2020-01-29 DIAGNOSIS — I517 Cardiomegaly: Secondary | ICD-10-CM | POA: Diagnosis not present

## 2020-01-29 DIAGNOSIS — Z7982 Long term (current) use of aspirin: Secondary | ICD-10-CM | POA: Diagnosis not present

## 2020-01-29 DIAGNOSIS — I509 Heart failure, unspecified: Secondary | ICD-10-CM | POA: Diagnosis not present

## 2020-01-29 DIAGNOSIS — Z853 Personal history of malignant neoplasm of breast: Secondary | ICD-10-CM | POA: Diagnosis not present

## 2020-01-29 DIAGNOSIS — Z79899 Other long term (current) drug therapy: Secondary | ICD-10-CM | POA: Diagnosis not present

## 2020-01-29 DIAGNOSIS — Z803 Family history of malignant neoplasm of breast: Secondary | ICD-10-CM | POA: Diagnosis not present

## 2020-01-29 DIAGNOSIS — C182 Malignant neoplasm of ascending colon: Secondary | ICD-10-CM | POA: Diagnosis not present

## 2020-01-29 DIAGNOSIS — Z85038 Personal history of other malignant neoplasm of large intestine: Secondary | ICD-10-CM | POA: Diagnosis not present

## 2020-01-29 DIAGNOSIS — Z884 Allergy status to anesthetic agent status: Secondary | ICD-10-CM | POA: Diagnosis not present

## 2020-01-29 DIAGNOSIS — Z947 Corneal transplant status: Secondary | ICD-10-CM | POA: Diagnosis not present

## 2020-01-29 DIAGNOSIS — Z885 Allergy status to narcotic agent status: Secondary | ICD-10-CM | POA: Diagnosis not present

## 2020-01-29 DIAGNOSIS — R0781 Pleurodynia: Secondary | ICD-10-CM | POA: Diagnosis not present

## 2020-01-29 DIAGNOSIS — R911 Solitary pulmonary nodule: Secondary | ICD-10-CM | POA: Diagnosis not present

## 2020-01-29 DIAGNOSIS — E785 Hyperlipidemia, unspecified: Secondary | ICD-10-CM | POA: Diagnosis not present

## 2020-01-29 DIAGNOSIS — D509 Iron deficiency anemia, unspecified: Secondary | ICD-10-CM | POA: Diagnosis not present

## 2020-01-29 DIAGNOSIS — Z8049 Family history of malignant neoplasm of other genital organs: Secondary | ICD-10-CM | POA: Diagnosis not present

## 2020-01-29 DIAGNOSIS — Z08 Encounter for follow-up examination after completed treatment for malignant neoplasm: Secondary | ICD-10-CM | POA: Diagnosis not present

## 2020-01-29 DIAGNOSIS — Z8041 Family history of malignant neoplasm of ovary: Secondary | ICD-10-CM | POA: Diagnosis not present

## 2020-01-31 DIAGNOSIS — L570 Actinic keratosis: Secondary | ICD-10-CM | POA: Diagnosis not present

## 2020-01-31 DIAGNOSIS — B078 Other viral warts: Secondary | ICD-10-CM | POA: Diagnosis not present

## 2020-01-31 DIAGNOSIS — X32XXXD Exposure to sunlight, subsequent encounter: Secondary | ICD-10-CM | POA: Diagnosis not present

## 2020-01-31 DIAGNOSIS — Z85828 Personal history of other malignant neoplasm of skin: Secondary | ICD-10-CM | POA: Diagnosis not present

## 2020-01-31 DIAGNOSIS — Z08 Encounter for follow-up examination after completed treatment for malignant neoplasm: Secondary | ICD-10-CM | POA: Diagnosis not present

## 2020-02-01 DIAGNOSIS — Z23 Encounter for immunization: Secondary | ICD-10-CM | POA: Diagnosis not present

## 2020-02-04 DIAGNOSIS — R262 Difficulty in walking, not elsewhere classified: Secondary | ICD-10-CM | POA: Diagnosis not present

## 2020-02-04 DIAGNOSIS — M48062 Spinal stenosis, lumbar region with neurogenic claudication: Secondary | ICD-10-CM | POA: Diagnosis not present

## 2020-02-13 DIAGNOSIS — M48062 Spinal stenosis, lumbar region with neurogenic claudication: Secondary | ICD-10-CM | POA: Diagnosis not present

## 2020-02-13 DIAGNOSIS — R262 Difficulty in walking, not elsewhere classified: Secondary | ICD-10-CM | POA: Diagnosis not present

## 2020-02-18 DIAGNOSIS — I340 Nonrheumatic mitral (valve) insufficiency: Secondary | ICD-10-CM | POA: Diagnosis not present

## 2020-02-18 DIAGNOSIS — I429 Cardiomyopathy, unspecified: Secondary | ICD-10-CM | POA: Diagnosis not present

## 2020-02-18 DIAGNOSIS — I2510 Atherosclerotic heart disease of native coronary artery without angina pectoris: Secondary | ICD-10-CM | POA: Diagnosis not present

## 2020-02-18 DIAGNOSIS — N289 Disorder of kidney and ureter, unspecified: Secondary | ICD-10-CM | POA: Diagnosis not present

## 2020-02-18 DIAGNOSIS — E785 Hyperlipidemia, unspecified: Secondary | ICD-10-CM | POA: Diagnosis not present

## 2020-02-18 DIAGNOSIS — C182 Malignant neoplasm of ascending colon: Secondary | ICD-10-CM | POA: Diagnosis not present

## 2020-02-27 DIAGNOSIS — Z23 Encounter for immunization: Secondary | ICD-10-CM | POA: Diagnosis not present

## 2020-03-31 DIAGNOSIS — H02889 Meibomian gland dysfunction of unspecified eye, unspecified eyelid: Secondary | ICD-10-CM | POA: Diagnosis not present

## 2020-03-31 DIAGNOSIS — Z947 Corneal transplant status: Secondary | ICD-10-CM | POA: Diagnosis not present

## 2020-03-31 DIAGNOSIS — Z961 Presence of intraocular lens: Secondary | ICD-10-CM | POA: Diagnosis not present

## 2020-04-03 DIAGNOSIS — I2510 Atherosclerotic heart disease of native coronary artery without angina pectoris: Secondary | ICD-10-CM | POA: Diagnosis not present

## 2020-04-03 DIAGNOSIS — M19171 Post-traumatic osteoarthritis, right ankle and foot: Secondary | ICD-10-CM | POA: Diagnosis not present

## 2020-04-03 DIAGNOSIS — Z96661 Presence of right artificial ankle joint: Secondary | ICD-10-CM | POA: Diagnosis not present

## 2020-04-03 DIAGNOSIS — E785 Hyperlipidemia, unspecified: Secondary | ICD-10-CM | POA: Diagnosis not present

## 2020-04-03 DIAGNOSIS — M25571 Pain in right ankle and joints of right foot: Secondary | ICD-10-CM | POA: Diagnosis not present

## 2020-08-13 ENCOUNTER — Encounter (INDEPENDENT_AMBULATORY_CARE_PROVIDER_SITE_OTHER): Payer: Self-pay | Admitting: *Deleted

## 2020-09-01 ENCOUNTER — Other Ambulatory Visit (INDEPENDENT_AMBULATORY_CARE_PROVIDER_SITE_OTHER): Payer: Self-pay | Admitting: *Deleted

## 2020-09-01 ENCOUNTER — Telehealth (INDEPENDENT_AMBULATORY_CARE_PROVIDER_SITE_OTHER): Payer: Self-pay | Admitting: *Deleted

## 2020-09-01 ENCOUNTER — Encounter (INDEPENDENT_AMBULATORY_CARE_PROVIDER_SITE_OTHER): Payer: Self-pay | Admitting: *Deleted

## 2020-09-01 MED ORDER — PREPOPIK 10-3.5-12 MG-GM-GM PO PACK: 1.0000 | PACK | Freq: Once | ORAL | 0 refills | Status: AC

## 2020-09-01 NOTE — Telephone Encounter (Signed)
Done

## 2020-09-01 NOTE — Telephone Encounter (Signed)
Patient needs prepopik 

## 2020-09-02 ENCOUNTER — Other Ambulatory Visit (INDEPENDENT_AMBULATORY_CARE_PROVIDER_SITE_OTHER): Payer: Self-pay

## 2020-09-02 DIAGNOSIS — Z86010 Personal history of colonic polyps: Secondary | ICD-10-CM

## 2020-09-19 ENCOUNTER — Telehealth (INDEPENDENT_AMBULATORY_CARE_PROVIDER_SITE_OTHER): Payer: Self-pay | Admitting: *Deleted

## 2020-09-19 NOTE — Telephone Encounter (Signed)
Referring MD/PCP: zakhary  Procedure: tcs  Reason/Indication:  Hx polyps, fam hx colon ca  Has patient had this procedure before?  Yes, 2019  If so, when, by whom and where?    Is there a family history of colon cancer?  Yes, uncles  Who?  What age when diagnosed?    Is patient diabetic? If yes, Type 1 or Type 2   no      Does patient have prosthetic heart valve or mechanical valve?  no  Do you have a pacemaker/defibrillator?  no  Has patient ever had endocarditis/atrial fibrillation? no  Does patient use oxygen? no  Has patient had joint replacement within last 12 months?  no  Is patient constipated or do they take laxatives? no  Does patient have a history of alcohol/drug use?  no  Have you had a stroke/heart attack last 6 mths? no  Do you take medicine for weight loss?  no  For female patients,: do you still have your menstrual cycle? no  Is patient on blood thinner such as Coumadin, Plavix and/or Aspirin? yes  Medications: asa 81 mg daily, lasix 20 mg daily, losartan 50 mg daily, metoprolol 100 mg bid, atorvastatin 80 mg daily, colon health daily, one a day womens vit daily, vit d3 daily, prevagan daily, prilosec otc daily, benefiber daily, melatonin 5 mg daily, tylenol prn  Allergies: demerol  Medication Adjustment per Dr Rehman/Dr Jenetta Downer asa 2 days  Procedure date & time: 10/16/20

## 2020-10-02 ENCOUNTER — Telehealth (INDEPENDENT_AMBULATORY_CARE_PROVIDER_SITE_OTHER): Payer: Self-pay

## 2020-10-02 NOTE — Telephone Encounter (Signed)
LeighAnn Dao Memmott, CMA  

## 2020-10-14 ENCOUNTER — Other Ambulatory Visit (HOSPITAL_COMMUNITY): Payer: Medicare Other

## 2020-10-16 ENCOUNTER — Encounter (HOSPITAL_COMMUNITY)
Admission: RE | Disposition: A | Discharge status: Home / Self Care | Payer: Self-pay | Source: Home / Self Care | Attending: Internal Medicine

## 2020-10-16 ENCOUNTER — Encounter (HOSPITAL_COMMUNITY): Payer: Self-pay | Admitting: Internal Medicine

## 2020-10-16 ENCOUNTER — Other Ambulatory Visit: Payer: Self-pay

## 2020-10-16 ENCOUNTER — Ambulatory Visit (HOSPITAL_COMMUNITY)
Admission: RE | Admit: 2020-10-16 | Discharge: 2020-10-16 | Disposition: A | Discharge status: Home / Self Care | Payer: Medicare Other | Attending: Internal Medicine | Admitting: Internal Medicine

## 2020-10-16 DIAGNOSIS — Z885 Allergy status to narcotic agent status: Secondary | ICD-10-CM | POA: Diagnosis not present

## 2020-10-16 DIAGNOSIS — N189 Chronic kidney disease, unspecified: Secondary | ICD-10-CM | POA: Insufficient documentation

## 2020-10-16 DIAGNOSIS — Z85038 Personal history of other malignant neoplasm of large intestine: Secondary | ICD-10-CM | POA: Insufficient documentation

## 2020-10-16 DIAGNOSIS — D124 Benign neoplasm of descending colon: Secondary | ICD-10-CM | POA: Diagnosis not present

## 2020-10-16 DIAGNOSIS — Z800 Family history of malignant neoplasm of digestive organs: Secondary | ICD-10-CM | POA: Insufficient documentation

## 2020-10-16 DIAGNOSIS — I509 Heart failure, unspecified: Secondary | ICD-10-CM | POA: Diagnosis not present

## 2020-10-16 DIAGNOSIS — Z1211 Encounter for screening for malignant neoplasm of colon: Secondary | ICD-10-CM | POA: Insufficient documentation

## 2020-10-16 DIAGNOSIS — Z7952 Long term (current) use of systemic steroids: Secondary | ICD-10-CM | POA: Diagnosis not present

## 2020-10-16 DIAGNOSIS — Z86010 Personal history of colonic polyps: Secondary | ICD-10-CM

## 2020-10-16 DIAGNOSIS — Z7982 Long term (current) use of aspirin: Secondary | ICD-10-CM | POA: Insufficient documentation

## 2020-10-16 DIAGNOSIS — D123 Benign neoplasm of transverse colon: Secondary | ICD-10-CM | POA: Insufficient documentation

## 2020-10-16 DIAGNOSIS — Z980 Intestinal bypass and anastomosis status: Secondary | ICD-10-CM | POA: Insufficient documentation

## 2020-10-16 DIAGNOSIS — Z888 Allergy status to other drugs, medicaments and biological substances status: Secondary | ICD-10-CM | POA: Insufficient documentation

## 2020-10-16 DIAGNOSIS — Z87891 Personal history of nicotine dependence: Secondary | ICD-10-CM | POA: Diagnosis not present

## 2020-10-16 DIAGNOSIS — Z09 Encounter for follow-up examination after completed treatment for conditions other than malignant neoplasm: Secondary | ICD-10-CM | POA: Diagnosis not present

## 2020-10-16 DIAGNOSIS — K621 Rectal polyp: Secondary | ICD-10-CM | POA: Diagnosis not present

## 2020-10-16 DIAGNOSIS — Z79899 Other long term (current) drug therapy: Secondary | ICD-10-CM | POA: Insufficient documentation

## 2020-10-16 DIAGNOSIS — K5730 Diverticulosis of large intestine without perforation or abscess without bleeding: Secondary | ICD-10-CM

## 2020-10-16 HISTORY — PX: COLONOSCOPY: SHX5424

## 2020-10-16 HISTORY — PX: POLYPECTOMY: SHX5525

## 2020-10-16 SURGERY — COLONOSCOPY
Anesthesia: Moderate Sedation

## 2020-10-16 MED ORDER — SODIUM CHLORIDE 0.9 % IV SOLN
INTRAVENOUS | Status: DC
  Administered 2020-10-16: 1000 mL via INTRAVENOUS

## 2020-10-16 MED ORDER — MIDAZOLAM HCL 5 MG/5ML IJ SOLN
INTRAMUSCULAR | Status: AC
  Filled 2020-10-16: qty 10

## 2020-10-16 MED ORDER — FENTANYL CITRATE (PF) 100 MCG/2ML IJ SOLN
INTRAMUSCULAR | Status: AC
  Filled 2020-10-16: qty 2

## 2020-10-16 MED ORDER — MIDAZOLAM HCL 5 MG/5ML IJ SOLN
INTRAMUSCULAR | Status: DC | PRN
  Administered 2020-10-16 (×2): 2 mg via INTRAVENOUS

## 2020-10-16 MED ORDER — STERILE WATER FOR IRRIGATION IR SOLN
Status: DC | PRN
  Administered 2020-10-16: 1.5 mL

## 2020-10-16 MED ORDER — FENTANYL CITRATE (PF) 100 MCG/2ML IJ SOLN
INTRAMUSCULAR | Status: DC | PRN
  Administered 2020-10-16 (×2): 25 ug via INTRAVENOUS

## 2020-10-16 NOTE — Discharge Instructions (Addendum)
Resume aspirin on 10/18/2020 Resume other medications as before. Resume usual diet. No driving for 24 hours. Physician will call with biopsy results.

## 2020-10-16 NOTE — H&P (Signed)
Erin Cooke is an 80 y.o. female.   Chief Complaint: Patient is here for colonoscopy. 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  She has done well.  She has had few sessile serrated polyps removed since then.  Last exam was 3 years ago.  She denies abdominal pain melena or rectal bleeding.  Family history is positive for colon carcinoma to colon carcinoma in 2 maternal uncles.  Past Medical History:  Diagnosis Date   Arthritis    Cancer ([place])    breast   CHF (congestive heart failure) (HCC)    Chronic back pain    synovial cyst,radiculopathy,and stenosis   CKD (chronic kidney disease)    Colon cancer (HCC)    Coronary artery disease    Dyspnea    Erosive esophagitis    Fatty liver    GERD (gastroesophageal reflux disease)    takes OTC Prilosec daily   History of colon polyps    History of ischemic cardiomyopathy    Hyperlipidemia    takes Atorvastatin daily   Microscopic colitis    Mitral regurgitation    mild-moderate MR 02/10/18 [facility]   Myocardial infarction ([place]) 1989, 2017   Palpitations    takes betapace daily   PONV (postoperative nausea and vomiting)    states Forane elevated her liver enzymes   Skin cancer    (8) squamous cells   Stress incontinence    Thyroid nodule    benign    Past Surgical History:  Procedure Laterality Date   ANKLE SURGERY Right 1990   pins and plate   APPENDECTOMY     BACK SURGERY  2011   BIOPSY THYROID     CARDIAC CATHETERIZATION  1989/1999   CARPAL TUNNEL RELEASE Right 2004   cataract surgery with corneal transplant  Right 2008   cataract surgery with corneal transplant  Left 2014   COLON SURGERY  2017   COLONOSCOPY     COLONOSCOPY N/A 05/01/2015   Procedure: COLONOSCOPY;  Surgeon: Rogene Houston, MD;  [facility];  Service: Endoscopy;  Laterality: N/A;    COLONOSCOPY N/A 07/28/2016   Procedure: COLONOSCOPY;  Surgeon: Rogene Houston, MD;  [facility];  Service: Endoscopy;  Laterality: N/A;  830 - moved to 4/11 @ 12:00   COLONOSCOPY N/A 08/18/2017   Procedure: COLONOSCOPY;  Surgeon: Rogene Houston, MD;  [facility];  Service: Endoscopy;  Laterality: N/A;  41   CORONARY ANGIOPLASTY     2 stents   DILATION AND CURETTAGE OF UTERUS  2014   ESOPHAGOGASTRODUODENOSCOPY     ESOPHAGOGASTRODUODENOSCOPY N/A 07/28/2016   Procedure: ESOPHAGOGASTRODUODENOSCOPY (EGD);  Surgeon: Rogene Houston, MD;  [facility];  Service: Endoscopy;  Laterality: N/A;   eye lid surgery Bilateral 2014   LUMBAR WOUND DEBRIDEMENT N/A 02/10/2019   Procedure: LUMBAR WOUND DEBRIDEMENT;  Surgeon: Ashok Pall, MD;  Location: [facility];  Service: Neurosurgery;  Laterality: N/A;  LUMBAR WOUND DEBRIDEMENT   MASTECTOMY, PARTIAL Right 2013   POLYPECTOMY  08/18/2017   Procedure: POLYPECTOMY;  Surgeon: Rogene Houston, MD;  [facility]: [facility];  Service: Endoscopy;;  transverse   ROTATOR CUFF REPAIR Left 1989   [place] Right 2001   SPINAL FUSION  2012   TOTAL ANKLE REPLACEMENT Right 2012    Family History  Problem Relation Age of  Onset   Colon cancer Other    Social History:  reports that she has quit smoking. She has a 2.50 pack-year smoking history. She has never used smokeless tobacco. She reports that she does not drink alcohol and does not use drugs.  Allergies:  Allergies  Allergen Reactions   Demerol [Meperidine Hcl] Nausea And Vomiting   Forane [Isoflurane] Other (See Comments)    Elevated liver enzymes   Letrozole Other (See Comments)    Muscle pain    Medications Prior to Admission  Medication Sig Dispense Refill   acetaminophen (TYLENOL) 500 MG tablet Take 1,000 mg by mouth 2 (two) times daily as needed for moderate pain or headache.     aspirin EC 81 MG tablet Take 81 mg by mouth every evening.      atorvastatin  (LIPITOR) 80 MG tablet Take 80 mg by mouth every evening.      furosemide (LASIX) 20 MG tablet Take 20 mg by mouth every other day. In the morning     losartan (COZAAR) 50 MG tablet Take 50 mg by mouth in the morning.     metoprolol (LOPRESSOR) 100 MG tablet Take 100 mg by mouth 2 (two) times daily.     Multiple Vitamin (MULTIVITAMIN WITH MINERALS) TABS tablet Take 1 tablet by mouth in the morning. Women's Multivitamin     nitroGLYCERIN (NITROSTAT) 0.4 MG SL tablet Place 0.4 mg under the tongue every 5 (five) minutes x 3 doses as needed for chest pain.     omeprazole (PRILOSEC OTC) 20 MG tablet Take 20 mg by mouth every morning.     Polyethyl Glycol-Propyl Glycol (SYSTANE) 0.4-0.3 % GEL ophthalmic gel Place 1 application into both eyes 4 (four) times daily.     prednisoLONE acetate (PRED FORTE) 1 % ophthalmic suspension Place 1 drop into both eyes in the morning.     predniSONE (DELTASONE) 5 MG tablet Take 5 mg by mouth daily as needed for pain. with food     Probiotic Product (PHILLIPS COLON HEALTH PO) Take 1 capsule by mouth in the morning.     SUTAB [phone] MG TABS as directed.     Wheat Dextrin (BENEFIBER PO) Take 1 Dose by mouth every evening.     White Petrolatum-Mineral Oil (SYSTANE NIGHTTIME) OINT Place 1 application into both eyes at bedtime.     loperamide (IMODIUM) 2 MG capsule Take 4 mg by mouth 4 (four) times daily as needed for diarrhea or loose stools.       No results found for this or any previous visit (from the past 48 hour(s)). No results found.  Review of Systems  Blood pressure (!) 159/82, pulse (!) 116, temperature 97.8 F (36.6 C), temperature source Oral, resp. rate 16, height 5\' 1"  (1.549 m), weight 93.9 kg, SpO2 97 %. Physical Exam HENT:     Mouth/Throat:     Mouth: Mucous membranes are moist.     Pharynx: Oropharynx is clear.  Eyes:     General: No scleral icterus.    Conjunctiva/sclera: Conjunctivae normal.  Cardiovascular:     Rate and Rhythm:  Normal rate and regular rhythm.     Heart sounds: Normal heart sounds. No murmur heard. Pulmonary:     Effort: Pulmonary effort is normal.     Breath sounds: Normal breath sounds.  Abdominal:     Comments: Abdomen is full.  She has small lower midline scar.  On palpation is soft and nontender without organomegaly or masses.  Musculoskeletal:  General: Swelling present.     Cervical back: Neck supple.     Comments: Trace edema around right ankle  Lymphadenopathy:     Cervical: No cervical adenopathy.  Skin:    General: Skin is warm and dry.  Neurological:     Mental Status: She is alert.     Assessment/Plan  History of colon carcinoma and multiple colonic polyps. Surveillance colonoscopy.  Hildred Laser, MD 10/16/2020, 12:13 PM

## 2020-10-16 NOTE — Op Note (Signed)
[facility] Patient Name: Erin Cooke Procedure Date: 10/16/2020 11:52 AM MRN: [id number] Date of Birth: [birth year] Attending MD: Hildred Laser , MD CSN: [id number] Age: 80 Admit Type: Outpatient Procedure:                Colonoscopy Indications:              High risk colon cancer surveillance: Personal                            history of colon cancer Providers:                Hildred Laser, MD, Lambert Mody, Aram Candela Referring MD:             Renee Ramus, MD Medicines:                Fentanyl 50 micrograms IV, Midazolam 4 mg IV Complications:            No immediate complications. Estimated Blood Loss:     Estimated blood loss was minimal. Estimated blood                            loss was minimal. Procedure:                Pre-Anesthesia Assessment:                           - Prior to the procedure, a History and Physical                            was performed, and patient medications and                            allergies were reviewed. The patient's tolerance of                            previous anesthesia was also reviewed. The risks                            and benefits of the procedure and the sedation                            options and risks were discussed with the patient.                            All questions were answered, and informed consent                            was obtained. Prior Anticoagulants: The patient has                            taken no previous anticoagulant or antiplatelet                            agents except for aspirin. ASA Grade Assessment:  III - A patient with severe systemic disease. After                            reviewing the risks and benefits, the patient was                            deemed in satisfactory condition to undergo the                            procedure.                           After obtaining informed consent, the colonoscope                             was passed under direct vision. Throughout the                            procedure, the patient's blood pressure, pulse, and                            oxygen saturations were monitored continuously. The                            [id number]) was introduced through the and                            advanced to the the ileocolonic anastomosis. The                            colonoscopy was performed without difficulty. The                            patient tolerated the procedure well. The quality                            of the bowel preparation was good. The terminal                            ileum and the rectum were photographed. Scope In: 12:29:23 PM Scope Out: 12:55:25 PM Scope Withdrawal Time: 0 hours 12 minutes 42 seconds  Total Procedure Duration: 0 hours 26 minutes 2 seconds  Findings:      The perianal and digital rectal examinations were normal.      The ileum appeared normal.      There was evidence of a prior end-to-end ileo-colonic anastomosis at the       hepatic flexure. This was patent. The anastomosis was traversed.      Seven polyps were found in the splenic flexure and transverse colon. The       polyps were 4 to 7 mm in size. These polyps were removed with a cold       snare. Resection and retrieval were complete. The pathology specimen was       placed into Bottle Number 1.      A small polyp was found in  the descending colon. Biopsies were taken       with a cold forceps for histology. The pathology specimen was placed       into Bottle Number 1.      A small polyp was found in the splenic flexure. The polyp was sessile.       The polyp was removed with a cold snare. Resection and retrieval were       complete. The pathology specimen was placed into Bottle Number 2.      Three polyps were found in the rectum and descending colon. The polyps       were 4 to 7 mm in size. These polyps were removed with a cold snare.       Resection and retrieval  were complete. The pathology specimen was placed       into Bottle Number 3.      A few diverticula were found in the sigmoid colon.      External hemorrhoids were found during retroflexion. The hemorrhoids       were small. Impression:               - The terminal ileum is normal.                           - Patent end-to-end ileo-colonic anastomosis.                           - Seven 4 to 7 mm polyps at the splenic flexure and                            in the transverse colon, removed with a cold snare.                            Resected and retrieved.                           - One small polyp in the descending colon. Biopsied.                           - One small polyp at the splenic flexure, removed                            with a cold snare. Resected and retrieved.                           - Three 4 to 7 mm polyps in the rectum and in the                            descending colon, removed with a cold snare.                            Resected and retrieved. Moderate Sedation:      Moderate (conscious) sedation was administered by the endoscopy nurse       and supervised by the endoscopist. The following parameters were       monitored: oxygen saturation, heart rate, blood pressure, CO2       capnography  and response to care. Total physician intraservice time was       31 minutes. Recommendation:           - Patient has a contact number available for                            emergencies. The signs and symptoms of potential                            delayed complications were discussed with the                            patient. Return to normal activities tomorrow.                            Written discharge instructions were provided to the                            patient.                           - Resume previous diet today.                           - Continue present medications.                           - No aspirin, ibuprofen, naproxen, or other                             non-steroidal anti-inflammatory drugs for 3 days.                           - Await pathology results.                           - Repeat colonoscopy is recommended. The                            colonoscopy date will be determined after pathology                            results from today's exam become available for                            review. Procedure Code(s):        --- Professional ---                           [id number], Colonoscopy, flexible; with removal of                            tumor(s), polyp(s), or other lesion(s) by snare                            technique  28768, 59, Colonoscopy, flexible; with biopsy,                            single or multiple                           99153, Moderate sedation; each additional 15                            minutes intraservice time                           G0500, Moderate sedation services provided by the                            same physician or other qualified health care                            professional performing a gastrointestinal                            endoscopic service that sedation supports,                            requiring the presence of an independent trained                            observer to assist in the monitoring of the                            patient's level of consciousness and physiological                            status; initial 15 minutes of intra-service time;                            patient age 44 years or older (additional time may                            be reported with [phone], as appropriate) Diagnosis Code(s):        --- Professional ---                           [phone], Personal history of other malignant                            neoplasm of large intestine                           Z98.0, Intestinal bypass and anastomosis status                           K62.1, Rectal polyp                           K63.5, Polyp of  colon  CPT copyright 2019 [employer]. All rights reserved. The codes documented in this report are preliminary and upon coder review may  be revised to meet current compliance requirements. Hildred Laser, MD Hildred Laser, MD 10/16/2020 1:11:30 PM This report has been signed electronically. Number of Addenda: 0

## 2020-10-17 LAB — SURGICAL PATHOLOGY

## 2020-10-21 ENCOUNTER — Other Ambulatory Visit (INDEPENDENT_AMBULATORY_CARE_PROVIDER_SITE_OTHER): Payer: Self-pay | Admitting: Internal Medicine

## 2020-10-23 ENCOUNTER — Encounter (HOSPITAL_COMMUNITY): Payer: Self-pay | Admitting: Internal Medicine

## 2021-03-20 ENCOUNTER — Encounter (INDEPENDENT_AMBULATORY_CARE_PROVIDER_SITE_OTHER): Payer: Self-pay

## 2023-05-21 DEATH — deceased
